# Patient Record
Sex: Female | Born: 1990 | Hispanic: Yes | Marital: Single | Smoking: Former smoker
Health system: Southern US, Community
[De-identification: ages and names within clinical notes are randomized; demographics above are authoritative.]

## PROBLEM LIST (undated history)

## (undated) DIAGNOSIS — Z862 Personal history of diseases of the blood and blood-forming organs and certain disorders involving the immune mechanism: Secondary | ICD-10-CM

## (undated) DIAGNOSIS — J45909 Unspecified asthma, uncomplicated: Secondary | ICD-10-CM

## (undated) DIAGNOSIS — Z8709 Personal history of other diseases of the respiratory system: Secondary | ICD-10-CM

## (undated) DIAGNOSIS — Z87440 Personal history of urinary (tract) infections: Secondary | ICD-10-CM

## (undated) DIAGNOSIS — F419 Anxiety disorder, unspecified: Secondary | ICD-10-CM

## (undated) HISTORY — PX: ROOT CANAL: SHX2363

## (undated) HISTORY — DX: Anxiety disorder, unspecified: F41.9

## (undated) HISTORY — DX: Personal history of diseases of the blood and blood-forming organs and certain disorders involving the immune mechanism: Z86.2

## (undated) HISTORY — DX: Personal history of other diseases of the respiratory system: Z87.09

## (undated) HISTORY — DX: Personal history of urinary (tract) infections: Z87.440

---
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
## 2012-02-07 ENCOUNTER — Emergency Department: Payer: Self-pay | Admitting: Emergency Medicine

## 2012-06-22 ENCOUNTER — Emergency Department: Payer: Self-pay | Admitting: Emergency Medicine

## 2012-06-22 LAB — URINALYSIS, COMPLETE
Bacteria: NONE SEEN
Glucose,UR: NEGATIVE mg/dL (ref 0–75)
Nitrite: NEGATIVE
Protein: 30
RBC,UR: 278 /HPF (ref 0–5)
Specific Gravity: 1.026 (ref 1.003–1.030)
Squamous Epithelial: 1
WBC UR: 19 /HPF (ref 0–5)

## 2012-06-22 LAB — PREGNANCY, URINE: Pregnancy Test, Urine: NEGATIVE m[IU]/mL

## 2012-06-23 LAB — CBC WITH DIFFERENTIAL/PLATELET
Basophil %: 0.8 %
HCT: 32.9 % — ABNORMAL LOW (ref 35.0–47.0)
HGB: 10.6 g/dL — ABNORMAL LOW (ref 12.0–16.0)
Lymphocyte %: 37.8 %
MCH: 23 pg — ABNORMAL LOW (ref 26.0–34.0)
Monocyte %: 7.3 %
Neutrophil #: 2.4 10*3/uL (ref 1.4–6.5)
Neutrophil %: 39.2 %
WBC: 6.1 10*3/uL (ref 3.6–11.0)

## 2012-06-23 LAB — BASIC METABOLIC PANEL
Calcium, Total: 8.8 mg/dL — ABNORMAL LOW (ref 9.0–10.7)
Co2: 26 mmol/L — ABNORMAL HIGH (ref 16–25)
EGFR (African American): >60
EGFR (Non-African Amer.): >60
Glucose: 88 mg/dL (ref 65–99)
Potassium: 3.3 mmol/L (ref 3.3–4.7)
Sodium: 139 mmol/L (ref 132–141)

## 2013-01-20 IMAGING — CT CT STONE STUDY
1 of 2 series · 15 of 32 positions shown, 20 images · non-contrast
Comparison: none

REASON FOR EXAM: pelvic pain with hematuria
COMMENTS:   LMP: > one month ago

[Series 2: 3mm soft tissue · axial · 0.60mm/px · z∈[-638,-276]mm · 15 of 133 slices shown, 20 images]
[im 6/133  soft-tissue]
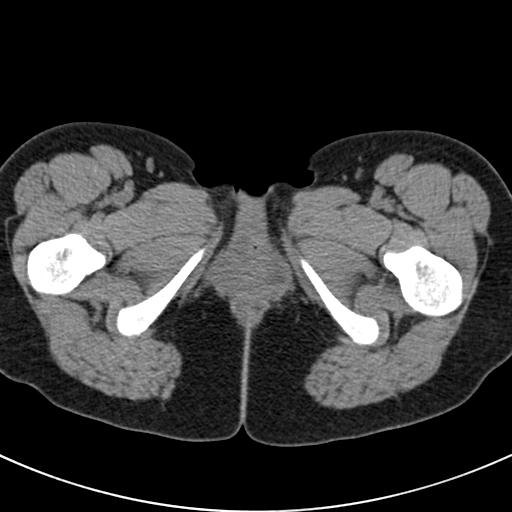
[im 6/133  bone]
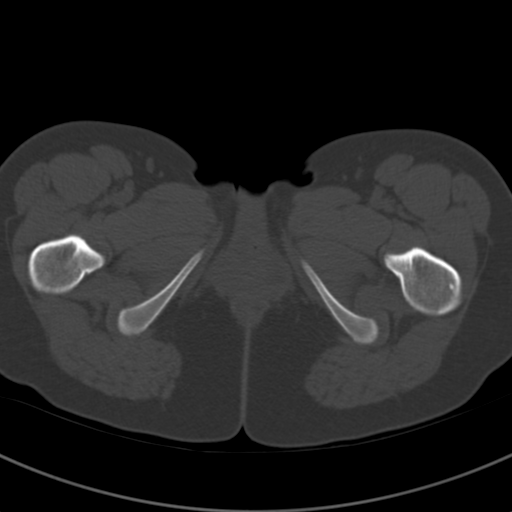
[im 18/133  soft-tissue]
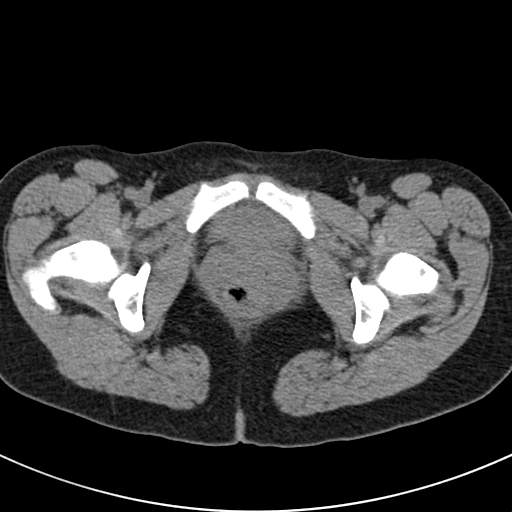
[im 23/133  soft-tissue]
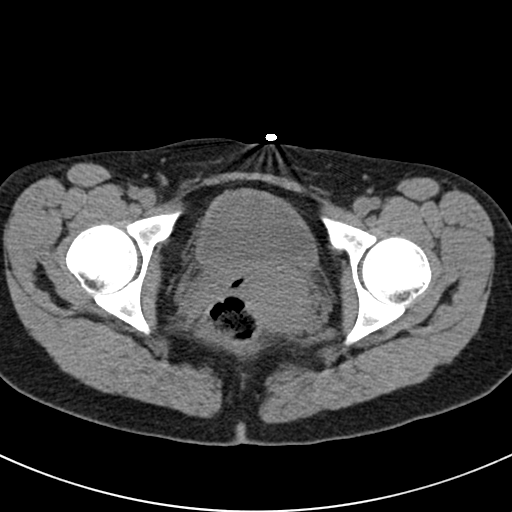
[im 35/133  soft-tissue]
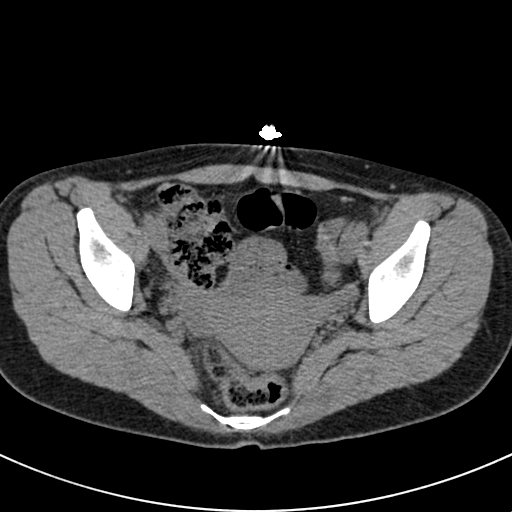
[im 46/133  soft-tissue]
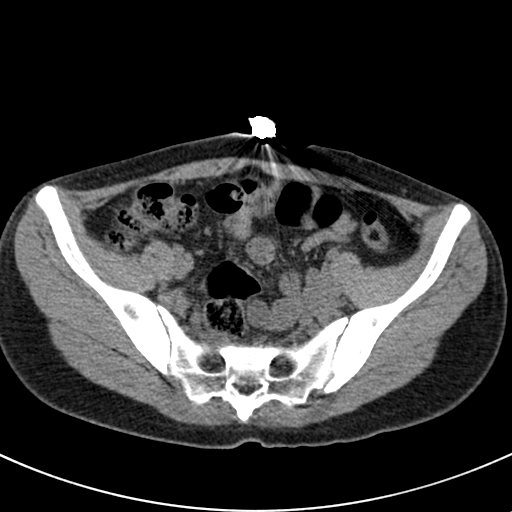
[im 52/133  soft-tissue]
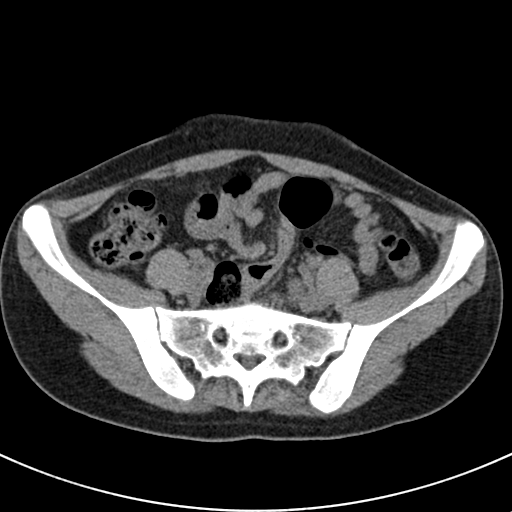
[im 64/133  soft-tissue]
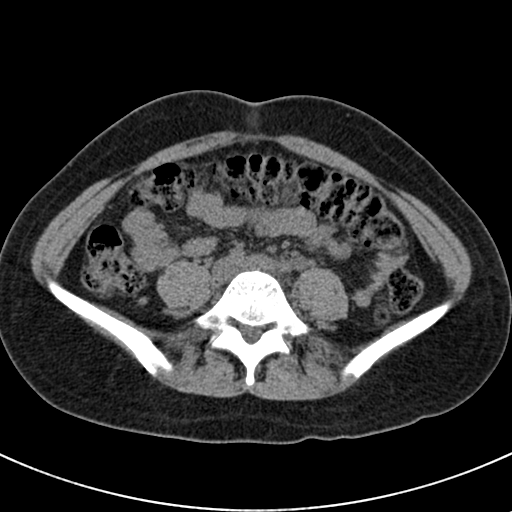
[im 69/133  soft-tissue]
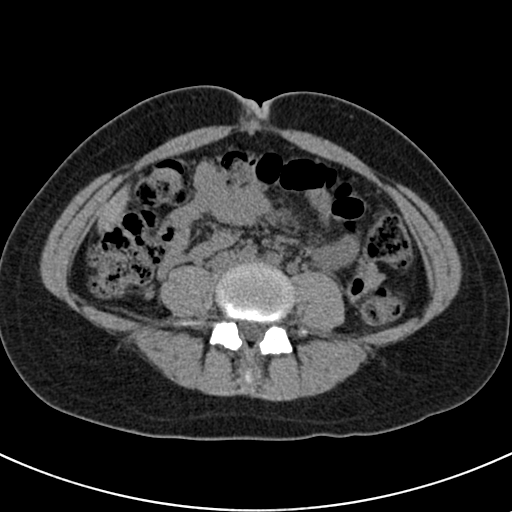
[im 81/133  soft-tissue]
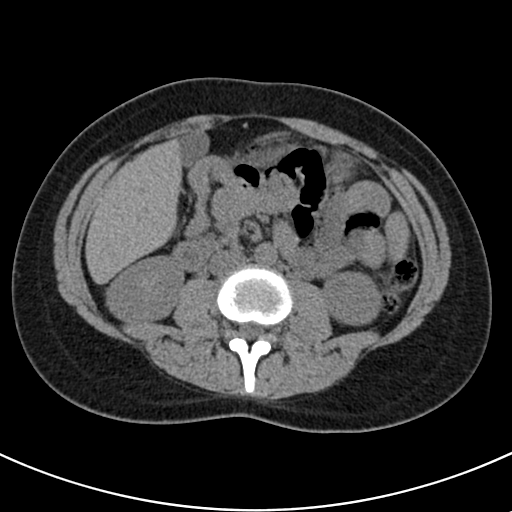
[im 81/133  bone]
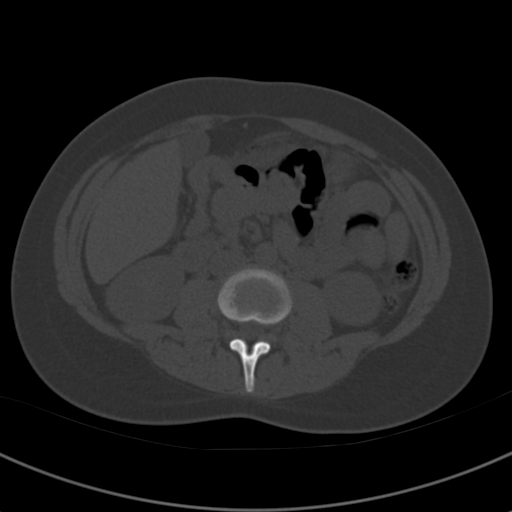
[im 87/133  soft-tissue]
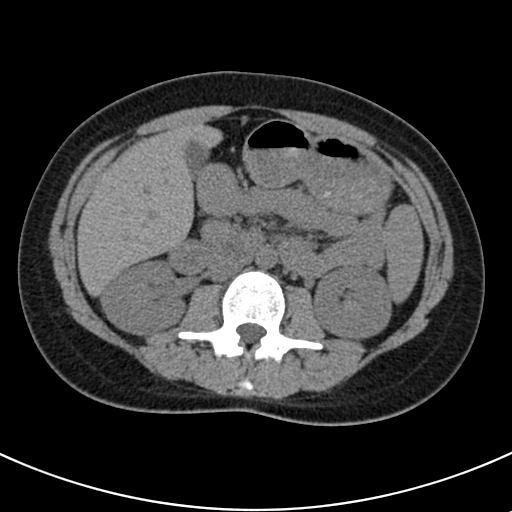
[im 98/133  soft-tissue]
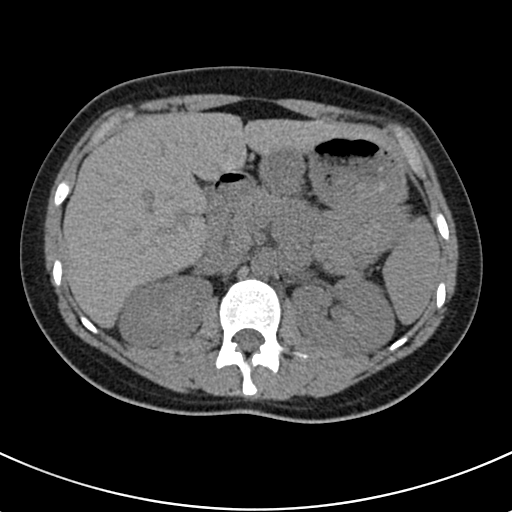
[im 110/133  soft-tissue]
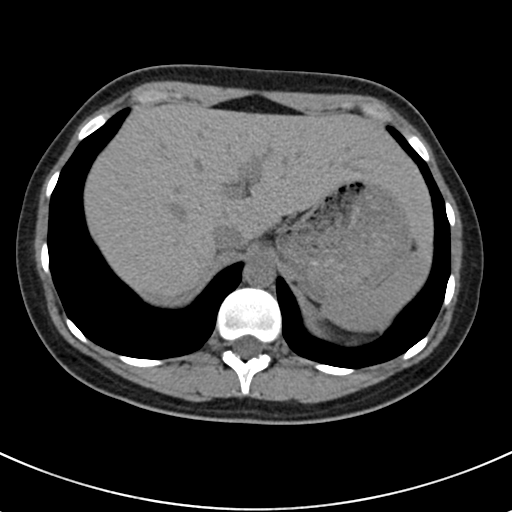
[im 110/133  lung]
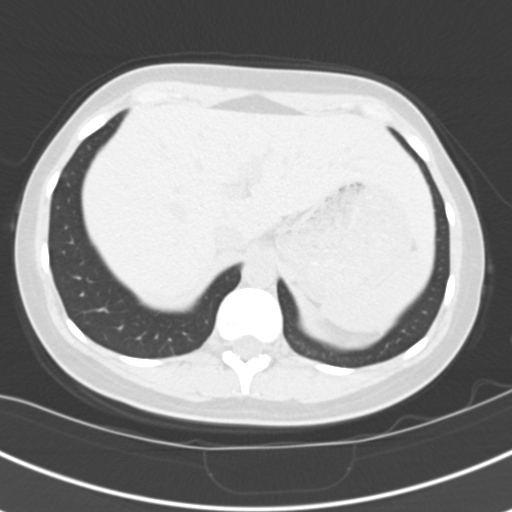
[im 115/133  soft-tissue]
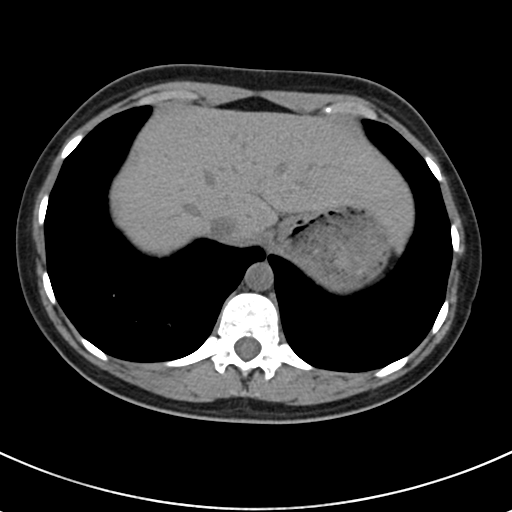
[im 115/133  lung]
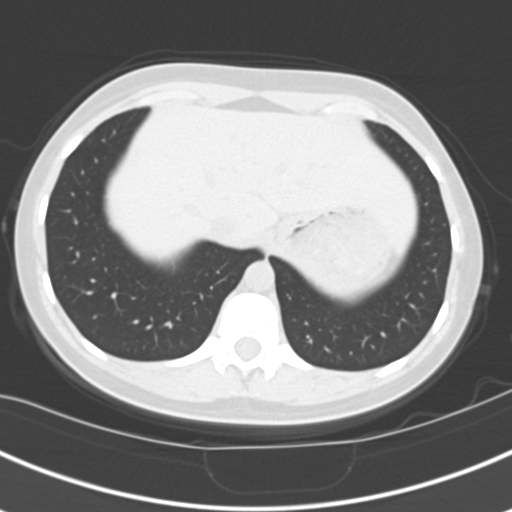
[im 121/133  lung]
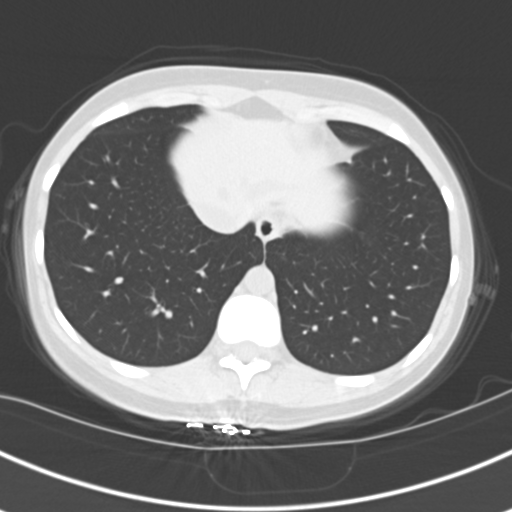
[im 127/133  soft-tissue]
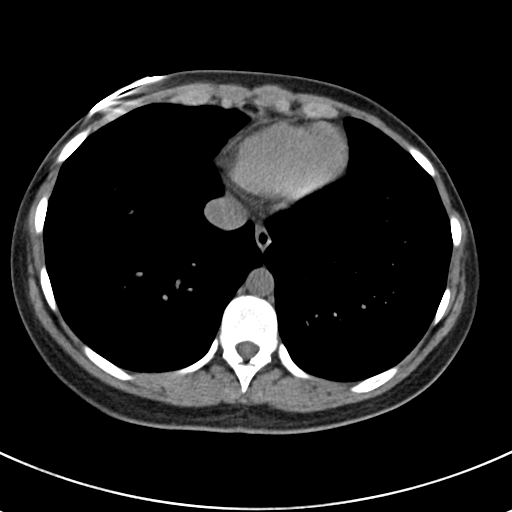
[im 127/133  lung]
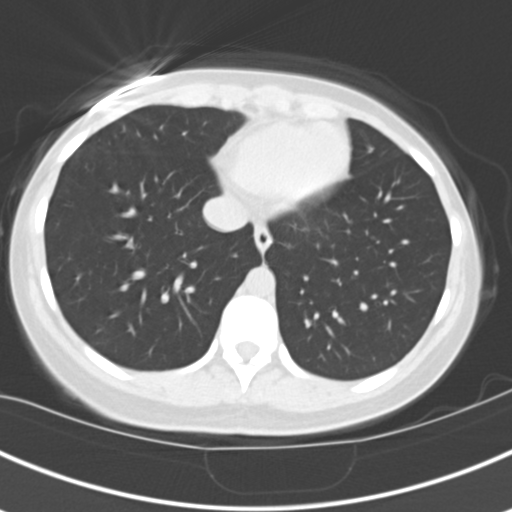

[15 of 32 positions shown; findings below may reference images not displayed]

PROCEDURE:     CT  - CT ABDOMEN /PELVIS WO (STONE)  - June 23, 2012  [DATE]

RESULT:     History: Pain and hematuria.

Comparison Study: No recent. Findings: Liver normal. Spleen normal. Pancreas
normal. Adrenals normal. Mild fullness of renal pelvis noted bilaterally. No
obstructing ureteral stone identified. Bladder nondistended. Uterus and
adnexa unremarkable. Bartholin's gland cyst of the left perineum. Stool in
colon. Appendix normal. No inflammatory changes left lower quadrant.
Gallbladder nondistended. No free air. Aorta unremarkable. Lung bases clear.
No free air.
IMPRESSION: No acute abnormality identified. Mild distention of renal
pelves noted. Bartholin's gland cyst left peroneum.

## 2016-12-01 ENCOUNTER — Emergency Department
Admission: EM | Admit: 2016-12-01 | Discharge: 2016-12-01 | Disposition: A | Discharge status: Home / Self Care | Payer: Self-pay | Attending: Emergency Medicine | Admitting: Emergency Medicine

## 2016-12-01 DIAGNOSIS — F410 Panic disorder [episodic paroxysmal anxiety] without agoraphobia: Secondary | ICD-10-CM | POA: Insufficient documentation

## 2016-12-01 MED ORDER — FERROUS SULFATE DRIED ER 160 (50 FE) MG PO TBCR: 160.0000 mg | EXTENDED_RELEASE_TABLET | ORAL | 6 refills | Status: DC

## 2016-12-01 MED ORDER — LORAZEPAM 0.5 MG PO TABS: 0.5000 mg | ORAL_TABLET | Freq: Three times a day (TID) | ORAL | 0 refills | Status: AC | PRN

## 2016-12-01 NOTE — ED Triage Notes (Signed)
Pt states she was at work today and felt like her heart was racing and felt SOB, states she then began having tingling to hands feet and face with hand cramping.. States she has had increased stress with starting a 2nd job . States same thing happened last month when she was going through custody battle of her child.Marland Kitchen

## 2016-12-01 NOTE — ED Provider Notes (Addendum)
[facility] Emergency Department Provider Note       Time seen: ----------------------------------------- 1:21 PM on 12/01/2016 -----------------------------------------     I have reviewed the triage vital signs and the nursing notes.   HISTORY   Chief Complaint Panic Attack    HPI Natasha Sloan is a 26 y.o. female who presents to the ED for heart racing and shortness of breath. Patient states she began having tingling in her hands and feet and face with cramping. Patient reports she's had increased stress with starting a second job. The same thing happened last month when she went through a custody battle with her child. Currently she is feeling better, she presents for further evaluation. Recently she is had very poor sleep at night.   History reviewed. No pertinent past medical history.  There are no active problems to display for this patient.   History reviewed. No pertinent surgical history.  Allergies Patient has no known allergies.  Social History Social History  Substance Use Topics  . Smoking status: Never Smoker  . Smokeless tobacco: Never Used  . Alcohol use Yes    Review of Systems Constitutional: Negative for fever. Cardiovascular: Negative for chest pain.Positive for palpitations Respiratory: Positive for shortness of breath Gastrointestinal: Negative for abdominal pain, vomiting and diarrhea. Genitourinary: Negative for dysuria. Musculoskeletal: Negative for back pain.Positive for cramping Skin: Negative for rash. Neurological: Negative for headaches, focal weakness or numbness. Positive for paresthesias and tingling  10-point ROS otherwise negative.  ____________________________________________   PHYSICAL EXAM:  VITAL SIGNS: ED Triage Vitals  Enc Vitals Group     BP 12/01/16 1009 127/82     Pulse Rate 12/01/16 1009 100     Resp 12/01/16 1009 18     Temp 12/01/16 1009 97.9 F (36.6 C)     Temp Source 12/01/16  1009 Oral     SpO2 12/01/16 1009 100 %     Weight 12/01/16 1010 120 lb (54.4 kg)     Height 12/01/16 1010  (1.6 m)     Head Circumference --      Peak Flow --      Pain Score --      Pain Loc --      Pain Edu? --      Excl. in GC? --     Constitutional: Alert and oriented. Well appearing and in no distress. Eyes: Conjunctivae are normal. PERRL. Normal extraocular movements. ENT   Head: Normocephalic and atraumatic.   Nose: No congestion/rhinnorhea.   Mouth/Throat: Mucous membranes are moist.   Neck: No stridor. Cardiovascular: Normal rate, regular rhythm. No murmurs, rubs, or gallops. Respiratory: Normal respiratory effort without tachypnea nor retractions. Breath sounds are clear and equal bilaterally. No wheezes/rales/rhonchi. Gastrointestinal: Soft and nontender. Normal bowel sounds Musculoskeletal: Nontender with normal range of motion in extremities. No lower extremity tenderness nor edema. Neurologic:  Normal speech and language. No gross focal neurologic deficits are appreciated.  Skin:  Skin is warm, dry and intact. No rash noted. Psychiatric: Mood and affect are normal. Speech and behavior are normal.  ____________________________________________  EKG: Interpreted by me. Sinus rhythm with sinus arrhythmia, rate is 88 bpm, normal PR interval, normal QRS, normal QT.  ____________________________________________  ED COURSE:  Pertinent labs & imaging results that were available during my care of the patient were reviewed by me and considered in my medical decision making (see chart for details). Patient presents for anxiety and likely panic attack, we will assess with labs and imaging  as indicated.   Procedures  ____________________________________________  FINAL ASSESSMENT AND PLAN  Panic attack  Plan: Patient had presented for symptoms of a panic attack which are currently resolved. I will prescribe Ativan she can try as needed. She is stable for  outpatient follow-up.   Emily Filbert, MD   Note: This note was generated in part or whole with voice recognition software. Voice recognition is usually quite accurate but there are transcription errors that can and very often do occur. I apologize for any typographical errors that were not detected and corrected.     Emily Filbert, MD 12/01/16 1323    Emily Filbert, MD 12/01/16 [phone]

## 2016-12-01 NOTE — ED Notes (Signed)
Pt states she is feeling better .

## 2017-07-11 ENCOUNTER — Emergency Department: Payer: Self-pay

## 2017-07-11 ENCOUNTER — Emergency Department
Admission: EM | Admit: 2017-07-11 | Discharge: 2017-07-12 | Disposition: A | Discharge status: Home / Self Care | Payer: Self-pay | Attending: Emergency Medicine | Admitting: Emergency Medicine

## 2017-07-11 DIAGNOSIS — R29898 Other symptoms and signs involving the musculoskeletal system: Secondary | ICD-10-CM | POA: Insufficient documentation

## 2017-07-11 DIAGNOSIS — Z79899 Other long term (current) drug therapy: Secondary | ICD-10-CM | POA: Insufficient documentation

## 2017-07-11 DIAGNOSIS — R064 Hyperventilation: Secondary | ICD-10-CM | POA: Insufficient documentation

## 2017-07-11 DIAGNOSIS — F419 Anxiety disorder, unspecified: Secondary | ICD-10-CM | POA: Insufficient documentation

## 2017-07-11 LAB — CBC WITH DIFFERENTIAL/PLATELET
BASOS ABS: 0 10*3/uL (ref 0–0.1)
BASOS PCT: 1 %
EOS ABS: 0.2 10*3/uL (ref 0–0.7)
EOS PCT: 4 %
HCT: 36.4 % (ref 35.0–47.0)
Hemoglobin: 12 g/dL (ref 12.0–16.0)
Lymphocytes Relative: 42 %
Lymphs Abs: 2.5 10*3/uL (ref 1.0–3.6)
MCH: 25 pg — ABNORMAL LOW (ref 26.0–34.0)
MCHC: 33 g/dL (ref 32.0–36.0)
MCV: 75.8 fL — ABNORMAL LOW (ref 80.0–100.0)
MONO ABS: 0.6 10*3/uL (ref 0.2–0.9)
Monocytes Relative: 10 %
Neutro Abs: 2.5 10*3/uL (ref 1.4–6.5)
Neutrophils Relative %: 43 %
PLATELETS: 262 10*3/uL (ref 150–440)
RBC: 4.8 MIL/uL (ref 3.80–5.20)
RDW: 16.8 % — AB (ref 11.5–14.5)
WBC: 5.9 10*3/uL (ref 3.6–11.0)

## 2017-07-11 LAB — BASIC METABOLIC PANEL
Anion gap: 10 (ref 5–15)
BUN: 16 mg/dL (ref 6–20)
CALCIUM: 8.9 mg/dL (ref 8.9–10.3)
CO2: 24 mmol/L (ref 22–32)
Chloride: 103 mmol/L (ref 101–111)
Creatinine, Ser: 0.7 mg/dL (ref 0.44–1.00)
GFR calc Af Amer: >60 mL/min (ref >60)
GLUCOSE: 96 mg/dL (ref 65–99)
Potassium: 3.5 mmol/L (ref 3.5–5.1)
SODIUM: 137 mmol/L (ref 135–145)

## 2017-07-11 LAB — HCG, QUANTITATIVE, PREGNANCY

## 2017-07-11 MED ORDER — LORAZEPAM 2 MG/ML IJ SOLN
INTRAMUSCULAR | Status: AC
  Administered 2017-07-11: 1 mg
  Filled 2017-07-11: qty 1

## 2017-07-11 MED ORDER — LORAZEPAM 2 MG/ML IJ SOLN
1.0000 mg | Freq: Once | INTRAMUSCULAR | Status: AC
  Administered 2017-07-11: 1 mg via INTRAVENOUS
  Filled 2017-07-11: qty 1

## 2017-07-11 MED ORDER — SODIUM CHLORIDE 0.9 % IV BOLUS (SEPSIS)
1000.0000 mL | Freq: Once | INTRAVENOUS | Status: AC
  Administered 2017-07-11: 1000 mL via INTRAVENOUS

## 2017-07-11 NOTE — ED Notes (Addendum)
Pt reports some improvement in symptoms. This RN is able to understand some speech. Jaw is still clenched and pt is still twitching lower jaw. Family at bedside.  Pt denies taking any other meds today. Pt took lorazepam in the past but finished it. Pt sometimes takes Benadryl.

## 2017-07-11 NOTE — ED Provider Notes (Addendum)
[facility] Emergency Department Provider Note ____________________________________________   First MD Initiated Contact with Patient 07/11/17 2127     (approximate)  I have reviewed the triage vital signs and the nursing notes.   HISTORY  Chief Complaint Weakness    HPI Natasha Sloan is a 26 y.o. female no significant past medical history who presents with weakness, acute onset within the last few hours, associated with and preceded by anxiety, and associated with rigidity.  Patient states that she began feeling anxious, and then became stiff, and had difficulty opening her mouth or speaking.  Patient reports numbness and tingling to her face and around her mouth, as well as to her extremities.  She denies chest pain, difficulty breathing, or headache.  She has a prior history of anxiety, but no prior episodes like this.   No past medical history on file.  There are no active problems to display for this patient.   No past surgical history on file.  Prior to Admission medications   Medication Sig Start Date End Date Taking? Authorizing Provider  ferrous sulfate (EQL SLOW RELEASE IRON) 160 (50 Fe) MG TBCR SR tablet Take 1 tablet (160 mg total) by mouth every other day. 12/01/16   Emily FilbertWilliams, Jonathan E, MD  LORazepam (ATIVAN) 0.5 MG tablet Take 1 tablet (0.5 mg total) by mouth every 8 (eight) hours as needed for anxiety. 12/01/16 12/01/17  Emily FilbertWilliams, Jonathan E, MD    Allergies Patient has no known allergies.  No family history on file.  Social History Social History   Tobacco Use  . Smoking status: Never Smoker  . Smokeless tobacco: Never Used  Substance Use Topics  . Alcohol use: Yes  . Drug use: Not on file    Review of Systems  Constitutional: No fever. Eyes: No visual changes. ENT: No sore throat. Cardiovascular: Denies chest pain. Respiratory: Denies shortness of breath. Gastrointestinal: No nausea, no vomiting.   Genitourinary:  Negative for flank pain.  Musculoskeletal: Negative for back pain. Skin: Negative for rash. Neurological: Negative for headache.   ____________________________________________   PHYSICAL EXAM:  VITAL SIGNS: ED Triage Vitals  Enc Vitals Group     BP 07/11/17 2127 (!) 157/85     Pulse Rate 07/11/17 2127 (!) 107     Resp 07/11/17 2127 (!) 24     Temp --      Temp src --      SpO2 07/11/17 2127 100 %     Weight 07/11/17 2126 140 lb (63.5 kg)     Height 07/11/17 2126 5\' 2"  (1.575 m)     Head Circumference --      Peak Flow --      Pain Score --      Pain Loc --      Pain Edu? --      Excl. in GC? --     Constitutional: Alert.  Anxious appearing. Eyes: Conjunctivae are normal.  EOMI.  PERRLA.  Head: Atraumatic. Nose: No congestion/rhinnorhea. Mouth/Throat: Mucous membranes are moist.   Neck: Normal range of motion.  Cardiovascular: Tachycardic, regular rhythm. Grossly normal heart sounds.  Good peripheral circulation. Respiratory: Normal respiratory effort.  No retractions. Lungs CTAB. Gastrointestinal: Abdomen nontender, no distention.  Genitourinary: No flank tenderness. Musculoskeletal:  Extremities warm and well perfused.  Neurologic:  Following all commands.  Motor intact in all extremities.  Patient holding extremities rigidly extended but is able to squeeze with hands and push with feet bilaterally.  No facial droop;  patient having some difficulty opening mouth fully and some dysarthria but moving facial muscle symmetrically.   Skin:  Skin is warm and dry. No rash noted. Psychiatric: Anxious appearing.    ____________________________________________   LABS (all labs ordered are listed, but only abnormal results are displayed)  Labs Reviewed  CBC WITH DIFFERENTIAL/PLATELET - Abnormal; Notable for the following components:      Result Value   MCV 75.8 (*)    MCH 25.0 (*)    RDW 16.8 (*)    All other components within normal limits  BASIC METABOLIC PANEL  HCG,  QUANTITATIVE, PREGNANCY  BLOOD GAS, VENOUS  POC URINE PREG, ED   ____________________________________________  EKG  ED ECG REPORT I, Dionne BucySebastian Montae Stager, the attending physician, personally viewed and interpreted this ECG.  Date: 07/12/2017 EKG Time: 12:11 Rate: 81 Rhythm: normal sinus rhythm QRS Axis: normal Intervals: normal ST/T Wave abnormalities: normal Narrative Interpretation: no evidence of acute ischemia  ____________________________________________  RADIOLOGY  CT head: No ICH or other acute findings ____________________________________________   PROCEDURES  Procedure(s) performed: No    Critical Care performed: No ____________________________________________   INITIAL IMPRESSION / ASSESSMENT AND PLAN / ED COURSE  Pertinent labs & imaging results that were available during my care of the patient were reviewed by me and considered in my medical decision making (see chart for details).  26 year old female with history of anxiety but no other significant past medical history presents with acute onset of weakness and rigidity, in the context of anxiety.  She endorses feeling anxious for some time, and then the other symptoms began.  She reports tingling and numbness around her mouth, and tingling in the extremities.  Review of past medical records in Epic is noncontributory.  No prior history of similar episodes.  On exam, vital signs are normal except for tachycardia, the patient is anxious appearing, and the neuro exam is as described above; the patient is holding her extremities rigidly extended, and appears to have some difficulty opening her mouth with resulting dysarthria, however she is able to voluntarily move all extremities, she is alert and following commands, and there is no facial asymmetry or any apparent weakness.  The presentation is most consistent with an acute anxiety attack, with likely resulting hyperventilation and hypocapnia that is causing  perioral numbness and extremity symptoms.  Differential also includes partial seizure, however given that all extremities involved the patient is alert and following commands, this is less likely.  There is no evidence of acute CVA again given that patient is moving all extremities symmetrically, has no change in mental status, and no facial droop.    Plan: I will treat symptomatically with IV Ativan, fluids, obtain labs including VBG, and in case this is a first-time new onset seizure will obtain a CT head and EKG as part of normal seizure workup.  If negative and symptoms resolve, likely d/c home.     ----------------------------------------- 11:53 PM on 07/11/2017 -----------------------------------------  Patient's speech returned to normal, and her rigidity improved.  Patient is now asleep but arousable.  Awaiting CT and labs.  ----------------------------------------- 12:29 AM on 07/12/2017 -----------------------------------------  Lab workup and CT are negative.  Patient is still somewhat sleepy after the Ativan.  We will watch her for a bit longer, and when she is awake and alert and able to ambulate unassisted we will likely discharge home if she remains asymptomatic.  Signed out to oncoming physician Dr. Zenda AlpersWebster.  ____________________________________________   FINAL CLINICAL IMPRESSION(S) / ED DIAGNOSES  Final diagnoses:  Anxiety  Hyperventilation  Rigidity      NEW MEDICATIONS STARTED DURING THIS VISIT:  This SmartLink is deprecated. Use AVSMEDLIST instead to display the medication list for a patient.   Note:  This document was prepared using Dragon voice recognition software and may include unintentional dictation errors.      Dionne Bucy, MD 07/12/17 0030

## 2017-07-11 NOTE — ED Triage Notes (Signed)
Pt taken to room one from triage. Pt appears to be having seizure like activity in lobby. Pt is not able to communicate to staff and has rigidity noted to all extremities. Family states pt with history of panic attacks, but no hyperventilation present before episode began. On arrival to room pt states she is scared and is able to follow commands intermittently. No loss of bowel or bladder noted.

## 2017-07-11 NOTE — ED Notes (Signed)
Pt back from CT. Pt sleeping.

## 2017-07-12 NOTE — ED Provider Notes (Signed)
-----------------------------------------   1:07 AM on 07/12/2017 -----------------------------------------   Blood pressure 132/80, pulse (!) 107, resp. rate (!) 23, height 5\' 2"  (1.575 m), weight 63.5 kg (140 lb), last menstrual period 07/11/2017, SpO2 100 %.  Assuming care from Dr. Marisa SeverinSiadecki.  In short, Natasha Sloan is a 26 y.o. female with a chief complaint of Weakness .  Refer to the original H&P for additional details.  The current plan of care is to reassess and disposition the patient.     The patient is feeling improved. She will be discharged home to follow up with her primary care physician.   Rebecka ApleyWebster, Consuela Widener P, MD 07/12/17 [phone]

## 2017-07-12 NOTE — Discharge Instructions (Signed)
Return to the emergency department for new, worsening, or persistent weakness, numbness, difficulty speaking or moving, severe headache, spasm or tremor, or any other new or worsening symptoms that concern you.  Follow-up with your primary care doctor within the next week.

## 2017-07-12 NOTE — ED Notes (Signed)
Pt family asking if they could take pt home. Pt is still drowsy. Will wake and answer questions. MD wants pt to wait about 30 minutes before d/c. Family verbalized understanding.

## 2017-12-13 ENCOUNTER — Emergency Department
Admission: EM | Admit: 2017-12-13 | Discharge: 2017-12-13 | Disposition: A | Discharge status: Home / Self Care | Payer: 59 | Attending: Emergency Medicine | Admitting: Emergency Medicine

## 2017-12-13 ENCOUNTER — Emergency Department: Payer: 59

## 2017-12-13 ENCOUNTER — Encounter: Payer: Self-pay | Admitting: *Deleted

## 2017-12-13 ENCOUNTER — Other Ambulatory Visit: Payer: Self-pay

## 2017-12-13 DIAGNOSIS — Y92009 Unspecified place in unspecified non-institutional (private) residence as the place of occurrence of the external cause: Secondary | ICD-10-CM

## 2017-12-13 DIAGNOSIS — Z79899 Other long term (current) drug therapy: Secondary | ICD-10-CM | POA: Insufficient documentation

## 2017-12-13 DIAGNOSIS — Y999 Unspecified external cause status: Secondary | ICD-10-CM | POA: Insufficient documentation

## 2017-12-13 DIAGNOSIS — S46911A Strain of unspecified muscle, fascia and tendon at shoulder and upper arm level, right arm, initial encounter: Secondary | ICD-10-CM | POA: Diagnosis not present

## 2017-12-13 DIAGNOSIS — Y92008 Other place in unspecified non-institutional (private) residence as the place of occurrence of the external cause: Secondary | ICD-10-CM | POA: Insufficient documentation

## 2017-12-13 DIAGNOSIS — W108XXA Fall (on) (from) other stairs and steps, initial encounter: Secondary | ICD-10-CM | POA: Diagnosis not present

## 2017-12-13 DIAGNOSIS — S5001XA Contusion of right elbow, initial encounter: Secondary | ICD-10-CM | POA: Diagnosis not present

## 2017-12-13 DIAGNOSIS — W19XXXA Unspecified fall, initial encounter: Secondary | ICD-10-CM

## 2017-12-13 DIAGNOSIS — S4981XA Other specified injuries of right shoulder and upper arm, initial encounter: Secondary | ICD-10-CM | POA: Diagnosis present

## 2017-12-13 DIAGNOSIS — Y9389 Activity, other specified: Secondary | ICD-10-CM | POA: Diagnosis not present

## 2017-12-13 MED ORDER — HYDROCODONE-ACETAMINOPHEN 5-325 MG PO TABS
1.0000 | ORAL_TABLET | Freq: Once | ORAL | Status: AC
  Administered 2017-12-13: 1 via ORAL
  Filled 2017-12-13: qty 1

## 2017-12-13 MED ORDER — MELOXICAM 15 MG PO TABS: 15.0000 mg | ORAL_TABLET | Freq: Every day | ORAL | 0 refills | Status: DC

## 2017-12-13 NOTE — ED Provider Notes (Signed)
[facility] Emergency Department Provider Note  ____________________________________________  Time seen: Approximately 5:15 PM  I have reviewed the triage vital signs and the nursing notes.   HISTORY  Chief Complaint Arm Injury    HPI Natasha Sloan is a 27 y.o. female who presents the emergency department complaining of right shoulder and right elbow pain.  Patient reports that she was going up her deck last night, her shoe got caught on 1 of the steps causing her to fall forward.  Patient reports that she landed on a bent elbow and has had sharp pain to the elbow, humerus region, right shoulder since.  Patient did not hit her head or lose consciousness.  She is tried Tylenol and Motrin with no relief of pain.  Patient has limited range of motion due to pain.  In the interview, patient is holding her injured extremity with her unaffected extremity. No other injury or complaint  No past medical history on file.  There are no active problems to display for this patient.   No past surgical history on file.  Prior to Admission medications   Medication Sig Start Date End Date Taking? Authorizing Provider  ferrous sulfate (EQL SLOW RELEASE IRON) 160 (50 Fe) MG TBCR SR tablet Take 1 tablet (160 mg total) by mouth every other day. 12/01/16   Emily Filbert, MD  meloxicam (MOBIC) 15 MG tablet Take 1 tablet (15 mg total) by mouth daily. 12/13/17   Cuthriell, Delorise Royals, PA-C    Allergies Patient has no known allergies.  No family history on file.  Social History Social History   Tobacco Use  . Smoking status: Never Smoker  . Smokeless tobacco: Never Used  Substance Use Topics  . Alcohol use: Yes  . Drug use: Never     Review of Systems  Constitutional: No fever/chills Eyes: No visual changes.  Cardiovascular: no chest pain. Respiratory: no cough. No SOB. Gastrointestinal: No abdominal pain.  No nausea, no vomiting.   Musculoskeletal: Positive for  right elbow, right shoulder pain. Skin: Negative for rash, abrasions, lacerations, ecchymosis. Neurological: Negative for headaches, focal weakness or numbness. 10-point ROS otherwise negative.  ____________________________________________   PHYSICAL EXAM:  VITAL SIGNS: ED Triage Vitals  Enc Vitals Group     BP 12/13/17 1701 (!) 128/53     Pulse Rate 12/13/17 1701 94     Resp 12/13/17 1701 18     Temp 12/13/17 1701 98.4 F (36.9 C)     Temp Source 12/13/17 1701 Oral     SpO2 12/13/17 1701 98 %     Weight 12/13/17 1702 130 lb (59 kg)     Height 12/13/17 1702 5\' 1"  (1.549 m)     Head Circumference --      Peak Flow --      Pain Score 12/13/17 1704 10     Pain Loc --      Pain Edu? --      Excl. in GC? --      Constitutional: Alert and oriented. Well appearing and in no acute distress. Eyes: Conjunctivae are normal. PERRL. EOMI. Head: Atraumatic. Neck: No stridor.  No cervical spine tenderness to palpation.  Cardiovascular: Normal rate, regular rhythm. Normal S1 and S2.  Good peripheral circulation. Respiratory: Normal respiratory effort without tachypnea or retractions. Lungs CTAB. Good air entry to the bases with no decreased or absent breath sounds. Musculoskeletal: Full range of motion to all extremities. No gross deformities appreciated.  No visible deformity or  gross edema to the elbow, humerus, right shoulder.  Patient has limited range of motion to the shoulder and elbow the right side.  Palpation reveals global tenderness to the shoulder with no specific point tenderness or palpable abnormality.  Special tests not performed prior to imaging.  Examination of the elbow reveals tenderness over the olecranon process with no palpable abnormality.  No other tenderness to palpation.  Patient will extend, flex, supinate and pronate elbow.  Examination of the wrist and cervical spine is unremarkable.  Radial pulse intact distally.  Sensation intact distally. Neurologic:  Normal  speech and language. No gross focal neurologic deficits are appreciated.  Skin:  Skin is warm, dry and intact. No rash noted. Psychiatric: Mood and affect are normal. Speech and behavior are normal. Patient exhibits appropriate insight and judgement.   ____________________________________________   LABS (all labs ordered are listed, but only abnormal results are displayed)  Labs Reviewed - No data to display ____________________________________________  EKG   ____________________________________________  RADIOLOGY Festus BarrenI, Jonathan D Cuthriell, personally viewed and evaluated these images (plain radiographs) as part of my medical decision making, as well as reviewing the written report by the radiologist.  Concur with radiologist finding of no acute osseous abnormality to the shoulder, humerus, elbow.  Dg Shoulder Right  Result Date: 12/13/2017 CLINICAL DATA:  27 y/o F; trip and fall. Right humerus and shoulder pain. EXAM: RIGHT SHOULDER - 2+ VIEW; RIGHT HUMERUS - 2+ VIEW; RIGHT ELBOW - COMPLETE 3+ VIEW COMPARISON:  None. FINDINGS: Right shoulder: There is no evidence of fracture or dislocation. There is no evidence of arthropathy or other focal bone abnormality. Soft tissues are unremarkable. Right humerus: There is no evidence of fracture or dislocation. There is no evidence of arthropathy or other focal bone abnormality. Soft tissues are unremarkable. Right elbow: There is no evidence of fracture or dislocation. There is no evidence of arthropathy or other focal bone abnormality. Soft tissues are unremarkable. IMPRESSION: Negative. Electronically Signed   By: Mitzi HansenLance  Furusawa-Stratton M.D.   On: 12/13/2017 18:16   Dg Elbow Complete Right  Result Date: 12/13/2017 CLINICAL DATA:  27 y/o F; trip and fall. Right humerus and shoulder pain. EXAM: RIGHT SHOULDER - 2+ VIEW; RIGHT HUMERUS - 2+ VIEW; RIGHT ELBOW - COMPLETE 3+ VIEW COMPARISON:  None. FINDINGS: Right shoulder: There is no evidence of  fracture or dislocation. There is no evidence of arthropathy or other focal bone abnormality. Soft tissues are unremarkable. Right humerus: There is no evidence of fracture or dislocation. There is no evidence of arthropathy or other focal bone abnormality. Soft tissues are unremarkable. Right elbow: There is no evidence of fracture or dislocation. There is no evidence of arthropathy or other focal bone abnormality. Soft tissues are unremarkable. IMPRESSION: Negative. Electronically Signed   By: Mitzi HansenLance  Furusawa-Stratton M.D.   On: 12/13/2017 18:16   Dg Humerus Right  Result Date: 12/13/2017 CLINICAL DATA:  27 y/o F; trip and fall. Right humerus and shoulder pain. EXAM: RIGHT SHOULDER - 2+ VIEW; RIGHT HUMERUS - 2+ VIEW; RIGHT ELBOW - COMPLETE 3+ VIEW COMPARISON:  None. FINDINGS: Right shoulder: There is no evidence of fracture or dislocation. There is no evidence of arthropathy or other focal bone abnormality. Soft tissues are unremarkable. Right humerus: There is no evidence of fracture or dislocation. There is no evidence of arthropathy or other focal bone abnormality. Soft tissues are unremarkable. Right elbow: There is no evidence of fracture or dislocation. There is no evidence of arthropathy or other focal  bone abnormality. Soft tissues are unremarkable. IMPRESSION: Negative. Electronically Signed   By: Mitzi Hansen M.D.   On: 12/13/2017 18:16    ____________________________________________    PROCEDURES  Procedure(s) performed:    Procedures    Medications  HYDROcodone-acetaminophen (NORCO/VICODIN) 5-325 MG per tablet 1 tablet (1 tablet Oral Given 12/13/17 1754)     ____________________________________________   INITIAL IMPRESSION / ASSESSMENT AND PLAN / ED COURSE  Pertinent labs & imaging results that were available during my care of the patient were reviewed by me and considered in my medical decision making (see chart for details).  Review of the [place] CSRS was  performed in accordance of the NCM[facility] prior to dispensing any controlled drugs.     Patient's diagnosis is consistent with fall resulting in contusion of the right elbow strain of the right shoulder.  Patient presented with ongoing pain after a fall at home last night.  Differential included dislocation of the shoulder or elbow, fractures, strains, contusion.  X-ray reveals no acute patient is given sling for comfort.. Patient will be discharged home with prescriptions for meloxicam for symptom control. Patient is to follow up with orthopedics as needed or otherwise directed. Patient is given ED precautions to return to the ED for any worsening or new symptoms.     ____________________________________________  FINAL CLINICAL IMPRESSION(S) / ED DIAGNOSES  Final diagnoses:  Fall in home, initial encounter  Contusion of right elbow, initial encounter  Strain of right shoulder, initial encounter      NEW MEDICATIONS STARTED DURING THIS VISIT:  ED Discharge Orders        Ordered    meloxicam (MOBIC) 15 MG tablet  Daily     12/13/17 1828          This chart was dictated using voice recognition software/Dragon. Despite best efforts to proofread, errors can occur which can change the meaning. Any change was purely unintentional.    Racheal Patches, PA-C 12/13/17 Florene Glen, MD 12/13/17 2356

## 2017-12-13 NOTE — ED Triage Notes (Signed)
Pt tripped and fell going up the steps.  Pt has right arm and shoulder pain.  No loc  Pt alert.

## 2018-03-18 LAB — HM PAP SMEAR: HM Pap smear: NEGATIVE

## 2018-03-18 LAB — HM HIV SCREENING LAB: HM HIV Screening: NEGATIVE

## 2018-03-29 DIAGNOSIS — F4310 Post-traumatic stress disorder, unspecified: Secondary | ICD-10-CM | POA: Insufficient documentation

## 2018-07-09 ENCOUNTER — Other Ambulatory Visit: Payer: Self-pay

## 2018-07-09 ENCOUNTER — Encounter: Payer: Self-pay | Admitting: Emergency Medicine

## 2018-07-09 ENCOUNTER — Emergency Department: Payer: 59

## 2018-07-09 ENCOUNTER — Emergency Department
Admission: EM | Admit: 2018-07-09 | Discharge: 2018-07-09 | Disposition: A | Discharge status: Home / Self Care | Payer: 59 | Attending: Emergency Medicine | Admitting: Emergency Medicine

## 2018-07-09 DIAGNOSIS — W010XXA Fall on same level from slipping, tripping and stumbling without subsequent striking against object, initial encounter: Secondary | ICD-10-CM | POA: Insufficient documentation

## 2018-07-09 DIAGNOSIS — Y929 Unspecified place or not applicable: Secondary | ICD-10-CM | POA: Diagnosis not present

## 2018-07-09 DIAGNOSIS — Y998 Other external cause status: Secondary | ICD-10-CM | POA: Diagnosis not present

## 2018-07-09 DIAGNOSIS — Y939 Activity, unspecified: Secondary | ICD-10-CM | POA: Diagnosis not present

## 2018-07-09 DIAGNOSIS — S4992XA Unspecified injury of left shoulder and upper arm, initial encounter: Secondary | ICD-10-CM | POA: Diagnosis present

## 2018-07-09 DIAGNOSIS — S42022A Displaced fracture of shaft of left clavicle, initial encounter for closed fracture: Secondary | ICD-10-CM | POA: Diagnosis not present

## 2018-07-09 DIAGNOSIS — Z79899 Other long term (current) drug therapy: Secondary | ICD-10-CM | POA: Diagnosis not present

## 2018-07-09 MED ORDER — OXYCODONE-ACETAMINOPHEN 5-325 MG PO TABS: 1.0000 | ORAL_TABLET | Freq: Four times a day (QID) | ORAL | 0 refills | Status: AC | PRN

## 2018-07-09 MED ORDER — OXYCODONE-ACETAMINOPHEN 5-325 MG PO TABS
1.0000 | ORAL_TABLET | Freq: Once | ORAL | Status: AC
  Administered 2018-07-09: 1 via ORAL
  Filled 2018-07-09: qty 1

## 2018-07-09 MED ORDER — IBUPROFEN 800 MG PO TABS: 800.0000 mg | ORAL_TABLET | Freq: Three times a day (TID) | ORAL | 0 refills | Status: DC | PRN

## 2018-07-09 NOTE — ED Triage Notes (Signed)
Fell last night about 11pm, pain L collarbone.

## 2018-07-09 NOTE — ED Notes (Signed)
Pt transported to XR.  

## 2018-07-09 NOTE — ED Notes (Signed)
Pt c/o left shoulder pain after fall last night, pt states the pain is the same as it was yesterday, decreased ROM due to pain.

## 2018-07-09 NOTE — ED Provider Notes (Signed)
[facility] Emergency Department Provider Note  ____________________________________________  Time seen: Approximately 5:53 PM  I have reviewed the triage vital signs and the nursing notes.   HISTORY  Chief Complaint Shoulder Pain    HPI Natasha Sloan is a 27 y.o. female presents emergency department for evaluation of left clavicle pain after falling yesterday.  Patient was leaning on a railing and did not realize it was unsteady, which caused her to fall.  She landed on her left side.  She thought area was just sore last night.  She states that she can hear the bone moving when she moves her left arm.  She took Benadryl last night to help her sleep.  No shortness of breath.  No additional injuries. No numbness, tingling.   History reviewed. No pertinent past medical history.  There are no active problems to display for this patient.   History reviewed. No pertinent surgical history.  Prior to Admission medications   Medication Sig Start Date End Date Taking? Authorizing Provider  ferrous sulfate (EQL SLOW RELEASE IRON) 160 (50 Fe) MG TBCR SR tablet Take 1 tablet (160 mg total) by mouth every other day. 12/01/16   Emily Filbert, MD  ibuprofen (ADVIL,MOTRIN) 800 MG tablet Take 1 tablet (800 mg total) by mouth every 8 (eight) hours as needed. 07/09/18   Enid Derry, PA-C  meloxicam (MOBIC) 15 MG tablet Take 1 tablet (15 mg total) by mouth daily. 12/13/17   Cuthriell, Delorise Royals, PA-C  oxyCODONE-acetaminophen (PERCOCET) 5-325 MG tablet Take 1 tablet by mouth every 6 (six) hours as needed for up to 3 days for severe pain. 07/09/18 07/12/18  Enid Derry, PA-C    Allergies Patient has no known allergies.  No family history on file.  Social History Social History   Tobacco Use  . Smoking status: Never Smoker  . Smokeless tobacco: Never Used  Substance Use Topics  . Alcohol use: Yes  . Drug use: Never     Review of Systems  Cardiovascular: No  chest pain. Respiratory: No SOB. Gastrointestinal:  No nausea, no vomiting.  Musculoskeletal: Positive for clavicle pain.  Skin: Negative for rash, abrasions, lacerations, ecchymosis. Neurological: Negative for numbness or tingling   ____________________________________________   PHYSICAL EXAM:  VITAL SIGNS: ED Triage Vitals  Enc Vitals Group     BP 07/09/18 1700 124/86     Pulse Rate 07/09/18 1700 (!) 110     Resp 07/09/18 1700 18     Temp 07/09/18 1700 97.9 F (36.6 C)     Temp Source 07/09/18 1700 Oral     SpO2 07/09/18 1700 99 %     Weight 07/09/18 1702 120 lb (54.4 kg)     Height 07/09/18 1701 5\' 3"  (1.6 m)     Head Circumference --      Peak Flow --      Pain Score 07/09/18 1701 10     Pain Loc --      Pain Edu? --      Excl. in GC? --      Constitutional: Alert and oriented. Well appearing and in no acute distress. Eyes: Conjunctivae are normal. PERRL. EOMI. Head: Atraumatic. ENT:      Ears:      Nose: No congestion/rhinnorhea.      Mouth/Throat: Mucous membranes are moist.  Neck: No stridor.  Cardiovascular: Normal rate, regular rhythm.  Good peripheral circulation. Respiratory: Normal respiratory effort without tachypnea or retractions. Lungs CTAB. Good air entry to the  bases with no decreased or absent breath sounds. Musculoskeletal: Full range of motion to all extremities. No gross deformities appreciated. Tenderness to palpation over mid clavicle.  No tenderness to palpation to left shoulder.  Full range of motion of shoulder. Neurologic:  Normal speech and language. No gross focal neurologic deficits are appreciated.  Skin:  Skin is warm, dry and intact. No rash noted. Psychiatric: Mood and affect are normal. Speech and behavior are normal. Patient exhibits appropriate insight and judgement.   ____________________________________________   LABS (all labs ordered are listed, but only abnormal results are displayed)  Labs Reviewed - No data to  display ____________________________________________  EKG   ____________________________________________  RADIOLOGY Lexine Baton, personally viewed and evaluated these images (plain radiographs) as part of my medical decision making, as well as reviewing the written report by the radiologist.  Dg Clavicle Left  Result Date: 07/09/2018 CLINICAL DATA:  Fall with left shoulder pain. EXAM: LEFT CLAVICLE - 2+ VIEWS COMPARISON:  None. FINDINGS: Displaced fracture in the mid left clavicle. Medial fragment is displaced superiorly approximately 2 cm. Normal alignment at the left [facility] joint. Visualized ribs are intact. Negative for a large pneumothorax. IMPRESSION: Displaced fracture in the mid left clavicle. Electronically Signed   By: Richarda Overlie M.D.   On: 07/09/2018 17:48    ____________________________________________    PROCEDURES  Procedure(s) performed:    Procedures    Medications  oxyCODONE-acetaminophen (PERCOCET/ROXICET) 5-325 MG per tablet 1 tablet (1 tablet Oral Given 07/09/18 1813)     ____________________________________________   INITIAL IMPRESSION / ASSESSMENT AND PLAN / ED COURSE  Pertinent labs & imaging results that were available during my care of the patient were reviewed by me and considered in my medical decision making (see chart for details).  Review of the [place] CSRS was performed in accordance of the NCMB prior to dispensing any controlled drugs.    Patient's diagnosis is consistent with displaced clavicle fracture. Patient will be discharged home with prescriptions for a short course of percocet. Patient is to follow up with orthopedics as directed. Patient is given ED precautions to return to the ED for any worsening or new symptoms.     ____________________________________________  FINAL CLINICAL IMPRESSION(S) / ED DIAGNOSES  Final diagnoses:  Closed displaced fracture of shaft of left clavicle, initial encounter      NEW MEDICATIONS  STARTED DURING THIS VISIT:  ED Discharge Orders         Ordered    ibuprofen (ADVIL,MOTRIN) 800 MG tablet  Every 8 hours PRN     07/09/18 1809    oxyCODONE-acetaminophen (PERCOCET) 5-325 MG tablet  Every 6 hours PRN     07/09/18 1809              This chart was dictated using voice recognition software/Dragon. Despite best efforts to proofread, errors can occur which can change the meaning. Any change was purely unintentional.    Enid Derry, PA-C 07/09/18 Yvonna Alanis, MD 07/09/18 2044

## 2018-07-09 NOTE — Discharge Instructions (Signed)
Your x-ray shows a mid clavicle fracture.  Please wear clavicle strap.  Take Percocet and ibuprofen as needed for pain.  Follow-up with orthopedics on Monday or Tuesday next week.  Wear clavicle strap until seen by orthopedics.

## 2018-12-01 ENCOUNTER — Telehealth: Payer: 59 | Admitting: Family

## 2018-12-01 DIAGNOSIS — R059 Cough, unspecified: Secondary | ICD-10-CM

## 2018-12-01 DIAGNOSIS — R05 Cough: Secondary | ICD-10-CM

## 2018-12-01 MED ORDER — PROMETHAZINE-DM 6.25-15 MG/5ML PO SYRP: 5.0000 mL | ORAL_SOLUTION | Freq: Four times a day (QID) | ORAL | 0 refills | Status: DC | PRN

## 2018-12-01 NOTE — Progress Notes (Signed)

## 2018-12-05 ENCOUNTER — Telehealth: Payer: 59 | Admitting: Family

## 2018-12-05 DIAGNOSIS — R6889 Other general symptoms and signs: Principal | ICD-10-CM

## 2018-12-05 DIAGNOSIS — Z20822 Contact with and (suspected) exposure to covid-19: Secondary | ICD-10-CM

## 2018-12-05 NOTE — Progress Notes (Signed)
Based on what you shared with me, I feel your condition warrants further evaluation and I recommend that you be seen for a face to face office visit.  Given your symptoms and you still have frequent shortness of breath, you need to be seen face to face to be evaluated.    NOTE: If you entered your credit card information for this eVisit, you will not be charged. You may see a "hold" on your card for the $35 but that hold will drop off and you will not have a charge processed.  If you are having a true medical emergency please call 911.  If you need an urgent face to face visit, [place] has four urgent care centers for your convenience.    PLEASE NOTE: THE [facility] LOCATIONS AND URGENT CARE CLINICS DO NOT HAVE THE TESTING FOR CORONAVIRUS COVID19 AVAILABLE.  IF YOU FEEL YOU NEED THIS TEST YOU MUST GO TO A TRIAGE LOCATION AT ONE OF THE HOSPITAL EMERGENCY DEPARTMENTS   [email or web address] to reserve your spot online an avoid wait times  [facility] [address], [address] [place], [place] [address] Modified hours of operation: Monday-Friday, 10 AM to 6 PM  Saturday & Sunday 10 AM to 4 PM *Across the street from [employer]  [employer] (New Address!) [address], [address] [place], [place] [address] *Just off [employer], across the road from [place]* Modified hours of operation: Monday-Friday, 10 AM to 5 PM  Closed Saturday & Sunday   The following sites will take your insurance:  . [facility]  [phone] Get Driving Directions Find a Provider at this Location  [address] [place], [place] [address] . 10 am to 8 pm Monday-Friday . 12 pm to 8 pm Saturday-Sunday   . [facility] at [facility]  [phone] Get Driving Directions Find a Provider at this Location  [address], [address] [place], [place] [address] . 8 am to 8 pm Monday-Friday . 9 am to 6 pm Saturday . 11  am to 6 pm Sunday   . [facility] at [facility]  [phone] Get Driving Directions  [address] [place], [place] [address] . 8 am to 8 pm Monday-Friday . 8 am to 4 pm Saturday-Sunday   Your e-visit answers were reviewed by a board certified advanced clinical practitioner to complete your personal care plan.  Thank you for using e-Visits.

## 2019-04-13 ENCOUNTER — Telehealth: Payer: Self-pay | Admitting: Family Medicine

## 2019-04-13 DIAGNOSIS — F4310 Post-traumatic stress disorder, unspecified: Secondary | ICD-10-CM

## 2019-04-13 NOTE — Telephone Encounter (Signed)
TC to patient who requests nexplanon removal. Nexplanon inserted 07/01/2015. Patient wants to switch to OC. Patient scheduled for nexplanon Britt Bottom, RN

## 2019-04-14 ENCOUNTER — Ambulatory Visit (LOCAL_COMMUNITY_HEALTH_CENTER): Payer: Self-pay | Admitting: Nurse Practitioner

## 2019-04-14 ENCOUNTER — Other Ambulatory Visit: Payer: Self-pay

## 2019-04-14 ENCOUNTER — Encounter: Payer: Self-pay | Admitting: Nurse Practitioner

## 2019-04-14 VITALS — BP 105/72 | Ht 63.0 in | Wt 134.4 lb

## 2019-04-14 DIAGNOSIS — N760 Acute vaginitis: Secondary | ICD-10-CM

## 2019-04-14 DIAGNOSIS — Z3009 Encounter for other general counseling and advice on contraception: Secondary | ICD-10-CM

## 2019-04-14 DIAGNOSIS — Z113 Encounter for screening for infections with a predominantly sexual mode of transmission: Secondary | ICD-10-CM

## 2019-04-14 DIAGNOSIS — Z3046 Encounter for surveillance of implantable subdermal contraceptive: Secondary | ICD-10-CM

## 2019-04-14 DIAGNOSIS — B9689 Other specified bacterial agents as the cause of diseases classified elsewhere: Secondary | ICD-10-CM

## 2019-04-14 LAB — PREGNANCY, URINE: Preg Test, Ur: NEGATIVE

## 2019-04-14 LAB — WET PREP FOR TRICH, YEAST, CLUE
Trichomonas Exam: NEGATIVE
Yeast Exam: NEGATIVE

## 2019-04-14 MED ORDER — NORGESTIM-ETH ESTRAD TRIPHASIC 0.18/0.215/0.25 MG-35 MCG PO TABS: 1.0000 | ORAL_TABLET | Freq: Every day | ORAL | 11 refills | Status: DC

## 2019-04-14 MED ORDER — METRONIDAZOLE 500 MG PO TABS: 500.0000 mg | ORAL_TABLET | Freq: Two times a day (BID) | ORAL | 0 refills | Status: AC

## 2019-04-14 NOTE — Progress Notes (Signed)
In for Nexplanon removal; desires O.C.;s; desires STD screening Debera Lat, RN

## 2019-04-14 NOTE — Addendum Note (Signed)
Addended by: Debera Lat on: 04/14/2019 05:36 PM   Modules accepted: Orders

## 2019-04-14 NOTE — Progress Notes (Addendum)
STI clinic/screening visit and Nexplanon removal  Subjective:  Natasha Sloan is a 28 y.o. female being seen today for an STI screening visit and Nexplanon removal (inserted 06/2015). The patient reports they do not have symptoms.  Patient has the following medical conditions:   Patient Active Problem List   Diagnosis Date Noted  . R/O Posttraumatic stress disorder 03/29/2018  . Asthma 09/09/2011     No chief complaint on file.   HPI  Patient reports LMP 03/05/19 and new partner x2 mo.  Denies hx blood clots, cancer, migraine h/a, stroke, MI and wants ocp's after Nexplanon removed  See flowsheet for further details and programmatic requirements.    The following portions of the patient's history were reviewed and updated as appropriate: allergies, current medications, past medical history, past social history, past surgical history and problem list.  Objective:   Vitals:   04/14/19 1537  BP: 105/72  Weight: 134 lb 6.4 oz (61 kg)  Height: 5\' 3"  (1.6 m)    Physical Exam Vitals signs and nursing note reviewed.  Constitutional:      Appearance: Normal appearance.  HENT:     Head: Normocephalic and atraumatic.     Mouth/Throat:     Mouth: Mucous membranes are moist.     Pharynx: Oropharynx is clear. No oropharyngeal exudate or posterior oropharyngeal erythema.  Eyes:     Conjunctiva/sclera: Conjunctivae normal.  Pulmonary:     Effort: Pulmonary effort is normal.  Abdominal:     General: Abdomen is flat.     Palpations: There is no mass.     Tenderness: There is no abdominal tenderness. There is no rebound.  Genitourinary:    General: Normal vulva.     Exam position: Lithotomy position.     Pubic Area: No rash or pubic lice.      Labia:        Right: No rash or lesion.        Left: No rash or lesion.      Vagina: Vaginal discharge (white adherrent, ph>4.5 with sl malodor) present. No erythema, bleeding or lesions.     Cervix: Normal.     Uterus: Normal.    Adnexa: Right adnexa normal and left adnexa normal.     Rectum: Normal.  Lymphadenopathy:     Head:     Right side of head: No preauricular or posterior auricular adenopathy.     Left side of head: No preauricular or posterior auricular adenopathy.     Cervical: No cervical adenopathy.     Upper Body:     Right upper body: No supraclavicular or axillary adenopathy.     Left upper body: No supraclavicular or axillary adenopathy.     Lower Body: No right inguinal adenopathy. No left inguinal adenopathy.  Skin:    General: Skin is warm and dry.     Findings: No rash.  Neurological:     Mental Status: She is alert and oriented to person, place, and time.  Psychiatric:        Mood and Affect: Mood normal.        Behavior: Behavior normal.       Assessment and Plan:  Natasha Sloan is a 28 y.o. female presenting to the [facility] Department for STI screening  1. Family planning services  Starting ocp's for birth control method.  Discussed to take daily at same time daily and no coitus x 7-10 days  2. Screening examination for STD (sexually transmitted  disease)  Await results - HIV [facility] [place] [facility] - Chlamydia/Gonorrhea [place] [facility] - Syphilis Serology, [place] [facility] - WET PREP FOR TRICH, YEAST, CLUE--provider to review   3. Encounter for Nexplanon removal  - Pregnancy, urine neg Nexplanon Removal Patient identified, informed consent performed, consent signed.   Appropriate time out taken. Nexplanon site identified.  Area prepped in usual sterile fashon. 2 ml of 1% lidocaine with Epinephrine was used to anesthetize the area at the distal end of the implant and along implant site in left arm. A small stab incision was made right beside the implant on the distal portion.  The Nexplanon rod was grasped using curved hemostats/manual and removed without difficulty.  There was minimal blood loss. There were no complications.  Steri-strips were applied over the small incision.  A  pressure bandage was applied to reduce any bruising.  The patient tolerated the procedure well and was given post procedure instructions.   Nexplanon:   Counseled patient to take OTC analgesic starting as soon as lidocaine starts to wear off and take regularly for at least 48 hr to decrease discomfort.  Specifically to take with food or milk to decrease stomach upset and for IB 600 mg (3 tablets) every 6 hrs; IB 800 mg (4 tablets) every 8 hrs; or Aleve 2 tablets every 12 hrs.   4. Bacterial vaginosis--treat for BV per standing orders  No follow-ups on file.  No future appointments.  Alberteen SpindleElizabeth A Klinton Candelas, CNM

## 2019-04-14 NOTE — Addendum Note (Signed)
Addended by: Donnal Moat on: 04/14/2019 05:20 PM   Modules accepted: Level of Service

## 2019-04-14 NOTE — Addendum Note (Signed)
Addended by: Jerline Pain on: 04/14/2019 04:24 PM   Modules accepted: Orders

## 2019-07-20 ENCOUNTER — Telehealth: Payer: Self-pay | Admitting: Family Medicine

## 2019-07-20 NOTE — Telephone Encounter (Signed)
Phone call to pt. Pt desires Nexplanon insertion. Stopped taking OCP at the beginning of Nov 2020. Pt states her last sex was on 07/08/2019 without a condom. Does not have any insurance. Pt counseled to continue to abstain from sex until at least 7 days after Nexplanon insertion, but if she is sexually active to use a back-up method like condoms with all sex. Pt scheduled for 07/24/2019 Nexplanon insertion.

## 2019-07-20 NOTE — Telephone Encounter (Signed)
Patient want nexplanon °

## 2019-07-24 ENCOUNTER — Ambulatory Visit (LOCAL_COMMUNITY_HEALTH_CENTER): Payer: Self-pay | Admitting: Physician Assistant

## 2019-07-24 ENCOUNTER — Other Ambulatory Visit: Payer: Self-pay

## 2019-07-24 VITALS — BP 134/75 | Ht 63.0 in | Wt 137.0 lb

## 2019-07-24 DIAGNOSIS — Z3200 Encounter for pregnancy test, result unknown: Secondary | ICD-10-CM

## 2019-07-24 DIAGNOSIS — Z3009 Encounter for other general counseling and advice on contraception: Secondary | ICD-10-CM

## 2019-07-24 DIAGNOSIS — Z30017 Encounter for initial prescription of implantable subdermal contraceptive: Secondary | ICD-10-CM

## 2019-07-24 LAB — PREGNANCY, URINE: Preg Test, Ur: NEGATIVE

## 2019-07-24 MED ORDER — ETONOGESTREL 68 MG ~~LOC~~ IMPL
68.0000 mg | DRUG_IMPLANT | Freq: Once | SUBCUTANEOUS | Status: AC
  Administered 2019-07-24: 68 mg via SUBCUTANEOUS

## 2019-07-24 NOTE — Progress Notes (Signed)
Pt states last sex was 07/17/2019 without a condom, and last LMP was approximately 06/17/2019 (normal).

## 2019-07-25 NOTE — Progress Notes (Signed)
[facility]  [facility]  Subjective:  Natasha Sloan is a 28 y.o. being seen today requesting Nexplanon insertion.  Chief Complaint  Patient presents with  . Contraception    desires Nexplanon insertion    HPI Patient here today requesting Nexplanon insertion.  States that she has used Nexplanon in the past and prefers this method over OCs.  States that she d/c OCs at the end of [place] of November. Stated to RN that last sex was without condoms on 07/17/2019, but in speaking with patient she states that she got mixed up and told the RN the wrong date and really her last sex was 07/10/2019, and without condoms.  Per chart, last RP was 02/2018 and pap and CBE are due in 2022.   Does the patient have a current or past history of drug use? No   No components found for: HCV]   Health Maintenance Due  Topic Date Due  . TETANUS/TDAP  07/07/2012  . INFLUENZA VACCINE  04/01/2019    Review of Systems  All other systems reviewed and are negative.   The following portions of the patient's history were reviewed and updated as appropriate: allergies, current medications, past family history, past medical history, past social history, past surgical history and problem list. Problem list updated.   See flowsheet for other program required questions.  Objective:   Vitals:   07/24/19 1132  BP: 134/75  Weight: 137 lb (62.1 kg)  Height: 5\' 3"  (1.6 m)    Physical Exam Constitutional:      General: She is not in acute distress.    Appearance: Normal appearance.  HENT:     Head: Normocephalic and atraumatic.  Pulmonary:     Effort: Pulmonary effort is normal.  Neurological:     Mental Status: She is alert and oriented to person, place, and time.  Psychiatric:        Mood and Affect: Mood normal.        Behavior: Behavior normal.        Thought Content: Thought content normal.        Judgment: Judgment normal.        Assessment and Plan:  Natasha Sloan is a 28 y.o. female presenting to the [facility] Department for a Women's Health problem visit  1. Family planning services RN sent patient to lab for pregnancy test. - Pregnancy, urine - etonogestrel (NEXPLANON) implant 68 mg  2. Encounter for pregnancy test, result unknown Pregnancy test negative today.  - Pregnancy, urine  3. Encounter for counseling regarding contraception Counseled patient that it is important that she is not pregnant prior to putting in the Nexplanon and that is why we ask questions multiple times re:  Last period and last sex. Counseled patient that the Nexplanon is expensive and the reason we make sure someone is not pregnant prior to placement so that we do not have to remove it after a few weeks. Stressed to patient that she needs to use condoms with all sex for 2 weeks and repeat a pregnancy test at home in 2 weeks.  RTC if that pregnancy test is positive. - etonogestrel (NEXPLANON) implant 68 mg  4. Nexplanon insertion Nexplanon Insertion Procedure Patient identified, informed consent performed, consent signed.   Patient does understand that irregular bleeding is a very common side effect of this medication. She was advised to have backup contraception after placement. Patient was determined to meet WHO criteria for  not being pregnant. Appropriate time out taken.  The insertion site was identified 8-10 cm (3-4 inches) from the medial epicondyle of the humerus and 3-5 cm (1.25-2 inches) posterior to (below) the sulcus (groove) between the biceps and triceps muscles of the patient's Left arm and marked.  Patient was prepped with alcohol swab and then injected with 3 ml of 1% lidocaine.  Arm was prepped with chlorhexidene, Nexplanon removed from packaging,  Device confirmed in needle, then inserted full length of needle and withdrawn per handbook instructions. Nexplanon was able to palpated in the patient's arm;  patient palpated the insert herself. There was minimal blood loss.  Patient insertion site covered with guaze and a pressure bandage to reduce any bruising.  The patient tolerated the procedure well and was given post procedure instructions.  - etonogestrel (NEXPLANON) implant 68 mg  Nexplanon:   Counseled patient to take OTC analgesic starting as soon as lidocaine starts to wear off and take regularly for at least 48 hr to decrease discomfort.  Specifically to take with food or milk to decrease stomach upset and for IB 600 mg (3 tablets) every 6 hrs; IB 800 mg (4 tablets) every 8 hrs; or Aleve 2 tablets every 12 hrs.     Return in about 6 weeks (around 09/04/2019) for physical.  No future appointments.  Jerene Dilling, PA

## 2019-08-29 ENCOUNTER — Telehealth: Payer: Self-pay | Admitting: Family Medicine

## 2019-08-29 NOTE — Telephone Encounter (Signed)
This RN called pt at phone # provided. Pt reports that she is having issues with having long periods with Nexplanon. Nexplanon was placed 07/24/2019. Pt reports she is on day 18 of her menstraul period and that it was heavy at first but is getting lighter now. Pt reports she has tried Ibuprofen that did not help. Offered for pt to come in to be seen to discuss with provider of what might help with menstrual bleeding and pt states she would like to come in to be seen. Pt reports she also desires STD screening. FP Prob visit scheduled for 08/30/2019 at 8:20am per pt request. Pt aware that they will be able to do STD screening during her visit. Pt aware of appt date and time and to arrive early for check-in. Pt with no other questions or concerns at this time.Ronny Bacon, RN

## 2019-08-29 NOTE — Telephone Encounter (Signed)
HAS ISSUES WITH HER BC

## 2019-08-30 ENCOUNTER — Ambulatory Visit (LOCAL_COMMUNITY_HEALTH_CENTER): Payer: Self-pay | Admitting: Family Medicine

## 2019-08-30 ENCOUNTER — Encounter: Payer: Self-pay | Admitting: Family Medicine

## 2019-08-30 ENCOUNTER — Other Ambulatory Visit: Payer: Self-pay

## 2019-08-30 VITALS — BP 109/66 | Ht 63.0 in | Wt 137.8 lb

## 2019-08-30 DIAGNOSIS — D508 Other iron deficiency anemias: Secondary | ICD-10-CM

## 2019-08-30 DIAGNOSIS — Z113 Encounter for screening for infections with a predominantly sexual mode of transmission: Secondary | ICD-10-CM

## 2019-08-30 DIAGNOSIS — N939 Abnormal uterine and vaginal bleeding, unspecified: Secondary | ICD-10-CM

## 2019-08-30 DIAGNOSIS — B9689 Other specified bacterial agents as the cause of diseases classified elsewhere: Secondary | ICD-10-CM

## 2019-08-30 DIAGNOSIS — N760 Acute vaginitis: Secondary | ICD-10-CM

## 2019-08-30 LAB — WET PREP FOR TRICH, YEAST, CLUE
Trichomonas Exam: NEGATIVE
Yeast Exam: NEGATIVE

## 2019-08-30 LAB — HEMOGLOBIN, FINGERSTICK: Hemoglobin: 9.1 g/dL — ABNORMAL LOW (ref 11.1–15.9)

## 2019-08-30 MED ORDER — METRONIDAZOLE 500 MG PO TABS: 500.0000 mg | ORAL_TABLET | Freq: Two times a day (BID) | ORAL | 0 refills | Status: AC

## 2019-08-30 MED ORDER — IRON (FERROUS SULFATE) 325 (65 FE) MG PO TABS: 1.0000 | ORAL_TABLET | ORAL | 0 refills | Status: DC

## 2019-08-30 MED ORDER — IRON (FERROUS SULFATE) 325 (65 FE) MG PO TABS: 1.0000 | ORAL_TABLET | Freq: Two times a day (BID) | ORAL | 0 refills | Status: DC

## 2019-08-30 MED ORDER — NORETHIN ACE-ETH ESTRAD-FE 1-20 MG-MCG(24) PO TABS: 1.0000 | ORAL_TABLET | Freq: Every day | ORAL | 0 refills | Status: DC

## 2019-08-30 NOTE — Progress Notes (Signed)
Wet mount reviewed, pt treated for BV per standing order. Hgb reviewed, pt treated for anemia per standing order. Provider orders completed.

## 2019-08-30 NOTE — Progress Notes (Signed)
[place] problem visit  [place] Department  Subjective:  Natasha Sloan is a 28 y.o. being seen today for   Chief Complaint  Patient presents with  . Contraception    c/o vaginal bleeding with nexplanon  . SEXUALLY TRANSMITTED DISEASE    STD screening, no bloodwork    HPI  Here for excessive menstrual bleeding with Nexplanon. Nexplanon placed 07/24/19, period started Dec 10-19 then slowed to light bleed from Dec 20-25 then spotting from Dec 25-today. She has had Nexplanon in the past, had excessive bleeding then as well. Has tried ibuprofen 600mg  every 4 hours, maybe helped slow bleed a little. Denies cramping. Has had some weakness, denies dizziness or fainting. She did take home pregnancy test 2 wks after Nexplanon placed, result negative.   Would also like STI testing. Uses condoms sometimes. 2 partners in past 2 months. Has possibly had discharge (hard to tell with bleeding), different odor. Denies fever, rash, n/v.    Does the patient have a current or past history of drug use? No   No components found for: HCV]   Health Maintenance Due  Topic Date Due  . TETANUS/TDAP  07/07/2012  . INFLUENZA VACCINE  04/01/2019    ROS  The following portions of the patient's history were reviewed and updated as appropriate: allergies, current medications, past family history, past medical history, past social history, past surgical history and problem list. Problem list updated.   See flowsheet for other program required questions.  Objective:   Vitals:   08/30/19 0836  BP: 109/66  Weight: 137 lb 12.8 oz (62.5 kg)  Height: 5\' 3"  (1.6 m)    Physical Exam Vitals and nursing note reviewed.  Constitutional:      General: She is not in acute distress.    Appearance: Normal appearance. She is not ill-appearing.  HENT:     Head: Normocephalic and atraumatic.     Mouth/Throat:     Mouth: Mucous membranes are moist.     Pharynx: Oropharynx is  clear. No oropharyngeal exudate or posterior oropharyngeal erythema.  Pulmonary:     Effort: Pulmonary effort is normal.  Abdominal:     General: Abdomen is flat.     Palpations: There is no mass.     Tenderness: There is no abdominal tenderness. There is no rebound.  Genitourinary:    General: Normal vulva.     Exam position: Lithotomy position.     Pubic Area: No rash or pubic lice.      Labia:        Right: No rash or lesion.        Left: No rash or lesion.      Vagina: Bleeding (dark brown blood in vault, ph>4.5) present. No vaginal discharge, erythema or lesions.     Cervix: Cervical bleeding present. No cervical motion tenderness, discharge, friability, lesion or erythema.     Uterus: Normal.      Adnexa: Right adnexa normal and left adnexa normal.     Rectum: Normal.  Lymphadenopathy:     Head:     Right side of head: No preauricular or posterior auricular adenopathy.     Left side of head: No preauricular or posterior auricular adenopathy.     Cervical: No cervical adenopathy.     Upper Body:     Right upper body: No supraclavicular or axillary adenopathy.     Left upper body: No supraclavicular or axillary adenopathy.     Lower  Body: No right inguinal adenopathy. No left inguinal adenopathy.  Skin:    General: Skin is warm and dry.     Findings: No rash.  Neurological:     Mental Status: She is alert and oriented to person, place, and time.       Assessment and Plan:  Natasha Sloan is a 28 y.o. female presenting to the [facility] Department for a Women's Health problem visit  Patient with current LARC device complaining of irregular bleeding. Has a Nexplanon. Counseled on options which include watchful waiting, scheduled NSAIDs, OCP for limited time, removal. Counseled on the the normality of bleeding with method and typical bleeding patterns with LARC method.   1. Vaginal bleeding -Will try adding OCP to Nexplanon x3 months. Advised to RTC if this does  not help, bleeding persists. -Hgb resulted 9.1, treated with PO iron per standing order, see RN note. Advised to go to ER if feeling very faint/dizzy or bleeding worsens. - Norethindrone Acetate-Ethinyl Estrad-FE (LOESTRIN 24 FE) 1-20 MG-MCG(24) tablet; Take 1 tablet by mouth daily.  Dispense: 3 Package; Refill: 0 - Hemoglobin, venipuncture - WET PREP FOR TRICH, YEAST, CLUE  2. Screening examination for venereal disease -Screenings today as below. Treat wet prep per standing order. -Patient does not meet criteria for HepB, HepC Screening. Declines HIV and syphilis screenings. -Counseled on warning s/sx and when to seek care. Recommended condom use with all sex and discussed importance of condom use for STI prevention. - Chlamydia/Gonorrhea [facility]   Return in about 4 weeks (around 09/27/2019) for Hgb recheck.   Ann Held, PA-C

## 2019-08-30 NOTE — Progress Notes (Signed)
Pt states she is having problems with vaginal bleeding with Nexplanon; has already tried Ibuprofen regimen; willing to try birth control pills to get bleeding to stop. Pt states she also wants STD testing.

## 2019-09-12 ENCOUNTER — Other Ambulatory Visit: Payer: Self-pay

## 2019-09-12 ENCOUNTER — Ambulatory Visit: Payer: Self-pay

## 2019-09-12 ENCOUNTER — Telehealth: Payer: Self-pay

## 2019-09-12 DIAGNOSIS — A749 Chlamydial infection, unspecified: Secondary | ICD-10-CM

## 2019-09-12 MED ORDER — AZITHROMYCIN 500 MG PO TABS
1000.0000 mg | ORAL_TABLET | Freq: Once | ORAL | Status: AC
  Administered 2019-09-12: 16:00:00 1000 mg via ORAL

## 2019-09-12 NOTE — Telephone Encounter (Signed)
TC to patient. Verified ID via password/SS#. Informed of positive chlamydia and need for tx. Instructed to eat before visit and have partner call for tx appt. Appt scheduled.Square Jowett, RN    

## 2019-09-14 ENCOUNTER — Telehealth: Payer: Self-pay | Admitting: Family Medicine

## 2019-09-14 NOTE — Telephone Encounter (Signed)
TC to [employer]. Rx called in for [facility] 1 gm po once, no refills per C. Hampton PA VO. Instructed patient to eat and take Benedryl 30-1 hr before taking antibiotic. Verbalized understanding.  Richmond Campbell, RN

## 2019-09-14 NOTE — Telephone Encounter (Signed)
TC with patient. Reports vomited after tx appt on Tuesday.  Requesting medication be called in d/t our limited schedule and work restrictions. RN will consult with provider. Richmond Campbell, RN

## 2019-09-14 NOTE — Telephone Encounter (Signed)
Patient asked to speak to nurse regarding std

## 2019-11-08 ENCOUNTER — Ambulatory Visit (LOCAL_COMMUNITY_HEALTH_CENTER): Payer: Medicaid Other | Admitting: Family Medicine

## 2019-11-08 ENCOUNTER — Other Ambulatory Visit: Payer: Self-pay

## 2019-11-08 VITALS — BP 120/78 | Ht 63.0 in | Wt 142.2 lb

## 2019-11-08 DIAGNOSIS — Z30011 Encounter for initial prescription of contraceptive pills: Secondary | ICD-10-CM

## 2019-11-08 DIAGNOSIS — Z3009 Encounter for other general counseling and advice on contraception: Secondary | ICD-10-CM | POA: Diagnosis not present

## 2019-11-08 DIAGNOSIS — Z3046 Encounter for surveillance of implantable subdermal contraceptive: Secondary | ICD-10-CM | POA: Diagnosis not present

## 2019-11-08 MED ORDER — CRYSELLE-28 0.3-30 MG-MCG PO TABS: 1.0000 | ORAL_TABLET | Freq: Every day | ORAL | 0 refills | Status: DC

## 2019-11-08 NOTE — Progress Notes (Signed)
   [facility]  Subjective:  Natasha Sloan is a 29 y.o. being seen today for   Chief Complaint  Patient presents with  . Contraception    desires nexplanon removal, wants OCP    HPI Client here for Nexplanon removal and start OCPs.   Does the patient have a current or past history of drug use? No   No components found for: HCV]   Health Maintenance Due  Topic Date Due  . TETANUS/TDAP  07/07/2012  . INFLUENZA VACCINE  04/01/2019    ROS  The following portions of the patient's history were reviewed and updated as appropriate: allergies, current medications, past family history, past medical history, past social history, past surgical history and problem list. Problem list updated.   See flowsheet for other program required questions.  Objective:   Vitals:   11/08/19 1531  BP: 120/78  Weight: 142 lb 3.2 oz (64.5 kg)  Height: 5\' 3"  (1.6 m)    Physical Exam- not indicated    Assessment and Plan:  Natasha Sloan is a 29 y.o. female presenting to the [facility] Department for a Women's Health problem visit  1. Nexplanon removal Nexplanon Removal Patient identified, informed consent performed, consent signed.   Appropriate time out taken. Nexplanon site identified.  Area prepped in usual sterile fashon. 3 ml of 1% lidocaine with Epinephrine was used to anesthetize the area at the distal end of the implant and along implant site. A small stab incision was made right beside the implant on the distal portion.  The Nexplanon rod was grasped manually  To remove without difficulty.  There was minimal blood loss. There were no complications.  Steri-strips were applied over the small incision.  A pressure bandage was applied to reduce any bruising.  The patient tolerated the procedure well and was given post procedure instructions.    2. OCP (oral contraceptive pills) initiation  - norgestrel-ethinyl  estradiol (CRYSELLE-28) 0.3-30 MG-MCG tablet; Take 1 tablet by mouth daily. Take 1 active tablet x 12 weeks, then 13 th week take inactive pills x 7 days.Repeat x 1 year  Dispense: 16 Package; Refill: 0 -Use condoms for back up x 1 week. Condoms always for STD prevention.  3. General counseling and advice on contraceptive management      No follow-ups on file.  No future appointments.  11-23-1982, FNP.

## 2019-11-08 NOTE — Progress Notes (Signed)
Only able to dispense #6 packs of Cryselle for continuous dosing due to qty available/expiration dates. Pt to call for appt for OCP supply when she opens the last pack. Consent signed. Condoms declined. Provider orders completed.

## 2019-11-08 NOTE — Progress Notes (Signed)
Pt desires nexplanon removal, wants OCP. Last sex without a condom was approx 11/01/2019. Had sex with condom on 11/04/2019, no problems with condom.

## 2020-02-05 ENCOUNTER — Encounter: Payer: Self-pay | Admitting: *Deleted

## 2020-02-05 ENCOUNTER — Other Ambulatory Visit: Payer: Self-pay

## 2020-02-05 DIAGNOSIS — R102 Pelvic and perineal pain: Secondary | ICD-10-CM | POA: Insufficient documentation

## 2020-02-05 DIAGNOSIS — N739 Female pelvic inflammatory disease, unspecified: Secondary | ICD-10-CM | POA: Insufficient documentation

## 2020-02-05 DIAGNOSIS — J45909 Unspecified asthma, uncomplicated: Secondary | ICD-10-CM | POA: Insufficient documentation

## 2020-02-05 LAB — URINALYSIS, COMPLETE (UACMP) WITH MICROSCOPIC
Bacteria, UA: NONE SEEN
Bilirubin Urine: NEGATIVE
Glucose, UA: NEGATIVE mg/dL
Hgb urine dipstick: NEGATIVE
Ketones, ur: NEGATIVE mg/dL
Leukocytes,Ua: NEGATIVE
Nitrite: NEGATIVE
Protein, ur: NEGATIVE mg/dL
Specific Gravity, Urine: 1.025 (ref 1.005–1.030)
pH: 7 (ref 5.0–8.0)

## 2020-02-05 LAB — CBC
HCT: 34.2 % — ABNORMAL LOW (ref 36.0–46.0)
Hemoglobin: 10.9 g/dL — ABNORMAL LOW (ref 12.0–15.0)
MCH: 24.3 pg — ABNORMAL LOW (ref 26.0–34.0)
MCHC: 31.9 g/dL (ref 30.0–36.0)
MCV: 76.3 fL — ABNORMAL LOW (ref 80.0–100.0)
Platelets: 263 10*3/uL (ref 150–400)
RBC: 4.48 MIL/uL (ref 3.87–5.11)
RDW: 16.6 % — ABNORMAL HIGH (ref 11.5–15.5)
WBC: 5.7 10*3/uL (ref 4.0–10.5)
nRBC: 0 % (ref 0.0–0.2)

## 2020-02-05 LAB — COMPREHENSIVE METABOLIC PANEL
ALT: 40 U/L (ref 0–44)
AST: 35 U/L (ref 15–41)
Albumin: 3.8 g/dL (ref 3.5–5.0)
Alkaline Phosphatase: 83 U/L (ref 38–126)
Anion gap: 9 (ref 5–15)
BUN: 12 mg/dL (ref 6–20)
CO2: 25 mmol/L (ref 22–32)
Calcium: 8.7 mg/dL — ABNORMAL LOW (ref 8.9–10.3)
Chloride: 104 mmol/L (ref 98–111)
Creatinine, Ser: 0.6 mg/dL (ref 0.44–1.00)
GFR calc Af Amer: >60 mL/min (ref >60)
GFR calc non Af Amer: >60 mL/min (ref >60)
Glucose, Bld: 112 mg/dL — ABNORMAL HIGH (ref 70–99)
Potassium: 3.6 mmol/L (ref 3.5–5.1)
Sodium: 138 mmol/L (ref 135–145)
Total Bilirubin: 0.4 mg/dL (ref 0.3–1.2)
Total Protein: 7.2 g/dL (ref 6.5–8.1)

## 2020-02-05 LAB — LIPASE, BLOOD: Lipase: 25 U/L (ref 11–51)

## 2020-02-05 LAB — POCT PREGNANCY, URINE: Preg Test, Ur: NEGATIVE

## 2020-02-05 MED ORDER — SODIUM CHLORIDE 0.9% FLUSH
3.0000 mL | Freq: Once | INTRAVENOUS | Status: AC
  Administered 2020-02-06: 3 mL via INTRAVENOUS

## 2020-02-05 NOTE — ED Triage Notes (Signed)
Pt has left lower abd pain since yesterday.  Pt reports nausea.  No v/d.   Denies urinary sx.  Pt alert.  Speech clear.

## 2020-02-06 ENCOUNTER — Emergency Department
Admission: EM | Admit: 2020-02-06 | Discharge: 2020-02-06 | Disposition: A | Discharge status: Home / Self Care | Payer: Self-pay | Attending: Emergency Medicine | Admitting: Emergency Medicine

## 2020-02-06 ENCOUNTER — Emergency Department: Payer: Self-pay

## 2020-02-06 DIAGNOSIS — N730 Acute parametritis and pelvic cellulitis: Secondary | ICD-10-CM

## 2020-02-06 DIAGNOSIS — R1032 Left lower quadrant pain: Secondary | ICD-10-CM

## 2020-02-06 LAB — CHLAMYDIA/NGC RT PCR (ARMC ONLY)
Chlamydia Tr: NOT DETECTED
N gonorrhoeae: NOT DETECTED

## 2020-02-06 LAB — WET PREP, GENITAL
Sperm: NONE SEEN
Trich, Wet Prep: NONE SEEN
Yeast Wet Prep HPF POC: NONE SEEN

## 2020-02-06 LAB — HCG, QUANTITATIVE, PREGNANCY: hCG, Beta Chain, Quant, S: <1 m[IU]/mL (ref <5)

## 2020-02-06 MED ORDER — METRONIDAZOLE 500 MG PO TABS
500.0000 mg | ORAL_TABLET | Freq: Two times a day (BID) | ORAL | 0 refills | Status: AC
Start: 2020-02-06 — End: 2020-02-20

## 2020-02-06 MED ORDER — SODIUM CHLORIDE 0.9 % IV SOLN
1.0000 g | Freq: Once | INTRAVENOUS | Status: AC
  Administered 2020-02-06: 1 g via INTRAVENOUS
  Filled 2020-02-06: qty 10

## 2020-02-06 MED ORDER — IOHEXOL 300 MG/ML  SOLN
100.0000 mL | Freq: Once | INTRAMUSCULAR | Status: AC | PRN
  Administered 2020-02-06: 100 mL via INTRAVENOUS

## 2020-02-06 MED ORDER — KETOROLAC TROMETHAMINE 30 MG/ML IJ SOLN
15.0000 mg | Freq: Once | INTRAMUSCULAR | Status: AC
  Administered 2020-02-06: 15 mg via INTRAVENOUS
  Filled 2020-02-06: qty 1

## 2020-02-06 MED ORDER — DOXYCYCLINE HYCLATE 100 MG PO CAPS
100.0000 mg | ORAL_CAPSULE | Freq: Two times a day (BID) | ORAL | 0 refills | Status: AC
Start: 2020-02-06 — End: 2020-02-20

## 2020-02-06 MED ORDER — DOXYCYCLINE HYCLATE 100 MG PO TABS
100.0000 mg | ORAL_TABLET | Freq: Once | ORAL | Status: AC
  Administered 2020-02-06: 100 mg via ORAL
  Filled 2020-02-06: qty 1

## 2020-02-06 MED ORDER — ONDANSETRON HCL 4 MG/2ML IJ SOLN
4.0000 mg | Freq: Once | INTRAMUSCULAR | Status: AC
  Administered 2020-02-06: 4 mg via INTRAVENOUS
  Filled 2020-02-06: qty 2

## 2020-02-06 MED ORDER — METRONIDAZOLE 500 MG PO TABS
500.0000 mg | ORAL_TABLET | Freq: Once | ORAL | Status: AC
  Administered 2020-02-06: 500 mg via ORAL
  Filled 2020-02-06: qty 1

## 2020-02-06 NOTE — ED Provider Notes (Signed)
[facility] Emergency Department Provider Note  ____________________________________________  Time seen: Approximately 1:16 AM  I have reviewed the triage vital signs and the nursing notes.   HISTORY  Chief Complaint Abdominal Pain   HPI Natasha Sloan is a 29 y.o. female who presents for evaluation of left lower quadrant abdominal pain.  Patient reports the pain is dull, constant, 8 out of 10, located in the left lower quadrant and radiating to her back.  She has had nausea but no vomiting, no diarrhea constipation, no dysuria or hematuria, no vaginal discharge or bleeding.  She denies ever having similar pain.  No prior abdominal surgeries.  She is sexually active.  She has not taken anything at home for the pain.   Past Medical History:  Diagnosis Date  . Anxiety     Patient Active Problem List   Diagnosis Date Noted  . R/O Posttraumatic stress disorder 03/29/2018  . Asthma 09/09/2011    Past Surgical History:  Procedure Laterality Date  . EYE SURGERY Left 2007   "remove clot"    Prior to Admission medications   Medication Sig Start Date End Date Taking? Authorizing Provider  doxycycline (VIBRAMYCIN) 100 MG capsule Take 1 capsule (100 mg total) by mouth 2 (two) times daily for 14 days. 02/06/20 02/20/20  Nita Sickle, MD  ibuprofen (ADVIL,MOTRIN) 800 MG tablet Take 1 tablet (800 mg total) by mouth every 8 (eight) hours as needed. 07/09/18   Enid Derry, PA-C  Iron, Ferrous Sulfate, 325 (65 Fe) MG TABS Take 1 tablet by mouth 2 (two) times daily. 08/30/19 09/29/19  Federico Flake, MD  metroNIDAZOLE (FLAGYL) 500 MG tablet Take 1 tablet (500 mg total) by mouth 2 (two) times daily for 14 days. 02/06/20 02/20/20  Nita Sickle, MD  Norethindrone Acetate-Ethinyl Estrad-FE (LOESTRIN 24 FE) 1-20 MG-MCG(24) tablet Take 1 tablet by mouth daily. Patient not taking: Reported on 11/08/2019 08/30/19   Ann Held, PA-C  norgestrel-ethinyl  estradiol (CRYSELLE-28) 0.3-30 MG-MCG tablet Take 1 tablet by mouth daily. Take 1 active tablet x 12 weeks, then 13 th week take inactive pills x 7 days.Repeat x 1 year 11/08/19   Larene Pickett, FNP    Allergies Patient has no known allergies.  Family History  Problem Relation Age of Onset  . Breast cancer Mother   . Diabetes Mother   . Diabetes Father     Social History Social History   Tobacco Use  . Smoking status: Never Smoker  . Smokeless tobacco: Never Used  Substance Use Topics  . Alcohol use: Yes    Alcohol/week: 4.0 standard drinks    Types: 4 Cans of beer per week  . Drug use: Never    Review of Systems  Constitutional: Negative for fever. Eyes: Negative for visual changes. ENT: Negative for sore throat. Neck: No neck pain  Cardiovascular: Negative for chest pain. Respiratory: Negative for shortness of breath. Gastrointestinal: + abdominal pain and nausea. No vomiting or diarrhea. Genitourinary: Negative for dysuria. Musculoskeletal: Negative for back pain. Skin: Negative for rash. Neurological: Negative for headaches, weakness or numbness. Psych: No SI or HI  ____________________________________________   PHYSICAL EXAM:  VITAL SIGNS: ED Triage Vitals  Enc Vitals Group     BP 02/05/20 2024 119/74     Pulse Rate 02/05/20 2024 84     Resp 02/05/20 2024 18     Temp 02/05/20 2024 98.9 F (37.2 C)     Temp Source 02/05/20 2024 Oral  SpO2 02/05/20 2024 99 %     Weight 02/05/20 2026 145 lb (65.8 kg)     Height 02/05/20 2026 5\' 3"  (1.6 m)     Head Circumference --      Peak Flow --      Pain Score 02/05/20 2026 8     Pain Loc --      Pain Edu? --      Excl. in [place]? --     Constitutional: Alert and oriented. Well appearing and in no apparent distress. HEENT:      Head: Normocephalic and atraumatic.         Eyes: Conjunctivae are normal. Sclera is non-icteric.       Mouth/Throat: Mucous membranes are moist.       Neck: Supple with no signs of  meningismus. Cardiovascular: Regular rate and rhythm. No murmurs, gallops, or rubs.  Respiratory: Normal respiratory effort. Lungs are clear to auscultation bilaterally.  Gastrointestinal: Soft, tender to palpation the left lower quadrant, and non distended with positive bowel sounds. No rebound or guarding. Genitourinary: No CVA tenderness. Pelvic exam: Normal external genitalia, no rashes or lesions. Normal cervical mucus. Os closed. No cervical motion tenderness.  L adnexal tenderness Musculoskeletal:  No edema, cyanosis, or erythema of extremities. Neurologic: Normal speech and language. Face is symmetric. Moving all extremities. No gross focal neurologic deficits are appreciated. Skin: Skin is warm, dry and intact. No rash noted. Psychiatric: Mood and affect are normal. Speech and behavior are normal.  ____________________________________________   LABS (all labs ordered are listed, but only abnormal results are displayed)  Labs Reviewed  WET PREP, GENITAL - Abnormal; Notable for the following components:      Result Value   Clue Cells Wet Prep HPF POC PRESENT (*)    WBC, Wet Prep HPF POC RARE (*)    All other components within normal limits  COMPREHENSIVE METABOLIC PANEL - Abnormal; Notable for the following components:   Glucose, Bld 112 (*)    Calcium 8.7 (*)    All other components within normal limits  CBC - Abnormal; Notable for the following components:   Hemoglobin 10.9 (*)    HCT 34.2 (*)    MCV 76.3 (*)    MCH 24.3 (*)    RDW 16.6 (*)    All other components within normal limits  URINALYSIS, COMPLETE (UACMP) WITH MICROSCOPIC - Abnormal; Notable for the following components:   Color, Urine YELLOW (*)    APPearance HAZY (*)    All other components within normal limits  CHLAMYDIA/NGC RT PCR ([facility] ONLY)  LIPASE, BLOOD  HCG, QUANTITATIVE, PREGNANCY  POC URINE PREG, ED  POCT PREGNANCY, URINE   ____________________________________________  EKG  none    ____________________________________________  RADIOLOGY  I have personally reviewed the images performed during this visit and I agree with the Radiologist's read.   Interpretation by Radiologist:  CT ABDOMEN PELVIS W CONTRAST  Result Date: 02/06/2020 CLINICAL DATA:  Abdominal pain, nausea EXAM: CT ABDOMEN AND PELVIS WITH CONTRAST TECHNIQUE: Multidetector CT imaging of the abdomen and pelvis was performed using the standard protocol following bolus administration of intravenous contrast. CONTRAST:  165mL OMNIPAQUE IOHEXOL 300 MG/ML  SOLN COMPARISON:  CT 06/23/2012 FINDINGS: Lower chest: Lung bases are clear. Normal heart size. No pericardial effusion. Hepatobiliary: No focal liver abnormality is seen. No gallstones, gallbladder wall thickening, or biliary dilatation. Pancreas: Unremarkable. No pancreatic ductal dilatation or surrounding inflammatory changes. Spleen: Normal in size without focal abnormality. Adrenals/Urinary Tract: Normal adrenal  glands. Kidneys are normally located with symmetric enhancement. No suspicious renal lesion, urolithiasis or hydronephrosis. Urinary bladder is largely decompressed at the time of exam and therefore poorly evaluated by CT imaging. Bladder wall thickening slightly greater than expected for underdistention with faint perivesicular hazy stranding. Stomach/Bowel: Small hiatal hernia. Distal stomach and duodenum are unremarkable. No small bowel thickening or dilatation. A normal appendix is visualized. No proximal colonic thickening or dilatation. There is ill-defined thickening near the level of the rectum with few punctate foci of gas not reliably within the rectal lumen (2/76). Difficult to discern the fat planes with the adjacent vaginal canal. Vascular/Lymphatic: No significant vascular findings are present. No enlarged abdominal or pelvic lymph nodes. Reproductive: Mild heterogeneous attenuation of the uterus is nonspecific and can be physiologic depending on the  phase of contrast timing. There is a small amount of low-attenuation fluid and air within the vaginal canal, nonspecific. No concerning adnexal lesions. No abdominopelvic free air or fluid. Other: No abdominopelvic free air or fluid. No bowel containing hernia. Small fat containing umbilical hernia with some adjacent stranding of the herniated fat and adjacent skin thickening. Could reflect some mild strangulation. Musculoskeletal: No acute osseous abnormality or suspicious osseous lesion. IMPRESSION: 1. There is ill-defined thickening near the level of the rectum with few punctate foci of gas not reliably within the rectal lumen. Difficult to discern the fat planes with the adjacent vaginal canal. Correlate for clinical features of proctitis. No free intraperitoneal air is seen. 2. There is a small amount of low-attenuation fluid and air within the vaginal canal, nonspecific. Recommend correlation with patient menses and consideration of a pelvic exam. 3. Bladder wall thickening slightly greater than expected for underdistention with faint perivesicular hazy stranding. Recommend correlation with urinalysis to exclude cystitis though could be reactive to the pelvic processes above. 4. Small fat containing umbilical hernia with some adjacent stranding of the herniated fat and adjacent skin thickening which could suggest mild strangulation of the fat. 5. Small hiatal hernia. Electronically Signed   By: Kreg Shropshire M.D.   On: 02/06/2020 03:19   US PELVIC COMPLETE W TRANSVAGINAL AND TORSION R/O  Result Date: 02/06/2020 CLINICAL DATA:  Left lower quadrant pain. EXAM: TRANSABDOMINAL AND TRANSVAGINAL ULTRASOUND OF PELVIS DOPPLER ULTRASOUND OF OVARIES TECHNIQUE: Both transabdominal and transvaginal ultrasound examinations of the pelvis were performed. Transabdominal technique was performed for global imaging of the pelvis including uterus, ovaries, adnexal regions, and pelvic cul-de-sac. It was necessary to proceed with  endovaginal exam following the transabdominal exam to visualize the left ovary. Color and duplex Doppler ultrasound was utilized to evaluate blood flow to the ovaries. COMPARISON:  None. FINDINGS: Uterus Measurements: 6.7 x 3.8 x 4.6 cm = volume: 60 mL. No fibroids or other mass visualized. Endometrium Thickness: 6 mm.  No focal abnormality visualized. Right ovary Measurements: 3.7 x 1.9 x 2.4 cm = volume: 8.7 mL. Normal appearance/no adnexal mass. Left ovary Measurements: 2.7 x 1.9 x 1.7 cm = volume: 4.6 mL. Normal appearance/no adnexal mass. Pulsed Doppler evaluation of both ovaries demonstrates normal low-resistance arterial and venous waveforms. Other findings There is a trace amount of pelvic free fluid. IMPRESSION: Normal study. Electronically Signed   By: Katherine Mantle M.D.   On: 02/06/2020 02:33      ____________________________________________   PROCEDURES  Procedure(s) performed: None Procedures Critical Care performed:  None ____________________________________________   INITIAL IMPRESSION / ASSESSMENT AND PLAN / ED COURSE   29 y.o. female who presents for evaluation of left  lower quadrant abdominal pain x 2 days radiating to her back and associated with nausea.  Patient is well-appearing in no distress with normal vitals she is tender to palpation of the left lower quadrant with no rebound or guarding.  Pelvic exam showing no CMT and a left adnexal tenderness.  Differential diagnosis including ovarian cyst versus ovarian torsion versus ectopic pregnancy versus pregnancy versus PID versus STD versus UTI versus diverticulitis.  Urine pregnancy test is negative.  hCG is pending.  Normal CMP and lipase, no leukocytosis.  Mild stable anemia.  Transvaginal ultrasound is pending.  Will give Toradol and Zofran for symptom relief.  Old medical records reviewed.  _________________________ 3:39 AM on 02/06/2020 -----------------------------------------  Transvaginal ultrasound  showing no acute abnormalities, confirmed by radiology.  Wet prep, GC and chlamydia showing only clue cells.  Since patient was significantly tender to palpation on the left lower quadrant she was sent for a CT. CT showing signs of infection around the rectum, vaginal canal, and bladder, confirmed by radiology.  Patient reports participating in receptive anal intercourse about 10 days ago.  She denies any rectal pain.  Rectal exam showing no tenderness, crepitus, laceration, or any other abnormalities.  UA negative for UTI.  Therefore will treat for PID with Rocephin, doxycycline, and flagyl. Discussed safe sex and my standard return precautions with patient.     _____________________________________________ Please note:  Patient was evaluated in Emergency Department today for the symptoms described in the history of present illness. Patient was evaluated in the context of the global COVID-19 pandemic, which necessitated consideration that the patient might be at risk for infection with the SARS-CoV-2 virus that causes COVID-19. Institutional protocols and algorithms that pertain to the evaluation of patients at risk for COVID-19 are in a state of rapid change based on information released by regulatory bodies including the CDC and federal and state organizations. These policies and algorithms were followed during the patient's care in the ED.  Some ED evaluations and interventions may be delayed as a result of limited staffing during the pandemic.   [place] Controlled Substance Database was reviewed by me. ____________________________________________   FINAL CLINICAL IMPRESSION(S) / ED DIAGNOSES   Final diagnoses:  LLQ abdominal pain  PID (acute pelvic inflammatory disease)      NEW MEDICATIONS STARTED DURING THIS VISIT:  ED Discharge Orders         Ordered    doxycycline (VIBRAMYCIN) 100 MG capsule  2 times daily     02/06/20 0342    metroNIDAZOLE (FLAGYL) 500 MG tablet  2 times daily      02/06/20 0342           Note:  This document was prepared using Dragon voice recognition software and may include unintentional dictation errors.    Don Perking, [place], MD 02/06/20 [phone]

## 2020-02-06 NOTE — ED Notes (Signed)
Pelvic completed with MD Don Perking

## 2020-02-06 NOTE — ED Notes (Signed)
MD at bedside. 

## 2020-05-22 ENCOUNTER — Ambulatory Visit (LOCAL_COMMUNITY_HEALTH_CENTER): Payer: Self-pay

## 2020-05-22 ENCOUNTER — Other Ambulatory Visit: Payer: Self-pay

## 2020-05-22 VITALS — BP 114/71 | HR 95 | Ht 63.0 in | Wt 143.5 lb

## 2020-05-22 DIAGNOSIS — Z3201 Encounter for pregnancy test, result positive: Secondary | ICD-10-CM

## 2020-05-22 LAB — PREGNANCY, URINE: Preg Test, Ur: POSITIVE — AB

## 2020-05-22 MED ORDER — PRENATAL VITAMIN 27-0.8 MG PO TABS: 1.0000 | ORAL_TABLET | Freq: Every day | ORAL | 0 refills | Status: DC

## 2020-05-22 NOTE — Progress Notes (Signed)
Client undecided as to [facility] provider. Unsure if LMP 03/26/2020 or if had menses in August. Select first day of LMP as 04/22/2020. Client anxious to know when conceived as had intercourse with two different men within 7 days of each other. Counseled that unable to determine that at this time. Jossie Ng, RN

## 2020-05-27 ENCOUNTER — Telehealth: Payer: Self-pay

## 2020-05-27 NOTE — Telephone Encounter (Signed)
TC to patient to f/u on +PT. Patient unsure where to get care. She states she left a message at [facility] and at [facility]. Patient is about 5 weeks 0 days pregnant at this time and patient counseled new OB appointment usually scheduled for around 11-12 weeks pregnancy. Patient states she is driving and will call back to schedule.Burt Knack, RN

## 2020-05-29 ENCOUNTER — Telehealth: Payer: Self-pay

## 2020-05-29 NOTE — Telephone Encounter (Signed)
Pt called triage 9/28 c/o spotting. [redacted] weeks pregnant. Has not been seen here and doesn't have an appt. I tried to call pt to advise her to go to ER or to call and get an appt scheduled with [place]. Let me know when she calls back

## 2020-07-01 ENCOUNTER — Telehealth: Payer: Self-pay

## 2020-07-01 NOTE — Telephone Encounter (Signed)
TC to patient to follow-up on +PT. No answer, unable to LM.Burt Knack, RN

## 2020-08-28 ENCOUNTER — Emergency Department
Admission: EM | Admit: 2020-08-28 | Discharge: 2020-08-29 | Disposition: A | Discharge status: Home / Self Care | Payer: Medicaid Other | Attending: Emergency Medicine | Admitting: Emergency Medicine

## 2020-08-28 ENCOUNTER — Ambulatory Visit (LOCAL_COMMUNITY_HEALTH_CENTER): Payer: Medicaid Other

## 2020-08-28 ENCOUNTER — Emergency Department: Payer: Medicaid Other

## 2020-08-28 ENCOUNTER — Other Ambulatory Visit: Payer: Self-pay

## 2020-08-28 VITALS — BP 110/64 | Ht 62.0 in | Wt 140.5 lb

## 2020-08-28 DIAGNOSIS — R634 Abnormal weight loss: Secondary | ICD-10-CM

## 2020-08-28 DIAGNOSIS — O4691 Antepartum hemorrhage, unspecified, first trimester: Secondary | ICD-10-CM | POA: Insufficient documentation

## 2020-08-28 DIAGNOSIS — O209 Hemorrhage in early pregnancy, unspecified: Secondary | ICD-10-CM

## 2020-08-28 DIAGNOSIS — R109 Unspecified abdominal pain: Secondary | ICD-10-CM | POA: Insufficient documentation

## 2020-08-28 DIAGNOSIS — Z3201 Encounter for pregnancy test, result positive: Secondary | ICD-10-CM

## 2020-08-28 DIAGNOSIS — Z3A12 12 weeks gestation of pregnancy: Secondary | ICD-10-CM | POA: Insufficient documentation

## 2020-08-28 DIAGNOSIS — Z5321 Procedure and treatment not carried out due to patient leaving prior to being seen by health care provider: Secondary | ICD-10-CM | POA: Insufficient documentation

## 2020-08-28 DIAGNOSIS — O26891 Other specified pregnancy related conditions, first trimester: Secondary | ICD-10-CM | POA: Diagnosis not present

## 2020-08-28 DIAGNOSIS — R102 Pelvic and perineal pain: Secondary | ICD-10-CM

## 2020-08-28 LAB — CBC WITH DIFFERENTIAL/PLATELET
Abs Immature Granulocytes: 0.02 10*3/uL (ref 0.00–0.07)
Basophils Absolute: 0 10*3/uL (ref 0.0–0.1)
Basophils Relative: 0 %
Eosinophils Absolute: 0.2 10*3/uL (ref 0.0–0.5)
Eosinophils Relative: 4 %
HCT: 35.1 % — ABNORMAL LOW (ref 36.0–46.0)
Hemoglobin: 11.6 g/dL — ABNORMAL LOW (ref 12.0–15.0)
Immature Granulocytes: 0 %
Lymphocytes Relative: 22 %
Lymphs Abs: 1.2 10*3/uL (ref 0.7–4.0)
MCH: 25.3 pg — ABNORMAL LOW (ref 26.0–34.0)
MCHC: 33 g/dL (ref 30.0–36.0)
MCV: 76.5 fL — ABNORMAL LOW (ref 80.0–100.0)
Monocytes Absolute: 0.4 10*3/uL (ref 0.1–1.0)
Monocytes Relative: 8 %
Neutro Abs: 3.5 10*3/uL (ref 1.7–7.7)
Neutrophils Relative %: 66 %
Platelets: 241 10*3/uL (ref 150–400)
RBC: 4.59 MIL/uL (ref 3.87–5.11)
RDW: 15.8 % — ABNORMAL HIGH (ref 11.5–15.5)
WBC: 5.4 10*3/uL (ref 4.0–10.5)
nRBC: 0 % (ref 0.0–0.2)

## 2020-08-28 LAB — COMPREHENSIVE METABOLIC PANEL
ALT: 19 U/L (ref 0–44)
AST: 16 U/L (ref 15–41)
Albumin: 3.6 g/dL (ref 3.5–5.0)
Alkaline Phosphatase: 59 U/L (ref 38–126)
Anion gap: 7 (ref 5–15)
BUN: 8 mg/dL (ref 6–20)
CO2: 24 mmol/L (ref 22–32)
Calcium: 8.5 mg/dL — ABNORMAL LOW (ref 8.9–10.3)
Chloride: 100 mmol/L (ref 98–111)
Creatinine, Ser: 0.39 mg/dL — ABNORMAL LOW (ref 0.44–1.00)
GFR, Estimated: >60 mL/min (ref >60)
Glucose, Bld: 84 mg/dL (ref 70–99)
Potassium: 3.4 mmol/L — ABNORMAL LOW (ref 3.5–5.1)
Sodium: 131 mmol/L — ABNORMAL LOW (ref 135–145)
Total Bilirubin: 0.3 mg/dL (ref 0.3–1.2)
Total Protein: 7.3 g/dL (ref 6.5–8.1)

## 2020-08-28 LAB — URINALYSIS, COMPLETE (UACMP) WITH MICROSCOPIC
Bacteria, UA: NONE SEEN
Bilirubin Urine: NEGATIVE
Glucose, UA: NEGATIVE mg/dL
Hgb urine dipstick: NEGATIVE
Ketones, ur: NEGATIVE mg/dL
Leukocytes,Ua: NEGATIVE
Nitrite: NEGATIVE
Protein, ur: NEGATIVE mg/dL
Specific Gravity, Urine: 1.015 (ref 1.005–1.030)
pH: 7 (ref 5.0–8.0)

## 2020-08-28 LAB — HCG, QUANTITATIVE, PREGNANCY: hCG, Beta Chain, Quant, S: 94018 m[IU]/mL — ABNORMAL HIGH (ref <5)

## 2020-08-28 LAB — ABO/RH: ABO/RH(D): O POS

## 2020-08-28 MED ORDER — PRENATAL 27-0.8 MG PO TABS: 1.0000 | ORAL_TABLET | Freq: Every day | ORAL | 0 refills | Status: AC

## 2020-08-28 NOTE — ED Notes (Signed)
Called lab to run blood

## 2020-08-28 NOTE — ED Triage Notes (Signed)
PT to ED via POV from home c/o abd cramping and vag bleeding. PT is [redacted]wks pregnant. PT has had vaginal bleeding throughout pregnancy but cramping is new. Last [place] was dec 7.  PT has lost aprrox 10 lb recently unintentionally

## 2020-08-28 NOTE — Progress Notes (Addendum)
UPT positive today. Pt plans prenatal care at [facility] or [facility] and wants to get into care soon. Discussed process of preadmit. Pt thinks LMP 05/29/2020, but thinks it's  possible she was having SAB d/t heavy bleeding and recent positive preg test at that time. Pt reports she has had spotting and lower back pain for past 6 weeks. States she was in [place] earlier in December and had ultrasound on 08/06/2020 dating preg at 8 weeks. Advised pt to seek immediate medical attn/ go to ER  to evaluate symptoms of bleeding and low back pain. Pt reports understanding. No [facility] on file. Jerel Shepherd, RN

## 2020-08-29 LAB — PREGNANCY, URINE: Preg Test, Ur: POSITIVE — AB

## 2020-08-31 NOTE — L&D Delivery Note (Signed)
Delivery Note  1740 In room to see patient. RN prepared for second stage. Effective coached maternal pushing efforts initiated.   Spontaneous vaginal birth of liveborn female infant in left occiput anterior position over intact perineum at 1747. Infant immediately skin to skin, delayed cord clamping and three (3) vessel cord. Single tube of cord blood collected APGARs: 9, 9. Weight: 3130 g (6 pounds 14 ounces). Receiving birth present at bedside.   Pitocin and TXA boluses initiated due to history of postpartum hemorrhage requiring blood transfusion, see MAR. Spontaneous delivery of intact placenta at 1753. Uterus firm. Rubra small. Counts correct x 2. QBL: 150 ml. Vault check completed under epidural anesthesia.   Initiate routine postpartum care and orders. Mom to postpartum.  Baby to Couplet care / Skin to Skin.  Patient's mother present at bedside and overjoyed with the birth of "Rosalba".    Serafina Royals, CNM [facility], [facility] 03/13/2021, 8:30 PM

## 2020-09-13 ENCOUNTER — Other Ambulatory Visit (HOSPITAL_COMMUNITY)
Admission: RE | Admit: 2020-09-13 | Discharge: 2020-09-13 | Disposition: A | Discharge status: Home / Self Care | Payer: Medicaid Other | Source: Ambulatory Visit | Attending: Certified Nurse Midwife | Admitting: Certified Nurse Midwife

## 2020-09-13 ENCOUNTER — Ambulatory Visit (INDEPENDENT_AMBULATORY_CARE_PROVIDER_SITE_OTHER): Payer: Self-pay | Admitting: Certified Nurse Midwife

## 2020-09-13 ENCOUNTER — Encounter: Payer: Self-pay | Admitting: Certified Nurse Midwife

## 2020-09-13 ENCOUNTER — Other Ambulatory Visit: Payer: Self-pay

## 2020-09-13 VITALS — BP 99/64 | HR 88 | Ht 62.0 in | Wt 139.2 lb

## 2020-09-13 DIAGNOSIS — O4690 Antepartum hemorrhage, unspecified, unspecified trimester: Secondary | ICD-10-CM | POA: Diagnosis present

## 2020-09-13 DIAGNOSIS — Z6740 Type O blood, Rh positive: Secondary | ICD-10-CM | POA: Insufficient documentation

## 2020-09-13 DIAGNOSIS — Z7689 Persons encountering health services in other specified circumstances: Secondary | ICD-10-CM

## 2020-09-13 DIAGNOSIS — R634 Abnormal weight loss: Secondary | ICD-10-CM | POA: Insufficient documentation

## 2020-09-13 DIAGNOSIS — Z8619 Personal history of other infectious and parasitic diseases: Secondary | ICD-10-CM

## 2020-09-13 NOTE — Progress Notes (Signed)
GYN ENCOUNTER NOTE  Subjective:       Natasha Sloan is a 30 y.o. G60P1021 female here for gynecologic evaluation of the following issues:  1. Vaginal spotting and abdominal cramping in pregnancy, spotting with wiping every three (3) days-history of chlamydia 2. Weight loss  Requested record to be sent from office in [place]. Taking prenatal vitamin with folic acid and DHA.   Denies difficulty breathing or respiratory distress, chest pain, dysuria, and leg pain or swelling.    Gynecologic History  Patient's last menstrual period was 05/29/2020 (exact date).  Estimated date of birth: 03/15/2019  Gestational age: 37 weeks 6 days  Contraception: none  Obstetric History  OB History  Gravida Para Term Preterm AB Living  4 1 1  0 2 1  SAB IAB Ectopic Multiple Live Births  2 0 0 0 1    # Outcome Date GA Lbr Len/2nd Weight Sex Delivery Anes PTL Lv  4 Current           3 SAB 05/2020          2 SAB 2012          1 Term 04/08/10 [redacted]w[redacted]d  5 lb 12 oz (2.608 kg) M         Birth Comments: [facility]    Past Medical History:  Diagnosis Date  . Anxiety   . History of anemia   . History of asthma   . History of urinary tract infection     Past Surgical History:  Procedure Laterality Date  . EYE SURGERY Left 2007   "remove clot"  . ROOT CANAL     x1    Current Outpatient Medications on File Prior to Visit  Medication Sig Dispense Refill  . Prenatal Vit-Fe Fumarate-FA (MULTIVITAMIN-PRENATAL) 27-0.8 MG TABS tablet Take 1 tablet by mouth daily at 12 noon. 100 tablet 0   No current facility-administered medications on file prior to visit.    No Known Allergies  Social History   Socioeconomic History  . Marital status: Single    Spouse name: Not on file  . Number of children: 1  . Years of education: 65  . Highest education level: High school graduate  Occupational History  . Not on file  Tobacco Use  . Smoking status: Never Smoker  . Smokeless tobacco: Never Used  Vaping  Use  . Vaping Use: Never used  Substance and Sexual Activity  . Alcohol use: Not Currently    Comment: last use - 07/01/2020  . Drug use: Not Currently    Types: Cocaine    Comment: last used cocaine-  first week 07/2020  . Sexual activity: Not Currently    Partners: Male    Birth control/protection: Implant    Comment: Nexplanon removed 10/2019, heavy bleeding  Other Topics Concern  . Not on file  Social History Narrative  . Not on file   Social Determinants of Health   Financial Resource Strain: Not on file  Food Insecurity: Not on file  Transportation Needs: Not on file  Physical Activity: Not on file  Stress: Not on file  Social Connections: Not on file  Intimate Partner Violence: Not At Risk  . Fear of Current or Ex-Partner: No  . Emotionally Abused: No  . Physically Abused: No  . Sexually Abused: No    Family History  Problem Relation Age of Onset  . Breast cancer Mother   . Diabetes Mother   . Diabetes Father     The following  portions of the patient's history were reviewed and updated as appropriate: allergies, current medications, past family history, past medical history, past social history, past surgical history and problem list.  Review of Systems  ROS negative except as noted above. Information obtained from patient.   Objective:   BP 99/64   Pulse 88   Ht 5\' 2"  (1.575 m)   Wt 139 lb 3.2 oz (63.1 kg)   LMP 05/29/2020 (Exact Date) Comment: bleeding 05/29/20-06/06/20, but may have had SAB at this time  BMI 25.46 kg/m    CONSTITUTIONAL: Well-developed, well-nourished female in no acute distress.   ABDOMEN: Soft, non distended; Non tender.  Uterus palpable at pubic bone. FHR tones present, 155 bpm  PELVIC:  External Genitalia: Normal  Vagina: Normal  Cervix: Normal, Swab collected   MUSCULOSKELETAL: Normal range of motion. No tenderness.  No cyanosis, clubbing, or edema.  OBSTETRIC <14 WK ULTRASOUND  COMPLETED AT [facility] on  08/28/2020  TECHNIQUE: Transabdominal ultrasound was performed for evaluation of the gestation as well as the maternal uterus and adnexal regions.  COMPARISON:  None.  FINDINGS: Intrauterine gestational sac: Single intrauterine pregnancy.  Yolk sac:  Visualized  Embryo:  Visualized  Cardiac Activity: Visualized  Heart Rate: 160 bpm  CRL: 48 mm   11 w   4 d                  08/30/2020 EDC: 03/15/2021  Subchorionic hemorrhage:  None visualized.  Maternal uterus/adnexae: Ovaries are within normal limits. The left ovary measures 3.2 x 1.8 by 1.6 cm. The right ovary measures 1.7 by 2.7 x 1.4 cm. No significant free fluid  IMPRESSION: Single viable intrauterine pregnancy as above. No specific abnormality is seen.   Assessment:   1. Vaginal bleeding in pregnancy  - Cervicovaginal ancillary only  2. History of chlamydia  - Cervicovaginal ancillary only  3. Weight loss  - Cervicovaginal ancillary only  4. Encounter to establish care  - Cervicovaginal ancillary only    Plan:   Vaginal swab collected, see orders.   Second trimester education provided, see AVS.   Reviewed red flag symptoms and when to call.   RTC x 1 week for intake.   RTC x 2 weeks for NOB PE or sooner if needed.    03/17/2021, CNM [facility], [facility]

## 2020-09-13 NOTE — Patient Instructions (Signed)
WHAT OB PATIENTS CAN EXPECT   Confirmation of pregnancy and ultrasound ordered if medically indicated-[redacted] weeks gestation  New OB (NOB) intake with nurse and New OB (NOB) labs- [redacted] weeks gestation  New OB (NOB) physical examination with provider- 11/[redacted] weeks gestation  Flu vaccine-[redacted] weeks gestation  Anatomy scan-[redacted] weeks gestation  Glucose tolerance test, blood work to test for anemia, T-dap vaccine-[redacted] weeks gestation  Vaginal swabs/cultures-STD/Group B strep-[redacted] weeks gestation  Appointments every 4 weeks until 28 weeks  Every 2 weeks from 28 weeks until 36 weeks  Weekly visits from 36 weeks until delivery    Common Medications Safe in Pregnancy  Acne:      Constipation:  Benzoyl Peroxide     Colace  Clindamycin      Dulcolax Suppository  Topica Erythromycin     Fibercon  Salicylic Acid      Metamucil         Miralax AVOID:        Senakot   Accutane    Cough:  Retin-A       Cough Drops  Tetracycline      Phenergan w/ Codeine if Rx  Minocycline      Robitussin (Plain & DM)  Antibiotics:     Crabs/Lice:  Ceclor       RID  Cephalosporins    AVOID:  E-Mycins      Kwell  Keflex  Macrobid/Macrodantin   Diarrhea:  Penicillin      Kao-Pectate  Zithromax      Imodium AD         PUSH FLUIDS AVOID:       Cipro     Fever:  Tetracycline      Tylenol (Regular or Extra  Minocycline       Strength)  Levaquin      Extra Strength-Do not          Exceed 8 tabs/24 hrs Caffeine:        <222m/day (equiv. To 1 cup of coffee or  approx. 3 12 oz sodas)         Gas: Cold/Hayfever:       Gas-X  Benadryl      Mylicon  Claritin       Phazyme  **Claritin-D        Chlor-Trimeton    Headaches:  Dimetapp      ASA-Free Excedrin  Drixoral-Non-Drowsy     Cold Compress  Mucinex (Guaifenasin)     Tylenol (Regular or Extra  Sudafed/Sudafed-12 Hour     Strength)  **Sudafed PE Pseudoephedrine   Tylenol Cold & Sinus     Vicks Vapor Rub  Zyrtec  **AVOID if Problems With Blood  Pressure         Heartburn: Avoid lying down for at least 1 hour after meals  Aciphex      Maalox     Rash:  Milk of Magnesia     Benadryl    Mylanta       1% Hydrocortisone Cream  Pepcid  Pepcid Complete   Sleep Aids:  Prevacid      Ambien   Prilosec       Benadryl  Rolaids       Chamomile Tea  Tums (Limit 4/day)     Unisom         Tylenol PM         Warm milk-add vanilla or  Hemorrhoids:       Sugar for taste  Anusol/Anusol H.C.  (RX:  Analapram 2.5%)  Sugar Substitutes:  Hydrocortisone OTC     Ok in moderation  Preparation H      Tucks        Vaseline lotion applied to tissue with wiping    Herpes:     Throat:  Acyclovir      Oragel  Famvir  Valtrex     Vaccines:         Flu Shot Leg Cramps:       *Gardasil  Benadryl      Hepatitis A         Hepatitis B Nasal Spray:       Pneumovax  Saline Nasal Spray     Polio Booster         Tetanus Nausea:       Tuberculosis test or PPD  Vitamin B6 25 mg TID   AVOID:    Dramamine      *Gardasil  Emetrol       Live Poliovirus  Ginger Root 250 mg QID    MMR (measles, mumps &  High Complex Carbs @ Bedtime    rebella)  Sea Bands-Accupressure    Varicella (Chickenpox)  Unisom 1/2 tab TID     *No known complications           If received before Pain:         Known pregnancy;   Darvocet       Resume series after  Lortab        Delivery  Percocet    Yeast:   Tramadol      Femstat  Tylenol 3      Gyne-lotrimin  Ultram       Monistat  Vicodin           MISC:         All Sunscreens           Hair Coloring/highlights          Insect Repellant's          (Including DEET)         Mystic Tans    First Trimester of Pregnancy  The first trimester of pregnancy starts on the first day of your last menstrual period until the end of week 12. This is also called months 1 through 3 of pregnancy. Body changes during your first trimester Your body goes through many changes during pregnancy. The changes usually return to normal after  your baby is born. Physical changes  You may gain or lose weight.  Your breasts may grow larger and hurt. The area around your nipples may get darker.  Dark spots or blotches may develop on your face.  You may have changes in your hair. Health changes  You may feel like you might vomit (nauseous), and you may vomit.  You may have heartburn.  You may have headaches.  You may have trouble pooping (constipation).  Your gums may bleed. Other changes  You may get tired easily.  You may pee (urinate) more often.  Your menstrual periods will stop.  You may not feel hungry.  You may want to eat certain kinds of food.  You may have changes in your emotions from day to day.  You may have more dreams. Follow these instructions at home: Medicines  Take over-the-counter and prescription medicines only as told by your doctor. Some medicines are not safe during pregnancy.  Take a prenatal vitamin that contains at least 600 micrograms (mcg) of folic acid. Eating  and drinking  Eat healthy meals that include: ? Fresh fruits and vegetables. ? Whole grains. ? Good sources of protein, such as meat, eggs, or tofu. ? Low-fat dairy products.  Avoid raw meat and unpasteurized juice, milk, and cheese.  If you feel like you may vomit, or you vomit: ? Eat 4 or 5 small meals a day instead of 3 large meals. ? Try eating a few soda crackers. ? Drink liquids between meals instead of during meals.  You may need to take these actions to prevent or treat trouble pooping: ? Drink enough fluids to keep your pee (urine) pale yellow. ? Eat foods that are high in fiber. These include beans, whole grains, and fresh fruits and vegetables. ? Limit foods that are high in fat and sugar. These include fried or sweet foods. Activity  Exercise only as told by your doctor. Most people can do their usual exercise routine during pregnancy.  Stop exercising if you have cramps or pain in your lower belly  (abdomen) or low back.  Do not exercise if it is too hot or too humid, or if you are in a place of great height (high altitude).  Avoid heavy lifting.  If you choose to, you may have sex unless your doctor tells you not to. Relieving pain and discomfort  Wear a good support bra if your breasts are sore.  Rest with your legs raised (elevated) if you have leg cramps or low back pain.  If you have bulging veins (varicose veins) in your legs: ? Wear support hose as told by your doctor. ? Raise your feet for 15 minutes, 3-4 times a day. ? Limit salt in your food. Safety  Wear your seat belt at all times when you are in a car.  Talk with your doctor if someone is hurting you or yelling at you.  Talk with your doctor if you are feeling sad or have thoughts of hurting yourself. Lifestyle  Do not use hot tubs, steam rooms, or saunas.  Do not douche. Do not use tampons or scented sanitary pads.  Do not use herbal medicines, illegal drugs, or medicines that are not approved by your doctor. Do not drink alcohol.  Do not smoke or use any products that contain nicotine or tobacco. If you need help quitting, ask your doctor.  Avoid cat litter boxes and soil that is used by cats. These carry germs that can cause harm to the baby and can cause a loss of your baby by miscarriage or stillbirth. General instructions  Keep all follow-up visits. This is important.  Ask for help if you need counseling or if you need help with nutrition. Your doctor can give you advice or tell you where to go for help.  Visit your dentist. At home, brush your teeth with a soft toothbrush. Floss gently.  Write down your questions. Take them to your prenatal visits. Where to find more information  American Pregnancy Association: americanpregnancy.org  [employer]: www.acog.org  Office on Women's Health: KeywordPortfolios.com.br Contact a doctor if:  You are  dizzy.  You have a fever.  You have mild cramps or pressure in your lower belly.  You have a nagging pain in your belly area.  You continue to feel like you may vomit, you vomit, or you have watery poop (diarrhea) for 24 hours or longer.  You have a bad-smelling fluid coming from your vagina.  You have pain when you pee.  You are  exposed to a disease that spreads from person to person, such as chickenpox, measles, Zika virus, HIV, or hepatitis. Get help right away if:  You have spotting or bleeding from your vagina.  You have very bad belly cramping or pain.  You have shortness of breath or chest pain.  You have any kind of injury, such as from a fall or a car crash.  You have new or increased pain, swelling, or redness in an arm or leg. Summary  The first trimester of pregnancy starts on the first day of your last menstrual period until the end of week 12 (months 1 through 3).  Eat 4 or 5 small meals a day instead of 3 large meals.  Do not smoke or use any products that contain nicotine or tobacco. If you need help quitting, ask your doctor.  Keep all follow-up visits. This information is not intended to replace advice given to you by your health care provider. Make sure you discuss any questions you have with your health care provider. Document Revised: 01/24/2020 Document Reviewed: 11/30/2019 Elsevier Patient Education  2021 [employer].

## 2020-09-14 DIAGNOSIS — Z8619 Personal history of other infectious and parasitic diseases: Secondary | ICD-10-CM

## 2020-09-14 DIAGNOSIS — O4690 Antepartum hemorrhage, unspecified, unspecified trimester: Secondary | ICD-10-CM | POA: Insufficient documentation

## 2020-09-14 HISTORY — DX: Personal history of other infectious and parasitic diseases: Z86.19

## 2020-09-18 LAB — CERVICOVAGINAL ANCILLARY ONLY
Bacterial Vaginitis (gardnerella): POSITIVE — AB
Candida Glabrata: NEGATIVE
Candida Vaginitis: POSITIVE — AB
Chlamydia: NEGATIVE
Comment: NEGATIVE
Comment: NEGATIVE
Comment: NEGATIVE
Comment: NEGATIVE
Comment: NEGATIVE
Comment: NORMAL
Neisseria Gonorrhea: NEGATIVE
Trichomonas: NEGATIVE

## 2020-09-19 ENCOUNTER — Other Ambulatory Visit: Payer: Self-pay | Admitting: Certified Nurse Midwife

## 2020-09-19 ENCOUNTER — Telehealth: Payer: Self-pay

## 2020-09-19 DIAGNOSIS — B373 Candidiasis of vulva and vagina: Secondary | ICD-10-CM

## 2020-09-19 DIAGNOSIS — N760 Acute vaginitis: Secondary | ICD-10-CM

## 2020-09-19 DIAGNOSIS — B9689 Other specified bacterial agents as the cause of diseases classified elsewhere: Secondary | ICD-10-CM

## 2020-09-19 DIAGNOSIS — B3731 Acute candidiasis of vulva and vagina: Secondary | ICD-10-CM

## 2020-09-19 MED ORDER — TERCONAZOLE 0.4 % VA CREA: 1.0000 | TOPICAL_CREAM | Freq: Every day | VAGINAL | 0 refills | Status: DC

## 2020-09-19 MED ORDER — METRONIDAZOLE 500 MG PO TABS: 500.0000 mg | ORAL_TABLET | Freq: Two times a day (BID) | ORAL | 0 refills | Status: AC

## 2020-09-19 NOTE — Telephone Encounter (Signed)
mychart message sent to patient re: no visitors 

## 2020-09-19 NOTE — Progress Notes (Signed)
Rx Flagyl and Terazole, see orders.    Natasha Sloan, CNM [facility], [facility] 09/19/20 10:29 AM

## 2020-09-20 ENCOUNTER — Ambulatory Visit (INDEPENDENT_AMBULATORY_CARE_PROVIDER_SITE_OTHER): Payer: Self-pay | Admitting: Certified Nurse Midwife

## 2020-09-20 ENCOUNTER — Emergency Department
Admission: EM | Admit: 2020-09-20 | Discharge: 2020-09-20 | Disposition: A | Discharge status: Home / Self Care | Payer: Medicaid Other | Attending: Emergency Medicine | Admitting: Emergency Medicine

## 2020-09-20 ENCOUNTER — Emergency Department: Payer: Medicaid Other

## 2020-09-20 ENCOUNTER — Encounter: Payer: Self-pay | Admitting: Emergency Medicine

## 2020-09-20 ENCOUNTER — Other Ambulatory Visit: Payer: Self-pay

## 2020-09-20 VITALS — BP 102/62 | HR 114 | Ht 62.0 in | Wt 139.2 lb

## 2020-09-20 DIAGNOSIS — J45909 Unspecified asthma, uncomplicated: Secondary | ICD-10-CM | POA: Insufficient documentation

## 2020-09-20 DIAGNOSIS — O209 Hemorrhage in early pregnancy, unspecified: Secondary | ICD-10-CM | POA: Insufficient documentation

## 2020-09-20 DIAGNOSIS — Z3A15 15 weeks gestation of pregnancy: Secondary | ICD-10-CM | POA: Diagnosis not present

## 2020-09-20 DIAGNOSIS — N939 Abnormal uterine and vaginal bleeding, unspecified: Secondary | ICD-10-CM

## 2020-09-20 DIAGNOSIS — Z3A14 14 weeks gestation of pregnancy: Secondary | ICD-10-CM | POA: Diagnosis not present

## 2020-09-20 DIAGNOSIS — Z3482 Encounter for supervision of other normal pregnancy, second trimester: Secondary | ICD-10-CM

## 2020-09-20 LAB — CBC WITH DIFFERENTIAL/PLATELET
Abs Immature Granulocytes: 0.02 10*3/uL (ref 0.00–0.07)
Basophils Absolute: 0 10*3/uL (ref 0.0–0.1)
Basophils Relative: 0 %
Eosinophils Absolute: 0.2 10*3/uL (ref 0.0–0.5)
Eosinophils Relative: 3 %
HCT: 32.2 % — ABNORMAL LOW (ref 36.0–46.0)
Hemoglobin: 11.1 g/dL — ABNORMAL LOW (ref 12.0–15.0)
Immature Granulocytes: 0 %
Lymphocytes Relative: 4 %
Lymphs Abs: 0.2 10*3/uL — ABNORMAL LOW (ref 0.7–4.0)
MCH: 26.4 pg (ref 26.0–34.0)
MCHC: 34.5 g/dL (ref 30.0–36.0)
MCV: 76.5 fL — ABNORMAL LOW (ref 80.0–100.0)
Monocytes Absolute: 0.5 10*3/uL (ref 0.1–1.0)
Monocytes Relative: 9 %
Neutro Abs: 4.5 10*3/uL (ref 1.7–7.7)
Neutrophils Relative %: 84 %
Platelets: 188 10*3/uL (ref 150–400)
RBC: 4.21 MIL/uL (ref 3.87–5.11)
RDW: 16.2 % — ABNORMAL HIGH (ref 11.5–15.5)
WBC: 5.4 10*3/uL (ref 4.0–10.5)
nRBC: 0 % (ref 0.0–0.2)

## 2020-09-20 LAB — BASIC METABOLIC PANEL
Anion gap: 12 (ref 5–15)
BUN: 6 mg/dL (ref 6–20)
CO2: 21 mmol/L — ABNORMAL LOW (ref 22–32)
Calcium: 8.6 mg/dL — ABNORMAL LOW (ref 8.9–10.3)
Chloride: 102 mmol/L (ref 98–111)
Creatinine, Ser: 0.44 mg/dL (ref 0.44–1.00)
GFR, Estimated: >60 mL/min (ref >60)
Glucose, Bld: 110 mg/dL — ABNORMAL HIGH (ref 70–99)
Potassium: 3.5 mmol/L (ref 3.5–5.1)
Sodium: 135 mmol/L (ref 135–145)

## 2020-09-20 LAB — URINALYSIS, COMPLETE (UACMP) WITH MICROSCOPIC
Bilirubin Urine: NEGATIVE
Glucose, UA: NEGATIVE mg/dL
Hgb urine dipstick: NEGATIVE
Ketones, ur: 80 mg/dL — AB
Leukocytes,Ua: NEGATIVE
Nitrite: NEGATIVE
Protein, ur: NEGATIVE mg/dL
Specific Gravity, Urine: 1.021 (ref 1.005–1.030)
pH: 6 (ref 5.0–8.0)

## 2020-09-20 LAB — ABO/RH: ABO/RH(D): O POS

## 2020-09-20 LAB — HCG, QUANTITATIVE, PREGNANCY: hCG, Beta Chain, Quant, S: 48771 m[IU]/mL — ABNORMAL HIGH (ref <5)

## 2020-09-20 LAB — POC URINE PREG, ED: Preg Test, Ur: POSITIVE — AB

## 2020-09-20 NOTE — ED Triage Notes (Signed)
Pt to ED via POV stating Wednesday she started having vaginal bleeding and cramping that has continued to get worse. Pt states that she is [redacted] weeks pregnant, pt reports bleeding through out pregnancy. Pt states that she has used 2 pads since yesterday. Bleeding started off brownish, then went to bright red, and is now light pink in color. Pt denies passing blood clots. Pt is in NAD. Pt states that she sees [facility]. Pt is G4P1

## 2020-09-20 NOTE — ED Notes (Signed)
RN attempted to obtain fetal heart tones. RN was unable to. Ultrasound at bedside. Primary provider notified.  Pt states coming in due to vaginal bleeding. Pt states this is her 4th pregnancy, but only 1 birth so far. Pt states she has been bleeding the entire pregnancy on and off, but it has increased and she is having abdominal cramping now as well.

## 2020-09-20 NOTE — Progress Notes (Signed)
Patient was scheduled for a NOB intake however she is c/o moderate cramping since yesterday. States she stopping bleeding around 11PM last night 09/19/20. She is in obvious discomfort. Per Serafina Royals CNM patient needs to report to the ED. Ultrasound not available at [facility]. Patient was agreeable.

## 2020-09-20 NOTE — ED Provider Notes (Signed)
[facility]-EMERGENCY DEPARTMENT  ____________________________________________  Time seen: Approximately 9:06 PM  I have reviewed the triage vital signs and the nursing notes.   HISTORY  Chief Complaint Vaginal Bleeding and Abdominal Pain   Historian Patient     HPI Natasha Sloan is a 30 y.o. female presents to the emergency department with vaginal bleeding that seem to be worsening today.  Patient is currently [redacted] weeks pregnant.  She is G4, P1.  She states that she has had vaginal bleeding throughout her entire pregnancy with reassuring ultrasounds each time.  Patient states that she had originally established her OB/GYN care in [place] but has since moved to [place].  She reports that her OB/GYN told her to seek care in the emergency department if she experiences changes in her vaginal bleeding.  Patient states that she had some low back pain today.  She denies fever and chills.  No dysuria or increased urinary frequency.  No other alleviating measures have been attempted.   Past Medical History:  Diagnosis Date   Anxiety    History of anemia    History of asthma    History of urinary tract infection      Immunizations up to date:  Yes.     Past Medical History:  Diagnosis Date   Anxiety    History of anemia    History of asthma    History of urinary tract infection     Patient Active Problem List   Diagnosis Date Noted   History of chlamydia 09/14/2020   Vaginal bleeding in pregnancy 09/14/2020   Type O blood, Rh positive 09/13/2020   R/O Posttraumatic stress disorder 03/29/2018   Asthma 09/09/2011    Past Surgical History:  Procedure Laterality Date   EYE SURGERY Left 2007   "remove clot"   ROOT CANAL     x1    Prior to Admission medications   Medication Sig Start Date End Date Taking? Authorizing Provider  metroNIDAZOLE (FLAGYL) 500 MG tablet Take 1 tablet (500 mg total) by mouth 2 (two) times daily for 7 days. Patient not taking:  Reported on 09/20/2020 09/19/20 09/26/20  Gunnar Bulla, CNM  Prenatal Vit-Fe Fumarate-FA (MULTIVITAMIN-PRENATAL) 27-0.8 MG TABS tablet Take 1 tablet by mouth daily at 12 noon. 08/28/20 12/06/20  Federico Flake, MD  terconazole (TERAZOL 7) 0.4 % vaginal cream Place 1 applicator vaginally at bedtime. Patient not taking: Reported on 09/20/2020 09/19/20   Gunnar Bulla, CNM    Allergies Patient has no known allergies.  Family History  Problem Relation Age of Onset   Breast cancer Mother    Diabetes Mother    Diabetes Father     Social History Social History   Tobacco Use   Smoking status: Never Smoker   Smokeless tobacco: Never Used  Building services engineer Use: Never used  Substance Use Topics   Alcohol use: Not Currently    Comment: last use - 07/01/2020   Drug use: Not Currently    Types: Cocaine    Comment: last used cocaine-  first week 07/2020     Review of Systems  Constitutional: No fever/chills Eyes:  No discharge ENT: No upper respiratory complaints. Respiratory: no cough. No SOB/ use of accessory muscles to breath Gastrointestinal:   No nausea, no vomiting.  No diarrhea.  No constipation. Genitourinary: Patient has vaginal bleeding.  Musculoskeletal: Negative for musculoskeletal pain. Skin: Negative for rash, abrasions, lacerations, ecchymosis.    ____________________________________________   PHYSICAL EXAM:  VITAL  SIGNS: ED Triage Vitals  Enc Vitals Group     BP 09/20/20 1407 94/69     Pulse Rate 09/20/20 1407 (!) 125     Resp 09/20/20 1407 18     Temp 09/20/20 1407 98.9 F (37.2 C)     Temp Source 09/20/20 1407 Oral     SpO2 09/20/20 1407 99 %     Weight 09/20/20 1408 139 lb (63 kg)     Height 09/20/20 1408 5\' 2"  (1.575 m)     Head Circumference --      Peak Flow --      Pain Score 09/20/20 1421 10     Pain Loc --      Pain Edu? --      Excl. in GC? --      Constitutional: Alert and oriented. Well appearing  and in no acute distress. Eyes: Conjunctivae are normal. PERRL. EOMI. Head: Atraumatic. Cardiovascular: Normal rate, regular rhythm. Normal S1 and S2.  Good peripheral circulation. Respiratory: Normal respiratory effort without tachypnea or retractions. Lungs CTAB. Good air entry to the bases with no decreased or absent breath sounds Gastrointestinal: Bowel sounds x 4 quadrants. Soft and nontender to palpation. No guarding or rigidity. No distention. Musculoskeletal: Full range of motion to all extremities. No obvious deformities noted Neurologic:  Normal for age. No gross focal neurologic deficits are appreciated.  Skin:  Skin is warm, dry and intact. No rash noted. Psychiatric: Mood and affect are normal for age. Speech and behavior are normal.   ____________________________________________   LABS (all labs ordered are listed, but only abnormal results are displayed)  Labs Reviewed  CBC WITH DIFFERENTIAL/PLATELET - Abnormal; Notable for the following components:      Result Value   Hemoglobin 11.1 (*)    HCT 32.2 (*)    MCV 76.5 (*)    RDW 16.2 (*)    Lymphs Abs 0.2 (*)    All other components within normal limits  HCG, QUANTITATIVE, PREGNANCY - Abnormal; Notable for the following components:   hCG, Beta Chain, Quant, S 48,771 (*)    All other components within normal limits  BASIC METABOLIC PANEL - Abnormal; Notable for the following components:   CO2 21 (*)    Glucose, Bld 110 (*)    Calcium 8.6 (*)    All other components within normal limits  URINALYSIS, COMPLETE (UACMP) WITH MICROSCOPIC - Abnormal; Notable for the following components:   Color, Urine YELLOW (*)    APPearance HAZY (*)    Ketones, ur 80 (*)    Bacteria, UA RARE (*)    All other components within normal limits  POC URINE PREG, ED - Abnormal; Notable for the following components:   Preg Test, Ur Positive (*)    All other components within normal limits  ABO/RH    ____________________________________________  EKG   ____________________________________________  RADIOLOGY 09/22/20, personally viewed and evaluated these images (plain radiographs) as part of my medical decision making, as well as reviewing the written report by the radiologist.  [facility]  Result Date: 09/20/2020 CLINICAL DATA:  Vaginal bleeding EXAM: LIMITED OBSTETRIC ULTRASOUND FINDINGS: Number of Fetuses: 1 Heart Rate:  164 bpm Movement: Present Presentation: Variable Placental Location: Posterior Previa: Absent Amniotic Fluid (Subjective):  Within normal limits. BPD: 2.71 cm 14 w  6 d MATERNAL FINDINGS: Cervix:  Appears closed. Uterus/Adnexae: No abnormality visualized. IMPRESSION: Single live intrauterine gestation at 14 weeks 6 days. No acute abnormality is noted. This  exam is performed on an emergent basis and does not comprehensively evaluate fetal size, dating, or anatomy; follow-up complete OB US should be considered if further fetal assessment is warranted. Electronically Signed   By: Alcide Clever M.D.   On: 09/20/2020 17:00    ____________________________________________    PROCEDURES  Procedure(s) performed:     Procedures     Medications - No data to display   ____________________________________________   INITIAL IMPRESSION / ASSESSMENT AND PLAN / ED COURSE  Pertinent labs & imaging results that were available during my care of the patient were reviewed by me and considered in my medical decision making (see chart for details).      Assessment and plan Vaginal bleeding 30 year old female presents to the emergency department with vaginal bleeding that seem to worsen today.  Patient has had vaginal bleeding throughout her pregnancy.  H&H were consistent with patient's baseline labs.  BMP was reassuring.  Urinalysis showed rare bacteria but no other concerning findings for UTI.  Dedicated OB ultrasound showed a single viable intrauterine  pregnancy at 14 weeks 6 days and 164 bpm.  Reassurance was given.  Recommended following up with patient's OB/GYN.  Return precautions were given to return with new or worsening symptoms.  All patient questions were answered.     ____________________________________________  FINAL CLINICAL IMPRESSION(S) / ED DIAGNOSES  Final diagnoses:  Vaginal bleeding      NEW MEDICATIONS STARTED DURING THIS VISIT:  ED Discharge Orders    None          This chart was dictated using voice recognition software/Dragon. Despite best efforts to proofread, errors can occur which can change the meaning. Any change was purely unintentional.     Gasper Lloyd 09/20/20 2114    Jene Every, MD 09/20/20 2158

## 2020-09-20 NOTE — Discharge Instructions (Signed)
Please continue to monitor symptoms at home. If you have changes in your vaginal bleeding, please return to the emergency department for reevaluation.

## 2020-09-22 NOTE — Progress Notes (Signed)
I have reviewed the record and concur with patient management and plan of care.    Serafina Royals, CNM [facility], [facility] 09/22/20 7:53 PM

## 2020-09-27 ENCOUNTER — Ambulatory Visit (INDEPENDENT_AMBULATORY_CARE_PROVIDER_SITE_OTHER): Payer: Self-pay | Admitting: Certified Nurse Midwife

## 2020-09-27 ENCOUNTER — Encounter: Payer: Self-pay | Admitting: Certified Nurse Midwife

## 2020-09-27 ENCOUNTER — Other Ambulatory Visit: Payer: Self-pay | Admitting: Certified Nurse Midwife

## 2020-09-27 ENCOUNTER — Other Ambulatory Visit: Payer: Self-pay

## 2020-09-27 VITALS — BP 99/66 | HR 91 | Wt 138.9 lb

## 2020-09-27 DIAGNOSIS — Z3A17 17 weeks gestation of pregnancy: Secondary | ICD-10-CM | POA: Diagnosis not present

## 2020-09-27 DIAGNOSIS — Z3482 Encounter for supervision of other normal pregnancy, second trimester: Secondary | ICD-10-CM

## 2020-09-27 DIAGNOSIS — Z6740 Type O blood, Rh positive: Secondary | ICD-10-CM

## 2020-09-27 DIAGNOSIS — O0930 Supervision of pregnancy with insufficient antenatal care, unspecified trimester: Secondary | ICD-10-CM

## 2020-09-27 DIAGNOSIS — Z1379 Encounter for other screening for genetic and chromosomal anomalies: Secondary | ICD-10-CM

## 2020-09-27 LAB — POCT URINALYSIS DIPSTICK OB
Bilirubin, UA: NEGATIVE
Blood, UA: NEGATIVE
Glucose, UA: NEGATIVE
Leukocytes, UA: NEGATIVE
Nitrite, UA: NEGATIVE
POC,PROTEIN,UA: NEGATIVE
Spec Grav, UA: 1.01 (ref 1.010–1.025)
Urobilinogen, UA: 0.2 E.U./dL
pH, UA: 6.5 (ref 5.0–8.0)

## 2020-09-27 NOTE — Patient Instructions (Signed)
Round Ligament Pain  The round ligament is a cord of muscle and tissue that helps support the uterus. It can become a source of pain during pregnancy if it becomes stretched or twisted as the baby grows. The pain usually begins in the second trimester (13-28 weeks) of pregnancy, and it can come and go until the baby is delivered. It is not a serious problem, and it does not cause harm to the baby. Round ligament pain is usually a short, sharp, and pinching pain, but it can also be a dull, lingering, and aching pain. The pain is felt in the lower side of the abdomen or in the groin. It usually starts deep in the groin and moves up to the outside of the hip area. The pain may occur when you:  Suddenly change position, such as quickly going from a sitting to standing position.  Roll over in bed.  Cough or sneeze.  Do physical activity. Follow these instructions at home:  Watch your condition for any changes.  When the pain starts, relax. Then try any of these methods to help with the pain: ? Sitting down. ? Flexing your knees up to your abdomen. ? Lying on your side with one pillow under your abdomen and another pillow between your legs. ? Sitting in a warm bath for 15-20 minutes or until the pain goes away.  Take over-the-counter and prescription medicines only as told by your health care provider.  Move slowly when you sit down or stand up.  Avoid long walks if they cause pain.  Stop or reduce your physical activities if they cause pain.  Keep all follow-up visits as told by your health care provider. This is important.   Contact a health care provider if:  Your pain does not go away with treatment.  You feel pain in your back that you did not have before.  Your medicine is not helping. Get help right away if:  You have a fever or chills.  You develop uterine contractions.  You have vaginal bleeding.  You have nausea or vomiting.  You have diarrhea.  You have pain  when you urinate. Summary  Round ligament pain is felt in the lower abdomen or groin. It is usually a short, sharp, and pinching pain. It can also be a dull, lingering, and aching pain.  This pain usually begins in the second trimester (13-28 weeks). It occurs because the uterus is stretching with the growing baby, and it is not harmful to the baby.  You may notice the pain when you suddenly change position, when you cough or sneeze, or during physical activity.  Relaxing, flexing your knees to your abdomen, lying on one side, or taking a warm bath may help to get rid of the pain.  Get help from your health care provider if the pain does not go away or if you have vaginal bleeding, nausea, vomiting, diarrhea, or painful urination. This information is not intended to replace advice given to you by your health care provider. Make sure you discuss any questions you have with your health care provider. Document Revised: 02/02/2018 Document Reviewed: 02/02/2018 Elsevier Patient Education  2021 [place] of Pregnancy  The second trimester of pregnancy is from week 13 through week 27. This is also called months 4 through 6 of pregnancy. This is often the time when you feel your best. During the second trimester:  Morning sickness is less or has stopped.  You may have  more energy.  You may feel hungry more often. At this time, your unborn baby (fetus) is growing very fast. At the end of the sixth month, the unborn baby may be up to 12 inches long and weigh about 1 pounds. You will likely start to feel the baby move between 16 and 20 weeks of pregnancy. Body changes during your second trimester Your body continues to go through many changes during this time. The changes vary and generally return to normal after the baby is born. Physical changes  You will gain more weight.  You may start to get stretch marks on your hips, belly (abdomen), and breasts.  Your breasts  will grow and may hurt.  Dark spots or blotches may develop on your face.  A dark line from your belly button to the pubic area (linea nigra) may appear.  You may have changes in your hair. Health changes  You may have headaches.  You may have heartburn.  You may have trouble pooping (constipation).  You may have hemorrhoids or swollen, bulging veins (varicose veins).  Your gums may bleed.  You may pee (urinate) more often.  You may have back pain. Follow these instructions at home: Medicines  Take over-the-counter and prescription medicines only as told by your doctor. Some medicines are not safe during pregnancy.  Take a prenatal vitamin that contains at least 600 micrograms (mcg) of folic acid. Eating and drinking  Eat healthy meals that include: ? Fresh fruits and vegetables. ? Whole grains. ? Good sources of protein, such as meat, eggs, or tofu. ? Low-fat dairy products.  Avoid raw meat and unpasteurized juice, milk, and cheese.  You may need to take these actions to prevent or treat trouble pooping: ? Drink enough fluids to keep your pee (urine) pale yellow. ? Eat foods that are high in fiber. These include beans, whole grains, and fresh fruits and vegetables. ? Limit foods that are high in fat and sugar. These include fried or sweet foods. Activity  Exercise only as told by your doctor. Most people can do their usual exercise during pregnancy. Try to exercise for 30 minutes at least 5 days a week.  Stop exercising if you have pain or cramps in your belly or lower back.  Do not exercise if it is too hot or too humid, or if you are in a place of great height (high altitude).  Avoid heavy lifting.  If you choose to, you may have sex unless your doctor tells you not to. Relieving pain and discomfort  Wear a good support bra if your breasts are sore.  Take warm water baths (sitz baths) to soothe pain or discomfort caused by hemorrhoids. Use hemorrhoid cream  if your doctor approves.  Rest with your legs raised (elevated) if you have leg cramps or low back pain.  If you develop bulging veins in your legs: ? Wear support hose as told by your doctor. ? Raise your feet for 15 minutes, 3-4 times a day. ? Limit salt in your food. Safety  Wear your seat belt at all times when you are in a car.  Talk with your doctor if someone is hurting you or yelling at you a lot. Lifestyle  Do not use hot tubs, steam rooms, or saunas.  Do not douche. Do not use tampons or scented sanitary pads.  Avoid cat litter boxes and soil used by cats. These carry germs that can harm your baby and can cause a loss of your  of your baby by miscarriage or stillbirth.  Do not use herbal medicines, illegal drugs, or medicines that are not approved by your doctor. Do not drink alcohol.  Do not smoke or use any products that contain nicotine or tobacco. If you need help quitting, ask your doctor. General instructions  Keep all follow-up visits. This is important.  Ask your doctor about local prenatal classes.  Ask your doctor about the right foods to eat or for help finding a counselor. Where to find more information  American Pregnancy Association: americanpregnancy.org  American College of Obstetricians and Gynecologists: www.acog.org  Office on Women's Health: womenshealth.gov/pregnancy Contact a doctor if:  You have a headache that does not go away when you take medicine.  You have changes in how you see, or you see spots in front of your eyes.  You have mild cramps, pressure, or pain in your lower belly.  You continue to feel like you may vomit (nauseous), you vomit, or you have watery poop (diarrhea).  You have bad-smelling fluid coming from your vagina.  You have pain when you pee or your pee smells bad.  You have very bad swelling of your face, hands, ankles, feet, or legs.  You have a fever. Get help right away if:  You are leaking  fluid from your vagina.  You have spotting or bleeding from your vagina.  You have very bad belly cramping or pain.  You have trouble breathing.  You have chest pain.  You faint.  You have not felt your baby move for the time period told by your doctor.  You have new or increased pain, swelling, or redness in an arm or leg. Summary  The second trimester of pregnancy is from week 13 through week 27 (months 4 through 6).  Eat healthy meals.  Exercise as told by your doctor. Most people can do their usual exercise during pregnancy.  Do not use herbal medicines, illegal drugs, or medicines that are not approved by your doctor. Do not drink alcohol.  Call your doctor if you get sick or if you notice anything unusual about your pregnancy. This information is not intended to replace advice given to you by your health care provider. Make sure you discuss any questions you have with your health care provider. Document Revised: 01/24/2020 Document Reviewed: 11/30/2019 Elsevier Patient Education  2021 Elsevier Inc.  

## 2020-09-27 NOTE — Progress Notes (Signed)
NEW OB HISTORY AND PHYSICAL  SUBJECTIVE:       Natasha Sloan is a 30 y.o. G31P1021 female, Patient's last menstrual period was 05/29/2020 (exact date)., Estimated Date of Delivery: 03/05/21, [redacted]w[redacted]d, presents today for establishment of Prenatal Care.  She has no unusual complaints. History significant for second trimester vaginal bleeding, last episode one (1) week ago; for further details, please see ER note.   Desires genetic testing.   Denies breathing difficulty, respiratory distress, chest pain, abdominal pain, vaginal bleeding, and leg swelling or pain.   Gynecologic History  Patient's last menstrual period was 05/29/2020 (exact date).   Contraception: none   Last Pap: 02/2018. Results were: Neg. Per patient, records being sent.  Obstetric History  OB History  Gravida Para Term Preterm AB Living  4 1 1  0 2 1  SAB IAB Ectopic Multiple Live Births  2 0 0 0 1    # Outcome Date GA Lbr Len/2nd Weight Sex Delivery Anes PTL Lv  4 Current           3 SAB 05/2020          2 SAB 2012          1 Term 04/08/10 [redacted]w[redacted]d  2608 g M         Birth Comments: [facility]    Past Medical History:  Diagnosis Date  . Anxiety   . History of anemia   . History of asthma   . History of urinary tract infection     Past Surgical History:  Procedure Laterality Date  . EYE SURGERY Left 2007   "remove clot"  . ROOT CANAL     x1    Current Outpatient Medications on File Prior to Visit  Medication Sig Dispense Refill  . Prenatal Vit-Fe Fumarate-FA (MULTIVITAMIN-PRENATAL) 27-0.8 MG TABS tablet Take 1 tablet by mouth daily at 12 noon. 100 tablet 0  . terconazole (TERAZOL 7) 0.4 % vaginal cream Place 1 applicator vaginally at bedtime. (Patient not taking: No sig reported) 45 g 0   No current facility-administered medications on file prior to visit.    No Known Allergies  Social History   Socioeconomic History  . Marital status: Single    Spouse name: Not on file  . Number of children: 1  .  Years of education: 16  . Highest education level: High school graduate  Occupational History  . Not on file  Tobacco Use  . Smoking status: Never Smoker  . Smokeless tobacco: Never Used  Vaping Use  . Vaping Use: Never used  Substance and Sexual Activity  . Alcohol use: Not Currently    Comment: last use - 07/01/2020  . Drug use: Not Currently    Types: Cocaine    Comment: last used cocaine-  first week 07/2020  . Sexual activity: Not Currently    Partners: Male    Birth control/protection: Implant    Comment: Nexplanon removed 10/2019, heavy bleeding  Other Topics Concern  . Not on file  Social History Narrative  . Not on file   Social Determinants of Health   Financial Resource Strain: Not on file  Food Insecurity: Not on file  Transportation Needs: Not on file  Physical Activity: Not on file  Stress: Not on file  Social Connections: Not on file  Intimate Partner Violence: Not At Risk  . Fear of Current or Ex-Partner: No  . Emotionally Abused: No  . Physically Abused: No  . Sexually Abused: No  Family History  Problem Relation Age of Onset  . Breast cancer Mother   . Diabetes Mother   . Diabetes Father     The following portions of the patient's history were reviewed and updated as appropriate: allergies, current medications, past OB history, past medical history, past surgical history, past family history, past social history, and problem list.  REVIEW OF SYSTEMS:   ROS - Negative other than what was reported above. Information obtains verbally from patient.  OBJECTIVE:  BP 99/66   Pulse 91   Wt 63 kg   LMP 05/29/2020 (Exact Date) Comment: bleeding 05/29/20-06/06/20, but may have had SAB at this time  BMI 25.41 kg/m    Initial Physical Exam (New OB)  GENERAL APPEARANCE: alert, well appearing, in no apparent distress, oriented to person, place and time  HEAD: normocephalic, atraumatic  THYROID: no thyromegaly or masses present  BREASTS: patient  declined exam  LUNGS: clear to auscultation, no wheezes, rales or rhonchi, symmetric air entry  HEART: regular rate and rhythm, no murmurs  ABDOMEN: fundus soft, nontender 17 weeks size, FHTs present-148 bpm  EXTREMITIES: no redness or tenderness in the calves or thighs, no edema  SKIN: normal coloration and turgor, no rashes  NEUROLOGIC: alert, oriented, normal speech, no focal findings or movement disorder noted  PELVIC EXAM: Deferred exam  ASSESSMENT:  Supervision of other normal pregnancy   Vaginal bleeding in the second trimester  Late entry prenatal care-17 weeks  Rh positive  Desires genetic screening  PLAN:  Prenatal care  New OB and genetic labs today, see orders  Encouraged to increase protein and water in her diet.  Anticipatory guidance regarding course of prenatal care.   Reviewed red flag symptoms and when to call.  RTC x 3-4 weeks for ANATOMY Ultrasound and ROB with ANNIE or sooner if needed.   Juliann Pares, Student-MidWife [employer] 09/27/20 3:13 PM

## 2020-09-27 NOTE — Progress Notes (Signed)
I have seen, interviewed, and examined the patient in conjunction with the [employer] and affirm the diagnosis and management plan.   Gunnar Bulla, CNM [facility], [facility] 09/27/20 4:42 PM

## 2020-09-28 LAB — CBC WITH DIFFERENTIAL
Basophils Absolute: 0 10*3/uL (ref 0.0–0.2)
Basos: 0 %
EOS (ABSOLUTE): 0.1 10*3/uL (ref 0.0–0.4)
Eos: 1 %
Hematocrit: 33.8 % — ABNORMAL LOW (ref 34.0–46.6)
Hemoglobin: 11.2 g/dL (ref 11.1–15.9)
Immature Grans (Abs): 0 10*3/uL (ref 0.0–0.1)
Immature Granulocytes: 0 %
Lymphocytes Absolute: 1.2 10*3/uL (ref 0.7–3.1)
Lymphs: 24 %
MCH: 26 pg — ABNORMAL LOW (ref 26.6–33.0)
MCHC: 33.1 g/dL (ref 31.5–35.7)
MCV: 79 fL (ref 79–97)
Monocytes Absolute: 0.3 10*3/uL (ref 0.1–0.9)
Monocytes: 7 %
Neutrophils Absolute: 3.5 10*3/uL (ref 1.4–7.0)
Neutrophils: 68 %
RBC: 4.3 x10E6/uL (ref 3.77–5.28)
RDW: 18.2 % — ABNORMAL HIGH (ref 11.7–15.4)
WBC: 5.1 10*3/uL (ref 3.4–10.8)

## 2020-09-28 LAB — VARICELLA ZOSTER ANTIBODY, IGG: Varicella zoster IgG: 194 index (ref >165)

## 2020-09-28 LAB — HCV INTERPRETATION

## 2020-09-28 LAB — RPR: RPR Ser Ql: NONREACTIVE

## 2020-09-28 LAB — RUBELLA SCREEN: Rubella Antibodies, IGG: <0.9 index — ABNORMAL LOW (ref >0.99)

## 2020-09-28 LAB — VIRAL HEPATITIS HBV, HCV
HCV Ab: <0.1 s/co ratio (ref 0.0–0.9)
Hep B Core Total Ab: NEGATIVE
Hep B Surface Ab, Qual: NONREACTIVE
Hepatitis B Surface Ag: NEGATIVE

## 2020-09-28 LAB — HIV ANTIBODY (ROUTINE TESTING W REFLEX): HIV Screen 4th Generation wRfx: NONREACTIVE

## 2020-09-28 LAB — ANTIBODY SCREEN: Antibody Screen: NEGATIVE

## 2020-09-29 LAB — URINE CULTURE

## 2020-09-30 ENCOUNTER — Encounter: Payer: Self-pay | Admitting: Certified Nurse Midwife

## 2020-09-30 DIAGNOSIS — O99891 Other specified diseases and conditions complicating pregnancy: Secondary | ICD-10-CM | POA: Insufficient documentation

## 2020-09-30 DIAGNOSIS — Z2839 Other underimmunization status: Secondary | ICD-10-CM | POA: Insufficient documentation

## 2020-09-30 DIAGNOSIS — O09899 Supervision of other high risk pregnancies, unspecified trimester: Secondary | ICD-10-CM | POA: Insufficient documentation

## 2020-09-30 LAB — MONITOR DRUG PROFILE 14(MW)
Amphetamine Scrn, Ur: NEGATIVE ng/mL
BARBITURATE SCREEN URINE: NEGATIVE ng/mL
BENZODIAZEPINE SCREEN, URINE: NEGATIVE ng/mL
Buprenorphine, Urine: NEGATIVE ng/mL
CANNABINOIDS UR QL SCN: NEGATIVE ng/mL
Cocaine (Metab) Scrn, Ur: NEGATIVE ng/mL
Creatinine(Crt), U: 225.1 mg/dL (ref 20.0–300.0)
Fentanyl, Urine: NEGATIVE pg/mL
Meperidine Screen, Urine: NEGATIVE ng/mL
Methadone Screen, Urine: NEGATIVE ng/mL
OXYCODONE+OXYMORPHONE UR QL SCN: NEGATIVE ng/mL
Opiate Scrn, Ur: NEGATIVE ng/mL
Ph of Urine: 6.8 (ref 4.5–8.9)
Phencyclidine Qn, Ur: NEGATIVE ng/mL
Propoxyphene Scrn, Ur: NEGATIVE ng/mL
SPECIFIC GRAVITY: 1.028
Tramadol Screen, Urine: NEGATIVE ng/mL

## 2020-10-10 ENCOUNTER — Telehealth: Payer: Self-pay

## 2020-10-10 NOTE — Telephone Encounter (Signed)
mychart message sent to patient

## 2020-10-23 ENCOUNTER — Ambulatory Visit (INDEPENDENT_AMBULATORY_CARE_PROVIDER_SITE_OTHER): Payer: Medicaid Other

## 2020-10-23 ENCOUNTER — Ambulatory Visit (INDEPENDENT_AMBULATORY_CARE_PROVIDER_SITE_OTHER): Payer: Medicaid Other | Admitting: Certified Nurse Midwife

## 2020-10-23 ENCOUNTER — Other Ambulatory Visit: Payer: Self-pay

## 2020-10-23 ENCOUNTER — Other Ambulatory Visit: Payer: Self-pay | Admitting: Certified Nurse Midwife

## 2020-10-23 ENCOUNTER — Encounter: Payer: Self-pay | Admitting: Certified Nurse Midwife

## 2020-10-23 VITALS — BP 90/48 | HR 92 | Wt 137.0 lb

## 2020-10-23 DIAGNOSIS — Z3482 Encounter for supervision of other normal pregnancy, second trimester: Secondary | ICD-10-CM

## 2020-10-23 DIAGNOSIS — Z3492 Encounter for supervision of normal pregnancy, unspecified, second trimester: Secondary | ICD-10-CM

## 2020-10-23 DIAGNOSIS — Z3A21 21 weeks gestation of pregnancy: Secondary | ICD-10-CM

## 2020-10-23 LAB — POCT URINALYSIS DIPSTICK OB
Blood, UA: NEGATIVE
Glucose, UA: NEGATIVE
Ketones, UA: NEGATIVE
Nitrite, UA: NEGATIVE
Odor: NEGATIVE
POC,PROTEIN,UA: NEGATIVE
Spec Grav, UA: 1.02 (ref 1.010–1.025)
Urobilinogen, UA: 0.2 E.U./dL
pH, UA: 6.5 (ref 5.0–8.0)

## 2020-10-23 MED ORDER — ALBUTEROL SULFATE HFA 108 (90 BASE) MCG/ACT IN AERS: 2.0000 | INHALATION_SPRAY | Freq: Four times a day (QID) | RESPIRATORY_TRACT | 2 refills | Status: DC | PRN

## 2020-10-23 NOTE — Progress Notes (Signed)
Rob- spotting x 2 on Friday.

## 2020-10-23 NOTE — Progress Notes (Signed)
ROB doing well. Feels good movement. Discussed anatomy u/s results today. All questions answered. Scan incomplete for spine. Pt request medication for her asthma states she was using albuterol and got it from her PCP when she lived in [place]. Orders placed. Follow up 3 wks for completion of anatomy scan and ROB with [facility].    Patient Name: Natasha Sloan DOB: 03-Dec-1990 MRN: [id number] ULTRASOUND REPORT  Location: [facility] Date of Service: 10/23/2020   Indications:Anatomy Ultrasound Findings:  Mason Jim intrauterine pregnancy is visualized with FHR at 147 BPM. Biometrics give an (U/S) Gestational age of [redacted]w[redacted]d and an (U/S) EDD of 03/18/2021; this correlates with the clinically established Estimated Date of Delivery: 03/15/21  Fetal presentation is Cephalic.  EFW: 281 g ( 10 oz). Fetal Percentile  Placenta: posterior. Grade: 1 AFI: subjectively normal.  Anatomic survey is incomplete for spine and normal; Gender - female.    Right Ovary is normal in appearance.  Left Ovary is normal appearance. Survey of the adnexa demonstrates no adnexal masses. There is no free peritoneal fluid in the cul de sac.  Impression: 1. [redacted]w[redacted]d Viable Singleton Intrauterine pregnancy by U/S. 2. (U/S) EDD is consistent with Clinically established Estimated Date of Delivery: 03/05/21 . 3. Incomplete for fetal spine.  Recommendations: 1.Clinical correlation with the patient's History and Physical Exam.   Jenine M. Marciano Sequin    RDMS

## 2020-10-23 NOTE — Patient Instructions (Signed)

## 2020-11-12 ENCOUNTER — Telehealth: Payer: Self-pay | Admitting: Certified Nurse Midwife

## 2020-11-12 NOTE — Telephone Encounter (Signed)
New message    Patient C/o symptoms did not specific asking for a call back from the nurse to discuss.

## 2020-11-12 NOTE — Telephone Encounter (Signed)
Pt states she has a light yellow discharge with foul odor x 2 days.   + for new sex partner.  NO VB NO redness or itching.  NO change in detergent or body wash.   ? BV  Offered appt with AT 3/16 at 2pm. Pt can no come in due to getting her child from school. Will keep 3/17 appt. I will contact her if 3/16 am appt becomes available. Pt voiced understanding.

## 2020-11-14 ENCOUNTER — Other Ambulatory Visit: Payer: Self-pay | Admitting: Certified Nurse Midwife

## 2020-11-14 ENCOUNTER — Other Ambulatory Visit (HOSPITAL_COMMUNITY)
Admission: RE | Admit: 2020-11-14 | Discharge: 2020-11-14 | Disposition: A | Discharge status: Home / Self Care | Payer: Medicaid Other | Source: Ambulatory Visit | Attending: Certified Nurse Midwife | Admitting: Certified Nurse Midwife

## 2020-11-14 ENCOUNTER — Ambulatory Visit (INDEPENDENT_AMBULATORY_CARE_PROVIDER_SITE_OTHER): Payer: Medicaid Other | Admitting: Certified Nurse Midwife

## 2020-11-14 ENCOUNTER — Encounter: Payer: Self-pay | Admitting: Certified Nurse Midwife

## 2020-11-14 ENCOUNTER — Other Ambulatory Visit: Payer: Self-pay

## 2020-11-14 ENCOUNTER — Other Ambulatory Visit: Payer: Medicaid Other

## 2020-11-14 VITALS — BP 93/57 | HR 87 | Wt 140.3 lb

## 2020-11-14 DIAGNOSIS — N898 Other specified noninflammatory disorders of vagina: Secondary | ICD-10-CM | POA: Insufficient documentation

## 2020-11-14 DIAGNOSIS — Z8742 Personal history of other diseases of the female genital tract: Secondary | ICD-10-CM

## 2020-11-14 DIAGNOSIS — Z3A22 22 weeks gestation of pregnancy: Secondary | ICD-10-CM | POA: Insufficient documentation

## 2020-11-14 DIAGNOSIS — Z3482 Encounter for supervision of other normal pregnancy, second trimester: Secondary | ICD-10-CM

## 2020-11-14 DIAGNOSIS — Z3689 Encounter for other specified antenatal screening: Secondary | ICD-10-CM

## 2020-11-14 DIAGNOSIS — O26892 Other specified pregnancy related conditions, second trimester: Secondary | ICD-10-CM | POA: Insufficient documentation

## 2020-11-14 DIAGNOSIS — Z3A21 21 weeks gestation of pregnancy: Secondary | ICD-10-CM

## 2020-11-14 MED ORDER — METRONIDAZOLE 500 MG PO TABS
500.0000 mg | ORAL_TABLET | Freq: Two times a day (BID) | ORAL | 0 refills | Status: DC
Start: 2020-11-14 — End: 2020-12-30

## 2020-11-14 NOTE — Progress Notes (Signed)
ROB- Reports increased clear,white, yellow mucousy vaginal discharge with odor. Patient self collected swab today. Patient request treatment prior to results, Rx: Flagyl, see orders.   Patient expressed concern about not feeling baby move or flutters yet. Reassurance provided.    Anticipatory guidance regarding course of prenatal care. Reviewed red flag symptoms and when to call. Patient has Paramedic. RTC x 4 weeks for ROB with ANNIE or sooner if needed.  Juliann Pares, Student-MidWife [employer] 11/14/20 4:49 PM

## 2020-11-14 NOTE — Progress Notes (Signed)
As per patient did not felt fetal movement from past couple of days noticed little blood last week and noticed discharge from 5 days--white and yellowish mucus and denies any pain.

## 2020-11-14 NOTE — Patient Instructions (Signed)
Vaginitis  Vaginitis is irritation and swelling of the vagina. Treatment will depend on the cause. What are the causes? It can be caused by:  Bacteria.  Yeast.  A parasite.  A virus.  Low hormone levels.  Bubble baths, scented tampons, and feminine sprays. Other things can change the balance of the yeast and bacteria that live in the vagina. These include:  Antibiotic medicines.  Not being clean enough.  Some birth control methods.  Sex.  Infection.  Diabetes.  A weakened body defense system (immune system). What increases the risk?  Smoking or being around someone who smokes.  Using washes (douches), scented tampons, or scented pads.  Wearing tight pants or thong underwear.  Using birth control pills or an IUD.  Having sex without a condom or having a lot of partners.  Having an STI.  Using a certain product to kill sperm (nonoxynol-9).  Eating foods that are high in sugar.  Having diabetes.  Having low levels of a female hormone.  Having a weakened body defense system.  Being pregnant or breastfeeding. What are the signs or symptoms?  Fluid coming from the vagina that is not normal.  A bad smell.  Itching, pain, or swelling.  Pain with sex.  Pain or burning when you pee (urinate). Sometimes there are no symptoms. How is this treated? Treatment may include:  Antibiotic creams or pills.  Antifungal medicines.  Medicines to ease symptoms if you have a virus. Your sex partner should also be treated.  Estrogen medicines.  Avoiding scented soaps, sprays, or douches.  Stopping use of products that caused irritation and then using a cream to treat symptoms. Follow these instructions at home: Lifestyle  Keep the area around your vagina clean and dry. ? Avoid using soap. ? Rinse the area with water.  Until your doctor says it is okay: ? Do not use washes for the vagina. ? Do not use tampons. ? Do not have sex.  Wipe from front to  back after going to the bathroom.  When your doctor says it is okay, practice safe sex and use condoms. General instructions  Take over-the-counter and prescription medicines only as told by your doctor.  If you were prescribed an antibiotic medicine, take or use it as told by your doctor. Do not stop taking or using it even if you start to feel better.  Keep all follow-up visits. How is this prevented?  Do not use things that can irritate the vagina, such as fabric softeners. Avoid these products if they are scented: ? Sprays. ? Detergents. ? Tampons. ? Products for cleaning the vagina. ? Soaps or bubble baths.  Let air reach your vagina. To do this: ? Wear cotton underwear. ? Do not wear:  Underwear while you sleep.  Tight pants.  Thong underwear.  Underwear or nylons without a cotton panel. ? Take off any wet clothing, such as bathing suits, as soon as you can. ? Practice safe sex and use condoms. Contact a doctor if:  You have pain in your belly or in the area between your hips.  You have a fever or chills.  Your symptoms last for more than 2-3 days. Get help right away if:  You have a fever and your symptoms get worse all of a sudden. Summary  Vaginitis is irritation and swelling of the vagina.  Treatment will depend on the cause of the condition.  Do not use washes or tampons or have sex until your doctor says it   is okay. This information is not intended to replace advice given to you by your health care provider. Make sure you discuss any questions you have with your health care provider. Document Revised: 02/15/2020 Document Reviewed: 02/15/2020 Elsevier Patient Education  2021 [place].    Metronidazole tablets or capsules What is this medicine? METRONIDAZOLE (me troe NI da zole) is an antiinfective. It is used to treat certain kinds of bacterial and protozoal infections. It will not work for colds, flu, or other viral infections. This medicine  may be used for other purposes; ask your health care provider or pharmacist if you have questions. COMMON BRAND NAME(S): Flagyl What should I tell my health care provider before I take this medicine? They need to know if you have any of these conditions:  Cockayne syndrome  history of blood diseases such as sickle cell anemia, anemia, or leukemia  if you often drink alcohol  irregular heartbeat or rhythm  kidney disease  liver disease  yeast or fungal infection  an unusual or allergic reaction to metronidazole, nitroimidazoles, or other medicines, foods, dyes, or preservatives  pregnant or trying to get pregnant  breast-feeding How should I use this medicine? Take this medicine by mouth with water. Take it as directed on the prescription label at the same time every day. Take all of this medicine unless your health care provider tells you to stop it early. Keep taking it even if you think you are better. Talk to your health care provider about the use of this medicine in children. While it may be prescribed for children for selected conditions, precautions do apply. Overdosage: If you think you have taken too much of this medicine contact a poison control center or emergency room at once. NOTE: This medicine is only for you. Do not share this medicine with others. What if I miss a dose? If you miss a dose, take it as soon as you can. If it is almost time for your next dose, take only that dose. Do not take double or extra doses. What may interact with this medicine? Do not take this medicine with any of the following medications:  alcohol or any product that contains alcohol  cisapride  disulfiram  dronedarone  pimozide  thioridazine This medicine may also interact with the following medications:  birth control pills  busulfan  carbamazepine  certain medicines that treat or prevent blood clots like warfarin  cimetidine  lithium  other medicines that prolong  the QT interval (cause an abnormal heart rhythm)  phenobarbital  phenytoin This list may not describe all possible interactions. Give your health care provider a list of all the medicines, herbs, non-prescription drugs, or dietary supplements you use. Also tell them if you smoke, drink alcohol, or use illegal drugs. Some items may interact with your medicine. What should I watch for while using this medicine? Tell your health care provider if your symptoms do not start to get better or if they get worse. Some products may contain alcohol. Ask your health care provider if this medicine contains alcohol. Be sure to tell all health care providers you are taking this medicine. Certain medicines, such as metronidazole and disulfiram, can cause an unpleasant reaction when taken with alcohol. The reaction includes flushing, headache, nausea, vomiting, sweating, and increased thirst. The reaction can last from 30 minutes to several hours. If you are being treated for a sexually transmitted disease (STD), avoid sexual contact until you have finished your treatment. Your sexual partner may  also need treatment. Birth control may not work properly while you are taking this medicine. Talk to your health care provider about using an extra method of birth control. What side effects may I notice from receiving this medicine? Side effects that you should report to your doctor or health care professional as soon as possible:  allergic reactions like skin rash, itching or hives, swelling of the face, lips, or tongue  confusion  fast, irregular heartbeat  light-colored stool  liver injury (dark yellow or brown urine; general ill feeling or flu-like symptoms; loss of appetite, right upper belly pain; unusually weak or tired, yellowing of the eyes or skin)  pain, tingling, numbness in the hands or feet  rash, fever, and swollen lymph nodes  redness, blistering, peeling or loosening of the skin, including  inside the mouth  seizures  unusual vaginal discharge, itching, or odor Side effects that usually do not require medical attention (report to your doctor or health care professional if they continue or are bothersome):  change in taste  diarrhea  headache  nausea  stomach pain  vomiting This list may not describe all possible side effects. Call your doctor for medical advice about side effects. You may report side effects to FDA at 1-800-FDA-1088. Where should I keep my medicine? Keep out of the reach of children and pets. Store between 15 and 25 degrees C (59 and 77 degrees F). Protect from light. Get rid of any unused medicine after the expiration date. To get rid of medicines that are no longer needed or have expired:  Take the medicine to a medicine take-back program. Check with your pharmacy or law enforcement to find a location.  If you cannot return the medicine, check the label or package insert to see if the medicine should be thrown out in the garbage or flushed down the toilet. If you are not sure, ask your health care provider. If it is safe to put it in the trash, take the medicine out of the container. Mix the medicine with cat litter, dirt, coffee grounds, or other unwanted substance. Seal the mixture in a bag or container. Put it in the trash. NOTE: This sheet is a summary. It may not cover all possible information. If you have questions about this medicine, talk to your doctor, pharmacist, or health care provider.  2021 Elsevier/Gold Standard (2019-11-21 09:24:11)    Common Medications Safe in Pregnancy  Acne:      Constipation:  Benzoyl Peroxide     Colace  Clindamycin      Dulcolax Suppository  Topica Erythromycin     Fibercon  Salicylic Acid      Metamucil         Miralax AVOID:        Senakot   Accutane    Cough:  Retin-A       Cough Drops  Tetracycline      Phenergan w/ Codeine if Rx  Minocycline      Robitussin (Plain &  DM)  Antibiotics:     Crabs/Lice:  Ceclor       RID  Cephalosporins    AVOID:  E-Mycins      Kwell  Keflex  Macrobid/Macrodantin   Diarrhea:  Penicillin      Kao-Pectate  Zithromax      Imodium AD         PUSH FLUIDS AVOID:       Cipro     Fever:  Tetracycline  Tylenol (Regular or Extra  Minocycline       Strength)  Levaquin      Extra Strength-Do not          Exceed 8 tabs/24 hrs Caffeine:        <238m/day (equiv. To 1 cup of coffee or  approx. 3 12 oz sodas)         Gas: Cold/Hayfever:       Gas-X  Benadryl      Mylicon  Claritin       Phazyme  **Claritin-D        Chlor-Trimeton    Headaches:  Dimetapp      ASA-Free Excedrin  Drixoral-Non-Drowsy     Cold Compress  Mucinex (Guaifenasin)     Tylenol (Regular or Extra  Sudafed/Sudafed-12 Hour     Strength)  **Sudafed PE Pseudoephedrine   Tylenol Cold & Sinus     Vicks Vapor Rub  Zyrtec  **AVOID if Problems With Blood Pressure         Heartburn: Avoid lying down for at least 1 hour after meals  Aciphex      Maalox     Rash:  Milk of Magnesia     Benadryl    Mylanta       1% Hydrocortisone Cream  Pepcid  Pepcid Complete   Sleep Aids:  Prevacid      Ambien   Prilosec       Benadryl  Rolaids       Chamomile Tea  Tums (Limit 4/day)     Unisom         Tylenol PM         Warm milk-add vanilla or  Hemorrhoids:       Sugar for taste  Anusol/Anusol H.C.  (RX: Analapram 2.5%)  Sugar Substitutes:  Hydrocortisone OTC     Ok in moderation  Preparation H      Tucks        Vaseline lotion applied to tissue with wiping    Herpes:     Throat:  Acyclovir      Oragel  Famvir  Valtrex     Vaccines:         Flu Shot Leg Cramps:       *Gardasil  Benadryl      Hepatitis A         Hepatitis B Nasal Spray:       Pneumovax  Saline Nasal Spray     Polio Booster         Tetanus Nausea:       Tuberculosis test or PPD  Vitamin B6 25 mg TID   AVOID:    Dramamine      *Gardasil  Emetrol       Live Poliovirus  Ginger  Root 250 mg QID    MMR (measles, mumps &  High Complex Carbs @ Bedtime    rebella)  Sea Bands-Accupressure    Varicella (Chickenpox)  Unisom 1/2 tab TID     *No known complications           If received before Pain:         Known pregnancy;   Darvocet       Resume series after  Lortab        Delivery  Percocet    Yeast:   Tramadol      Femstat  Tylenol 3      Gyne-lotrimin  Ultram       Monistat  Vicodin           MISC:         All Sunscreens           Hair Coloring/highlights          Insect Repellant's          (Including DEET)         Mystic Tans    Second Trimester of Pregnancy  The second trimester of pregnancy is from week 13 through week 27. This is also called months 4 through 6 of pregnancy. This is often the time when you feel your best. During the second trimester:  Morning sickness is less or has stopped.  You may have more energy.  You may feel hungry more often. At this time, your unborn baby (fetus) is growing very fast. At the end of the sixth month, the unborn baby may be up to 12 inches long and weigh about 1 pounds. You will likely start to feel the baby move between 16 and 20 weeks of pregnancy. Body changes during your second trimester Your body continues to go through many changes during this time. The changes vary and generally return to normal after the baby is born. Physical changes  You will gain more weight.  You may start to get stretch marks on your hips, belly (abdomen), and breasts.  Your breasts will grow and may hurt.  Dark spots or blotches may develop on your face.  A dark line from your belly button to the pubic area (linea nigra) may appear.  You may have changes in your hair. Health changes  You may have headaches.  You may have heartburn.  You may have trouble pooping (constipation).  You may have hemorrhoids or swollen, bulging veins (varicose veins).  Your gums may bleed.  You may pee (urinate) more often.  You  may have back pain. Follow these instructions at home: Medicines  Take over-the-counter and prescription medicines only as told by your doctor. Some medicines are not safe during pregnancy.  Take a prenatal vitamin that contains at least 600 micrograms (mcg) of folic acid. Eating and drinking  Eat healthy meals that include: ? Fresh fruits and vegetables. ? Whole grains. ? Good sources of protein, such as meat, eggs, or tofu. ? Low-fat dairy products.  Avoid raw meat and unpasteurized juice, milk, and cheese.  You may need to take these actions to prevent or treat trouble pooping: ? Drink enough fluids to keep your pee (urine) pale yellow. ? Eat foods that are high in fiber. These include beans, whole grains, and fresh fruits and vegetables. ? Limit foods that are high in fat and sugar. These include fried or sweet foods. Activity  Exercise only as told by your doctor. Most people can do their usual exercise during pregnancy. Try to exercise for 30 minutes at least 5 days a week.  Stop exercising if you have pain or cramps in your belly or lower back.  Do not exercise if it is too hot or too humid, or if you are in a place of great height (high altitude).  Avoid heavy lifting.  If you choose to, you may have sex unless your doctor tells you not to. Relieving pain and discomfort  Wear a good support bra if your breasts are sore.  Take warm water baths (sitz baths) to soothe pain or discomfort caused by hemorrhoids. Use hemorrhoid cream if your doctor approves.  Rest with your legs raised (elevated) if you  have leg cramps or low back pain.  If you develop bulging veins in your legs: ? Wear support hose as told by your doctor. ? Raise your feet for 15 minutes, 3-4 times a day. ? Limit salt in your food. Safety  Wear your seat belt at all times when you are in a car.  Talk with your doctor if someone is hurting you or yelling at you a lot. Lifestyle  Do not use hot  tubs, steam rooms, or saunas.  Do not douche. Do not use tampons or scented sanitary pads.  Avoid cat litter boxes and soil used by cats. These carry germs that can harm your baby and can cause a loss of your baby by miscarriage or stillbirth.  Do not use herbal medicines, illegal drugs, or medicines that are not approved by your doctor. Do not drink alcohol.  Do not smoke or use any products that contain nicotine or tobacco. If you need help quitting, ask your doctor. General instructions  Keep all follow-up visits. This is important.  Ask your doctor about local prenatal classes.  Ask your doctor about the right foods to eat or for help finding a counselor. Where to find more information  American Pregnancy Association: americanpregnancy.org  American College of Obstetricians and Gynecologists: www.acog.org  Office on Women's Health: womenshealth.gov/pregnancy Contact a doctor if:  You have a headache that does not go away when you take medicine.  You have changes in how you see, or you see spots in front of your eyes.  You have mild cramps, pressure, or pain in your lower belly.  You continue to feel like you may vomit (nauseous), you vomit, or you have watery poop (diarrhea).  You have bad-smelling fluid coming from your vagina.  You have pain when you pee or your pee smells bad.  You have very bad swelling of your face, hands, ankles, feet, or legs.  You have a fever. Get help right away if:  You are leaking fluid from your vagina.  You have spotting or bleeding from your vagina.  You have very bad belly cramping or pain.  You have trouble breathing.  You have chest pain.  You faint.  You have not felt your baby move for the time period told by your doctor.  You have new or increased pain, swelling, or redness in an arm or leg. Summary  The second trimester of pregnancy is from week 13 through week 27 (months 4 through 6).  Eat healthy  meals.  Exercise as told by your doctor. Most people can do their usual exercise during pregnancy.  Do not use herbal medicines, illegal drugs, or medicines that are not approved by your doctor. Do not drink alcohol.  Call your doctor if you get sick or if you notice anything unusual about your pregnancy. This information is not intended to replace advice given to you by your health care provider. Make sure you discuss any questions you have with your health care provider. Document Revised: 01/24/2020 Document Reviewed: 11/30/2019 Elsevier Patient Education  2021 Elsevier Inc.  

## 2020-11-14 NOTE — Progress Notes (Signed)
I have seen, interviewed, and examined the patient in conjunction with the [facility] [employer] and affirm the diagnosis and management plan.   Gunnar Bulla, CNM [facility], [facility] 11/14/20 5:10 PM

## 2020-11-15 ENCOUNTER — Ambulatory Visit
Admission: RE | Admit: 2020-11-15 | Discharge: 2020-11-15 | Disposition: A | Discharge status: Home / Self Care | Payer: Medicaid Other | Source: Ambulatory Visit | Attending: Certified Nurse Midwife | Admitting: Certified Nurse Midwife

## 2020-11-15 ENCOUNTER — Ambulatory Visit: Payer: Medicaid Other

## 2020-11-15 DIAGNOSIS — Z3A22 22 weeks gestation of pregnancy: Secondary | ICD-10-CM | POA: Diagnosis not present

## 2020-11-15 DIAGNOSIS — O321XX0 Maternal care for breech presentation, not applicable or unspecified: Secondary | ICD-10-CM | POA: Diagnosis not present

## 2020-11-15 DIAGNOSIS — Z3689 Encounter for other specified antenatal screening: Secondary | ICD-10-CM | POA: Diagnosis not present

## 2020-11-15 DIAGNOSIS — Z3482 Encounter for supervision of other normal pregnancy, second trimester: Secondary | ICD-10-CM | POA: Insufficient documentation

## 2020-11-15 DIAGNOSIS — Z3A21 21 weeks gestation of pregnancy: Secondary | ICD-10-CM

## 2020-11-18 ENCOUNTER — Other Ambulatory Visit: Payer: Self-pay | Admitting: Certified Nurse Midwife

## 2020-11-18 ENCOUNTER — Ambulatory Visit: Payer: Self-pay

## 2020-11-18 DIAGNOSIS — B3731 Acute candidiasis of vulva and vagina: Secondary | ICD-10-CM

## 2020-11-18 DIAGNOSIS — B373 Candidiasis of vulva and vagina: Secondary | ICD-10-CM

## 2020-11-18 LAB — CERVICOVAGINAL ANCILLARY ONLY
Bacterial Vaginitis (gardnerella): POSITIVE — AB
Candida Glabrata: NEGATIVE
Candida Vaginitis: POSITIVE — AB
Chlamydia: NEGATIVE
Comment: NEGATIVE
Comment: NEGATIVE
Comment: NEGATIVE
Comment: NEGATIVE
Comment: NEGATIVE
Comment: NORMAL
Neisseria Gonorrhea: NEGATIVE
Trichomonas: NEGATIVE

## 2020-11-18 MED ORDER — TERCONAZOLE 0.4 % VA CREA: 1.0000 | TOPICAL_CREAM | Freq: Every day | VAGINAL | 0 refills | Status: DC

## 2020-11-18 NOTE — Progress Notes (Signed)
Rx Terazol, see orders.    Serafina Royals, CNM [facility], [facility] 11/18/20 3:46 PM

## 2020-12-11 ENCOUNTER — Other Ambulatory Visit: Payer: Self-pay

## 2020-12-11 ENCOUNTER — Encounter: Payer: Self-pay | Admitting: Certified Nurse Midwife

## 2020-12-11 ENCOUNTER — Ambulatory Visit (INDEPENDENT_AMBULATORY_CARE_PROVIDER_SITE_OTHER): Payer: Medicaid Other | Admitting: Certified Nurse Midwife

## 2020-12-11 VITALS — BP 101/67 | HR 77

## 2020-12-11 DIAGNOSIS — Z3402 Encounter for supervision of normal first pregnancy, second trimester: Secondary | ICD-10-CM

## 2020-12-11 DIAGNOSIS — Z3A26 26 weeks gestation of pregnancy: Secondary | ICD-10-CM

## 2020-12-11 LAB — POCT URINALYSIS DIPSTICK OB
Bilirubin, UA: NEGATIVE
Blood, UA: NEGATIVE
Glucose, UA: NEGATIVE
Ketones, UA: NEGATIVE
Nitrite, UA: NEGATIVE
Spec Grav, UA: 1.02 (ref 1.010–1.025)
Urobilinogen, UA: 0.2 E.U./dL
pH, UA: 7 (ref 5.0–8.0)

## 2020-12-11 NOTE — Patient Instructions (Signed)

## 2020-12-11 NOTE — Progress Notes (Signed)
ROB doing well, feels good movement. Pt has allergies and is congested. Discussed self help measure. Encouraged use of antihistamine daily, pusedophed for congestion, po hydration, tylenol for headache /sinus pain. Use of saline mist for her nose. Talked about going into room with steaming shower to /use of humidifier. She verbalizes and agrees to plan. Reviewed 28 week labs next visit. She verbalize understanding follow up 2 wk with Marcelino Duster.   Doreene Burke, CNM

## 2020-12-11 NOTE — Progress Notes (Signed)
ROB: Her sinuses are bothering her and none of the safe medication for pregnancy are working for her. Otherwise she feels good.

## 2020-12-12 ENCOUNTER — Encounter: Payer: Medicaid Other | Admitting: Certified Nurse Midwife

## 2020-12-26 ENCOUNTER — Telehealth: Payer: Self-pay | Admitting: Certified Nurse Midwife

## 2020-12-26 NOTE — Telephone Encounter (Signed)
VM full. Will try later.

## 2020-12-26 NOTE — Telephone Encounter (Signed)
OB 28 5/7  Pt states that for the last 6 days she has a soreness in her lower abd below her belly button. When she moves its a sharp pain. She is also having lower back pain. Pt is at work today.   + 8 bottles of water a day + 3 meals a day  NO vb- lite spotting this pregnancy No uti sx +FM BM- wnl  NO med tried.   Encouraged pt to try tylenol ES and a belly band. If no better or she starts bleeding like a period she needs to contact the office or go to the ED. Pt voiced understanding,.

## 2020-12-26 NOTE — Telephone Encounter (Signed)
Please advise 

## 2020-12-26 NOTE — Telephone Encounter (Signed)
Natasha Sloan called in and states that since Friday she has been experiencing pain in her lower back and stomach under her belly button.  Natasha Sloan states her stomach feels tight.  Natasha Sloan states she didn't really worry too much because she still feels the baby move, however, she is 29 weeks and states she has trouble getting up out of bed due to the pain.  Natasha Sloan would like a call back to see if this is normal or if she should be seen.  Please advise.

## 2020-12-30 ENCOUNTER — Other Ambulatory Visit: Payer: Medicaid Other

## 2020-12-30 ENCOUNTER — Encounter: Payer: Self-pay | Admitting: Certified Nurse Midwife

## 2020-12-30 ENCOUNTER — Other Ambulatory Visit (HOSPITAL_COMMUNITY)
Admission: RE | Admit: 2020-12-30 | Discharge: 2020-12-30 | Disposition: A | Discharge status: Home / Self Care | Payer: Medicaid Other | Source: Ambulatory Visit | Attending: Certified Nurse Midwife | Admitting: Certified Nurse Midwife

## 2020-12-30 ENCOUNTER — Ambulatory Visit (INDEPENDENT_AMBULATORY_CARE_PROVIDER_SITE_OTHER): Payer: Medicaid Other | Admitting: Certified Nurse Midwife

## 2020-12-30 ENCOUNTER — Other Ambulatory Visit: Payer: Self-pay

## 2020-12-30 VITALS — BP 94/64 | HR 102 | Wt 146.8 lb

## 2020-12-30 DIAGNOSIS — Z3483 Encounter for supervision of other normal pregnancy, third trimester: Secondary | ICD-10-CM

## 2020-12-30 DIAGNOSIS — Z23 Encounter for immunization: Secondary | ICD-10-CM | POA: Diagnosis not present

## 2020-12-30 DIAGNOSIS — O26853 Spotting complicating pregnancy, third trimester: Secondary | ICD-10-CM | POA: Insufficient documentation

## 2020-12-30 DIAGNOSIS — Z3A29 29 weeks gestation of pregnancy: Secondary | ICD-10-CM | POA: Diagnosis not present

## 2020-12-30 DIAGNOSIS — Z113 Encounter for screening for infections with a predominantly sexual mode of transmission: Secondary | ICD-10-CM

## 2020-12-30 DIAGNOSIS — R310 Gross hematuria: Secondary | ICD-10-CM

## 2020-12-30 DIAGNOSIS — Z3403 Encounter for supervision of normal first pregnancy, third trimester: Secondary | ICD-10-CM

## 2020-12-30 LAB — POCT URINALYSIS DIPSTICK OB
Blood, UA: POSITIVE
Glucose, UA: NEGATIVE
Ketones, UA: NEGATIVE
Nitrite, UA: NEGATIVE
Spec Grav, UA: 1.025 (ref 1.010–1.025)
Urobilinogen, UA: 1 E.U./dL
pH, UA: 6.5 (ref 5.0–8.0)

## 2020-12-30 NOTE — Patient Instructions (Signed)
Rosen's Emergency Medicine: Concepts and Clinical Practice (9th ed., pp. 2296- 2312). Elsevier.">  Braxton Hicks Contractions Contractions of the uterus can occur throughout pregnancy, but they are not always a sign that you are in labor. You may have practice contractions called Braxton Hicks contractions. These false labor contractions are sometimes confused with true labor. What are Deberah Pelton contractions? Braxton Hicks contractions are tightening movements that occur in the muscles of the uterus before labor. Unlike true labor contractions, these contractions do not result in opening (dilation) and thinning of the cervix. Toward the end of pregnancy (32-34 weeks), Braxton Hicks contractions can happen more often and may become stronger. These contractions are sometimes difficult to tell apart from true labor because they can be very uncomfortable. You should not feel embarrassed if you go to the hospital with false labor. Sometimes, the only way to tell if you are in true labor is for your health care provider to look for changes in the cervix. The health care provider will do a physical exam and may monitor your contractions. If you are not in true labor, the exam should show that your cervix is not dilating and your water has not broken. If there are no other health problems associated with your pregnancy, it is completely safe for you to be sent home with false labor. You may continue to have Braxton Hicks contractions until you go into true labor. How to tell the difference between true labor and false labor True labor  Contractions last 30-70 seconds.  Contractions become very regular.  Discomfort is usually felt in the top of the uterus, and it spreads to the lower abdomen and low back.  Contractions do not go away with walking.  Contractions usually become more intense and increase in frequency.  The cervix dilates and gets thinner. False labor  Contractions are usually  shorter and not as strong as true labor contractions.  Contractions are usually irregular.  Contractions are often felt in the front of the lower abdomen and in the groin.  Contractions may go away when you walk around or change positions while lying down.  Contractions get weaker and are shorter-lasting as time goes on.  The cervix usually does not dilate or become thin. Follow these instructions at home:  Take over-the-counter and prescription medicines only as told by your health care provider.  Keep up with your usual exercises and follow other instructions from your health care provider.  Eat and drink lightly if you think you are going into labor.  If Braxton Hicks contractions are making you uncomfortable: ? Change your position from lying down or resting to walking, or change from walking to resting. ? Sit and rest in a tub of warm water. ? Drink enough fluid to keep your urine pale yellow. Dehydration may cause these contractions. ? Do slow and deep breathing several times an hour.  Keep all follow-up prenatal visits as told by your health care provider. This is important.   Contact a health care provider if:  You have a fever.  You have continuous pain in your abdomen. Get help right away if:  Your contractions become stronger, more regular, and closer together.  You have fluid leaking or gushing from your vagina.  You pass blood-tinged mucus (bloody show).  You have bleeding from your vagina.  You have low back pain that you never had before.  You feel your baby's head pushing down and causing pelvic pressure.  Your baby is not  moving inside you as much as it used to. Summary  Contractions that occur before labor are called Braxton Hicks contractions, false labor, or practice contractions.  Braxton Hicks contractions are usually shorter, weaker, farther apart, and less regular than true labor contractions. True labor contractions usually become  progressively stronger and regular, and they become more frequent.  Manage discomfort from [facility] contractions by changing position, resting in a warm bath, drinking plenty of water, or practicing deep breathing. This information is not intended to replace advice given to you by your health care provider. Make sure you discuss any questions you have with your health care provider. Document Revised: 07/30/2017 Document Reviewed: 12/31/2016 Elsevier Patient Education  2021 Elsevier Inc.   Round Ligament Pain  The round ligament is a cord of muscle and tissue that helps support the uterus. It can become a source of pain during pregnancy if it becomes stretched or twisted as the baby grows. The pain usually begins in the second trimester (13-28 weeks) of pregnancy, and it can come and go until the baby is delivered. It is not a serious problem, and it does not cause harm to the baby. Round ligament pain is usually a short, sharp, and pinching pain, but it can also be a dull, lingering, and aching pain. The pain is felt in the lower side of the abdomen or in the groin. It usually starts deep in the groin and moves up to the outside of the hip area. The pain may occur when you:  Suddenly change position, such as quickly going from a sitting to standing position.  Roll over in bed.  Cough or sneeze.  Do physical activity. Follow these instructions at home:  Watch your condition for any changes.  When the pain starts, relax. Then try any of these methods to help with the pain: ? Sitting down. ? Flexing your knees up to your abdomen. ? Lying on your side with one pillow under your abdomen and another pillow between your legs. ? Sitting in a warm bath for 15-20 minutes or until the pain goes away.  Take over-the-counter and prescription medicines only as told by your health care provider.  Move slowly when you sit down or stand up.  Avoid long walks if they cause pain.  Stop or  reduce your physical activities if they cause pain.  Keep all follow-up visits as told by your health care provider. This is important.   Contact a health care provider if:  Your pain does not go away with treatment.  You feel pain in your back that you did not have before.  Your medicine is not helping. Get help right away if:  You have a fever or chills.  You develop uterine contractions.  You have vaginal bleeding.  You have nausea or vomiting.  You have diarrhea.  You have pain when you urinate. Summary  Round ligament pain is felt in the lower abdomen or groin. It is usually a short, sharp, and pinching pain. It can also be a dull, lingering, and aching pain.  This pain usually begins in the second trimester (13-28 weeks). It occurs because the uterus is stretching with the growing baby, and it is not harmful to the baby.  You may notice the pain when you suddenly change position, when you cough or sneeze, or during physical activity.  Relaxing, flexing your knees to your abdomen, lying on one side, or taking a warm bath may help to get rid of the  pain.  Get help from your health care provider if the pain does not go away or if you have vaginal bleeding, nausea, vomiting, diarrhea, or painful urination. This information is not intended to replace advice given to you by your health care provider. Make sure you discuss any questions you have with your health care provider. Document Revised: 02/02/2018 Document Reviewed: 02/02/2018 Elsevier Patient Education  2021 [employer]    Third Trimester of Pregnancy  The third trimester of pregnancy is from week 28 through week 40. This is also called months 7 through 9. This trimester is when your unborn baby (fetus) is growing very fast. At the end of the ninth month, the unborn baby is about 20 inches long. It weighs about 6-10 pounds. Body changes during your third trimester Your body continues to go through many changes  during this time. The changes vary and generally return to normal after the baby is born. Physical changes  Your weight will continue to increase. You may gain 25-35 pounds (11-16 kg) by the end of the pregnancy. If you are underweight, you may gain 28-40 lb (about 13-18 kg). If you are overweight, you may gain 15-25 lb (about 7-11 kg).  You may start to get stretch marks on your hips, belly (abdomen), and breasts.  Your breasts will continue to grow and may hurt. A yellow fluid (colostrum) may leak from your breasts. This is the first milk you are making for your baby.  You may have changes in your hair.  Your belly button may stick out.  You may have more swelling in your hands, face, or ankles. Health changes  You may have heartburn.  You may have trouble pooping (constipation).  You may get hemorrhoids. These are swollen veins in the butt that can itch or get painful.  You may have swollen veins (varicose veins) in your legs.  You may have more body aches in the pelvis, back, or thighs.  You may have more tingling or numbness in your hands, arms, and legs. The skin on your belly may also feel numb.  You may feel short of breath as your womb (uterus) gets bigger. Other changes  You may pee (urinate) more often.  You may have more problems sleeping.  You may notice the unborn baby "dropping," or moving lower in your belly.  You may have more discharge coming from your vagina.  Your joints may feel loose, and you may have pain around your pelvic bone. Follow these instructions at home: Medicines  Take over-the-counter and prescription medicines only as told by your doctor. Some medicines are not safe during pregnancy.  Take a prenatal vitamin that contains at least 600 micrograms (mcg) of folic acid. Eating and drinking  Eat healthy meals that include: ? Fresh fruits and vegetables. ? Whole grains. ? Good sources of protein, such as meat, eggs, or tofu. ? Low-fat  dairy products.  Avoid raw meat and unpasteurized juice, milk, and cheese. These carry germs that can harm you and your baby.  Eat 4 or 5 small meals rather than 3 large meals a day.  You may need to take these actions to prevent or treat trouble pooping: ? Drink enough fluids to keep your pee (urine) pale yellow. ? Eat foods that are high in fiber. These include beans, whole grains, and fresh fruits and vegetables. ? Limit foods that are high in fat and sugar. These include fried or sweet foods. Activity  Exercise only as told by your  doctor. Stop exercising if you start to have cramps in your womb.  Avoid heavy lifting.  Do not exercise if it is too hot or too humid, or if you are in a place of great height (high altitude).  If you choose to, you may have sex unless your doctor tells you not to. Relieving pain and discomfort  Take breaks often, and rest with your legs raised (elevated) if you have leg cramps or low back pain.  Take warm water baths (sitz baths) to soothe pain or discomfort caused by hemorrhoids. Use hemorrhoid cream if your doctor approves.  Wear a good support bra if your breasts are tender.  If you develop bulging, swollen veins in your legs: ? Wear support hose as told by your doctor. ? Raise your feet for 15 minutes, 3-4 times a day. ? Limit salt in your food. Safety  Talk to your doctor before traveling far distances.  Do not use hot tubs, steam rooms, or saunas.  Wear your seat belt at all times when you are in a car.  Talk with your doctor if someone is hurting you or yelling at you a lot. Preparing for your baby's arrival To prepare for the arrival of your baby:  Take prenatal classes.  Visit the hospital and tour the maternity area.  Buy a rear-facing car seat. Learn how to install it in your car.  Prepare the baby's room. Take out all pillows and stuffed animals from the baby's crib. General instructions  Avoid cat litter boxes and soil  used by cats. These carry germs that can cause harm to the baby and can cause a loss of your baby by miscarriage or stillbirth.  Do not douche or use tampons. Do not use scented sanitary pads.  Do not smoke or use any products that contain nicotine or tobacco. If you need help quitting, ask your doctor.  Do not drink alcohol.  Do not use herbal medicines, illegal drugs, or medicines that were not approved by your doctor. Chemicals in these products can affect your baby.  Keep all follow-up visits. This is important. Where to find more information  American Pregnancy Association: americanpregnancy.org  [employer]: www.acog.org  Office on Women's Health: MightyReward.co.nz Contact a doctor if:  You have a fever.  You have mild cramps or pressure in your lower belly.  You have a nagging pain in your belly area.  You vomit, or you have watery poop (diarrhea).  You have bad-smelling fluid coming from your vagina.  You have pain when you pee, or your pee smells bad.  You have a headache that does not go away when you take medicine.  You have changes in how you see, or you see spots in front of your eyes. Get help right away if:  Your water breaks.  You have regular contractions that are less than 5 minutes apart.  You are spotting or bleeding from your vagina.  You have very bad belly cramps or pain.  You have trouble breathing.  You have chest pain.  You faint.  You have not felt the baby move for the amount of time told by your doctor.  You have new or increased pain, swelling, or redness in an arm or leg. Summary  The third trimester is from week 28 through week 40 (months 7 through 9). This is the time when your unborn baby is growing very fast.  During this time, your discomfort may increase as you gain  weight and as your baby grows.  Get ready for your baby to arrive by taking prenatal classes, buying a  rear-facing car seat, and preparing the baby's room.  Get help right away if you are bleeding from your vagina, you have chest pain and trouble breathing, or you have not felt the baby move for the amount of time told by your doctor. This information is not intended to replace advice given to you by your health care provider. Make sure you discuss any questions you have with your health care provider. Document Revised: 01/24/2020 Document Reviewed: 11/30/2019 Elsevier Patient Education  2021 [employer].

## 2020-12-30 NOTE — Progress Notes (Signed)
ROB- Reports spotting for the last three (3) days, changing panty liner twice daily. Urine sent for culture; see orders. Vaginal swab collected by CNM and cervix visually closed, see orders.  28 week labs collected today, see orders. TDAP given and blood transfusion consent signed, see chart. Breastfeeding education and pregnancy overview/plan  reviewed. Third trimester handouts provided. Encouraged home treatment measures and use of abdominal support, patient verbalized understanding. Anticipatory guidance regarding course of prenatal care. Reviewed red flag symptoms and when to call. RTC x 2 weeks for ROB with ANNIE; RTC x 4 weeks for ROB with JML or sooner if needed.  Juliann Pares, Student-[employer]ife [employer] 12/30/20 9:50 AM

## 2020-12-30 NOTE — Progress Notes (Signed)
ROB: She had some pressure over the weekend. Doing well today. No new concerns.

## 2020-12-30 NOTE — Progress Notes (Signed)
I have seen, interviewed, and examined the patient in conjunction with the [employer] and affirm the diagnosis and management plan.   Gunnar Bulla, CNM [facility], [facility] 12/30/20 10:54 AM

## 2020-12-31 LAB — CBC
Hematocrit: 33.9 % — ABNORMAL LOW (ref 34.0–46.6)
Hemoglobin: 11.4 g/dL (ref 11.1–15.9)
MCH: 29.7 pg (ref 26.6–33.0)
MCHC: 33.6 g/dL (ref 31.5–35.7)
MCV: 88 fL (ref 79–97)
Platelets: 226 10*3/uL (ref 150–450)
RBC: 3.84 x10E6/uL (ref 3.77–5.28)
RDW: 13.1 % (ref 11.7–15.4)
WBC: 6.3 10*3/uL (ref 3.4–10.8)

## 2020-12-31 LAB — RPR: RPR Ser Ql: NONREACTIVE

## 2020-12-31 LAB — CERVICOVAGINAL ANCILLARY ONLY
Bacterial Vaginitis (gardnerella): POSITIVE — AB
Candida Glabrata: NEGATIVE
Candida Vaginitis: POSITIVE — AB
Chlamydia: NEGATIVE
Comment: NEGATIVE
Comment: NEGATIVE
Comment: NEGATIVE
Comment: NEGATIVE
Comment: NEGATIVE
Comment: NORMAL
Neisseria Gonorrhea: NEGATIVE
Trichomonas: NEGATIVE

## 2020-12-31 LAB — GLUCOSE, 1 HOUR GESTATIONAL: Gestational Diabetes Screen: 78 mg/dL (ref 65–139)

## 2021-01-01 ENCOUNTER — Telehealth: Payer: Self-pay | Admitting: Certified Nurse Midwife

## 2021-01-01 NOTE — Telephone Encounter (Signed)
New Message:  Pt has been bleeding since last Thursday.  She is not soaking through a pad but it is heavier than spotting now.

## 2021-01-01 NOTE — Telephone Encounter (Signed)
Called patient back in regards to her bleeding. She recently had sex and

## 2021-01-02 ENCOUNTER — Other Ambulatory Visit: Payer: Self-pay | Admitting: Certified Nurse Midwife

## 2021-01-02 DIAGNOSIS — Z3A29 29 weeks gestation of pregnancy: Secondary | ICD-10-CM

## 2021-01-02 DIAGNOSIS — B373 Candidiasis of vulva and vagina: Secondary | ICD-10-CM

## 2021-01-02 DIAGNOSIS — B9689 Other specified bacterial agents as the cause of diseases classified elsewhere: Secondary | ICD-10-CM

## 2021-01-02 DIAGNOSIS — B3731 Acute candidiasis of vulva and vagina: Secondary | ICD-10-CM

## 2021-01-02 DIAGNOSIS — N760 Acute vaginitis: Secondary | ICD-10-CM

## 2021-01-02 DIAGNOSIS — Z3483 Encounter for supervision of other normal pregnancy, third trimester: Secondary | ICD-10-CM

## 2021-01-02 LAB — URINE CULTURE

## 2021-01-02 MED ORDER — TERCONAZOLE 0.4 % VA CREA: 1.0000 | TOPICAL_CREAM | Freq: Every day | VAGINAL | 0 refills | Status: DC

## 2021-01-02 MED ORDER — METRONIDAZOLE 500 MG PO TABS: 500.0000 mg | ORAL_TABLET | Freq: Two times a day (BID) | ORAL | 0 refills | Status: AC

## 2021-01-02 NOTE — Progress Notes (Signed)
Rx Flagyl and Terazol, see orders.    Natasha Sloan, CNM [facility], [facility] 01/02/21 4:06 PM

## 2021-01-03 ENCOUNTER — Encounter: Payer: Self-pay | Admitting: Certified Nurse Midwife

## 2021-01-03 DIAGNOSIS — R8271 Bacteriuria: Secondary | ICD-10-CM

## 2021-01-03 HISTORY — DX: Bacteriuria: R82.71

## 2021-01-13 ENCOUNTER — Other Ambulatory Visit: Payer: Self-pay

## 2021-01-13 ENCOUNTER — Encounter: Payer: Self-pay | Admitting: Certified Nurse Midwife

## 2021-01-13 ENCOUNTER — Ambulatory Visit (INDEPENDENT_AMBULATORY_CARE_PROVIDER_SITE_OTHER): Payer: Medicaid Other | Admitting: Certified Nurse Midwife

## 2021-01-13 VITALS — BP 97/60 | HR 99 | Wt 145.3 lb

## 2021-01-13 DIAGNOSIS — Z3483 Encounter for supervision of other normal pregnancy, third trimester: Secondary | ICD-10-CM

## 2021-01-13 DIAGNOSIS — Z3A31 31 weeks gestation of pregnancy: Secondary | ICD-10-CM

## 2021-01-13 LAB — POCT URINALYSIS DIPSTICK OB
Bilirubin, UA: NEGATIVE
Blood, UA: NEGATIVE
Glucose, UA: NEGATIVE
Ketones, UA: NEGATIVE
Nitrite, UA: NEGATIVE
Odor: NEGATIVE
Spec Grav, UA: 1.015 (ref 1.010–1.025)
Urobilinogen, UA: 0.2 E.U./dL
pH, UA: 6.5 (ref 5.0–8.0)

## 2021-01-13 NOTE — Progress Notes (Signed)
ROB doing well. Feels good fetal movement. Reviewed urine results and need for antibiotics in labor. She verbalizes and agrees. PTL precautions reviewed. Follow up 2 wk with Marcelino Duster for ROB.   Doreene Burke, CNM

## 2021-01-13 NOTE — Progress Notes (Signed)
ROB- no  Complaints.

## 2021-01-13 NOTE — Patient Instructions (Signed)
Preterm Labor Pregnancy normally lasts 39-41 weeks. Preterm labor is when labor starts before you have been pregnant for 37 weeks. Babies who are born too early may have problems with blood sugar, body temperature, heart, and breathing. These problems may be very serious in babies who are born before 34 weeks of pregnancy. What are the causes? The cause of this condition is not known. What increases the risk? You are more likely to have preterm labor if:  You have medical problems, now or in the past.  You have problems now or in your past pregnancies.  You have lifestyle problems. Medical history  You have problems of the womb (uterus).  You have an infection, including infections you get from sex.  You have problems that do not go away, such as: ? Blood clots. ? High blood pressure. ? High blood sugar.  You have low body weight or too much body weight. Present and past pregnancies  You have had preterm labor before.  You are pregnant with two babies or more.  You have a condition in which the placenta covers your cervix.  You waited less than 6 months between giving birth and becoming pregnant again.  Your unborn baby has some problems.  You have bleeding from your vagina.  You became pregnant by a method called IVF. Lifestyle  You smoke.  You drink alcohol.  You use drugs.  You have stress.  You have abuse in your home.  You come in contact with chemicals that harm the body (pollutants). Other factors  You are younger than 17 years or older than 35 years. What are the signs or symptoms? Symptoms of this condition include:  Cramps. The cramps may feel like cramps from a period.  You may have watery poop (diarrhea).  Pain in the belly (abdomen).  Pain in the lower back.  Regular contractions. It may feel like your belly is getting tighter.  Pressure in the lower belly.  More fluid leaking from the vagina. The fluid may be watery or  bloody.  Water breaking. How is this treated? Treatment for this condition depends on your health, the health of your baby, and how old your pregnancy is. It may include:  Taking medicines, such as: ? Hormone medicines. ? Medicines to stop contractions. ? Medicines to help mature the baby's lungs. ? Medicines to prevent your baby from getting cerebral palsy.  Bed rest. If the labor happens before 34 weeks of pregnancy, you may need to stay in the hospital.  Delivering the baby. Follow these instructions at home:  Do not use any products that contain nicotine or tobacco, such as cigarettes, e-cigarettes, and chewing tobacco. If you need help quitting, ask your doctor.  Do not drink alcohol.  Take over-the-counter and prescription medicines only as told by your doctor.  Rest as told by your doctor.  Return to your activities as told by your doctor. Ask your doctor what activities are safe for you.  Keep all follow-up visits as told by your doctor. This is important.   How is this prevented? To have a healthy pregnancy:  Do not use street drugs.  Do not use any medicines unless you ask your doctor if they are safe for you.  Talk with your doctor before taking any herbal supplements.  Make sure you gain enough weight.  Watch for infection. If you think you might have an infection, get it checked right away. Symptoms of infection may include: ? Fever. ? Vaginal discharge. ?   Pain or burning when you pee. ? Needing to pee urgently. ? Needing to pee often. ? Peeing small amounts often. ? Blood in your pee. ? Pee that smells bad or unusual.  Tell your doctor if you have gone into preterm labor before. Contact a doctor if:  You think you are going into preterm labor.  You have symptoms of preterm labor.  You have symptoms of infection. Get help right away if:  You are having painful contractions every 5 minutes or less.  Your water breaks. Summary  Preterm labor  is labor that starts before you reach 37 weeks of pregnancy.  Your baby may have problems if delivered early.  The cause of preterm labor is not known. Having problems of the womb (uterus), an infection, or bleeding during pregnancy increases the risk.  Contact a doctor if you have signs or symptoms of preterm labor. This information is not intended to replace advice given to you by your health care provider. Make sure you discuss any questions you have with your health care provider. Document Revised: 09/19/2019 Document Reviewed: 09/19/2019 Elsevier Patient Education  2021 Elsevier Inc.  

## 2021-01-16 ENCOUNTER — Telehealth: Payer: Self-pay | Admitting: Certified Nurse Midwife

## 2021-01-16 NOTE — Telephone Encounter (Signed)
Natasha Sloan called in and states she needs a dental form emailed ASAP to her dentist.  Patient stated it needs to be emailed and she requests to be cc'd in the email.   The dental form needs to be emailed to   [email or web address]  And Natasha Sloan's email address is  [email or web address]

## 2021-01-16 NOTE — Telephone Encounter (Signed)
Pt aware dental clearance sent via my chart.

## 2021-01-17 ENCOUNTER — Other Ambulatory Visit: Payer: Self-pay

## 2021-01-17 MED ORDER — PRENATE PIXIE 10-0.6-0.4-200 MG PO CAPS: 200.0000 mg | ORAL_CAPSULE | Freq: Every day | ORAL | 11 refills | Status: DC

## 2021-01-29 ENCOUNTER — Telehealth: Payer: Self-pay | Admitting: Certified Nurse Midwife

## 2021-01-29 NOTE — Telephone Encounter (Signed)
Natasha Sloan called in and stated that she was at the dentist today for a procedure and the Medical Assistant told her that her blood pressure was too low and had to reschedule the procedure.  Natasha Sloan states she was feeling dizzy and faint when the medical assistant offered to check her vitals.  Natasha Sloan states her systolic pressure was 107.  Natasha Sloan wanted to know if she needs to be seen.  Natasha Sloan is requesting to be seen today.  Please advise.

## 2021-01-29 NOTE — Telephone Encounter (Signed)
Called patient to assure her that her blood pressure is normal. Her blood pressure is usually in the lower range. She also wanted to know if she can be seen today instead of Friday, because she is off today. I told her that if we had any cancellation I would call her.

## 2021-01-30 ENCOUNTER — Encounter: Payer: Medicaid Other | Admitting: Certified Nurse Midwife

## 2021-01-31 ENCOUNTER — Ambulatory Visit (INDEPENDENT_AMBULATORY_CARE_PROVIDER_SITE_OTHER): Payer: Medicaid Other | Admitting: Certified Nurse Midwife

## 2021-01-31 ENCOUNTER — Other Ambulatory Visit: Payer: Self-pay

## 2021-01-31 ENCOUNTER — Encounter: Payer: Self-pay | Admitting: Certified Nurse Midwife

## 2021-01-31 VITALS — BP 99/65 | HR 98 | Wt 148.0 lb

## 2021-01-31 DIAGNOSIS — Z3481 Encounter for supervision of other normal pregnancy, first trimester: Secondary | ICD-10-CM

## 2021-01-31 DIAGNOSIS — Z3A33 33 weeks gestation of pregnancy: Secondary | ICD-10-CM

## 2021-01-31 DIAGNOSIS — Z3403 Encounter for supervision of normal first pregnancy, third trimester: Secondary | ICD-10-CM

## 2021-01-31 LAB — POCT URINALYSIS DIPSTICK OB
Bilirubin, UA: NEGATIVE
Blood, UA: NEGATIVE
Glucose, UA: NEGATIVE
Ketones, UA: NEGATIVE
Nitrite, UA: NEGATIVE
POC,PROTEIN,UA: NEGATIVE
Spec Grav, UA: 1.015 (ref 1.010–1.025)
Urobilinogen, UA: 0.2 E.U./dL
pH, UA: 6 (ref 5.0–8.0)

## 2021-01-31 NOTE — Patient Instructions (Signed)
Fetal Movement Counts Patient Name: ________________________________________________ Patient Due Date: ____________________  What is a fetal movement count? A fetal movement count is the number of times that you feel your baby move during a certain amount of time. This may also be called a fetal kick count. A fetal movement count is recommended for every pregnant woman. You may be asked to start counting fetal movements as early as week 28 of your pregnancy. Pay attention to when your baby is most active. You may notice your baby's sleep and wake cycles. You may also notice things that make your baby move more. You should do a fetal movement count:  When your baby is normally most active.  At the same time each day. A good time to count movements is while you are resting, after having something to eat and drink. How do I count fetal movements? 1. Find a quiet, comfortable area. Sit, or lie down on your side. 2. Write down the date, the start time and stop time, and the number of movements that you felt between those two times. Take this information with you to your health care visits. 3. Write down your start time when you feel the first movement. 4. Count kicks, flutters, swishes, rolls, and jabs. You should feel at least 10 movements. 5. You may stop counting after you have felt 10 movements, or if you have been counting for 2 hours. Write down the stop time. 6. If you do not feel 10 movements in 2 hours, contact your health care provider for further instructions. Your health care provider may want to do additional tests to assess your baby's well-being. Contact a health care provider if:  You feel fewer than 10 movements in 2 hours.  Your baby is not moving like he or she usually does. Date: ____________ Start time: ____________ Stop time: ____________ Movements: ____________ Date: ____________ Start time: ____________ Stop time: ____________ Movements: ____________ Date: ____________  Start time: ____________ Stop time: ____________ Movements: ____________ Date: ____________ Start time: ____________ Stop time: ____________ Movements: ____________ Date: ____________ Start time: ____________ Stop time: ____________ Movements: ____________ Date: ____________ Start time: ____________ Stop time: ____________ Movements: ____________ Date: ____________ Start time: ____________ Stop time: ____________ Movements: ____________ Date: ____________ Start time: ____________ Stop time: ____________ Movements: ____________ Date: ____________ Start time: ____________ Stop time: ____________ Movements: ____________ This information is not intended to replace advice given to you by your health care provider. Make sure you discuss any questions you have with your health care provider. Document Revised: 04/06/2019 Document Reviewed: 04/06/2019 Elsevier Patient Education  2021 Elsevier Inc.  

## 2021-01-31 NOTE — Progress Notes (Signed)
ROB-Doing well, no questions or concerns. Anticipatory guidance regarding course of prenatal care. Reviewed red flag symptoms and when to call. RTC x 2 weeks for ROB or sooner if needed.

## 2021-01-31 NOTE — Progress Notes (Signed)
ROB: She is doing well today, no concerns. 

## 2021-02-06 ENCOUNTER — Telehealth: Payer: Self-pay | Admitting: Certified Nurse Midwife

## 2021-02-06 NOTE — Telephone Encounter (Signed)
Called patient and informed her that she can take a max dose of 4,000 mg of Tylenol in a day. Per Leotis Shames.

## 2021-02-06 NOTE — Telephone Encounter (Signed)
Natasha Sloan called and stated she had her wisdom teeth taken out this morning and is in a lot of pain.  She wants to know what is the max dose a day of tylenol she can take or any other pain medication.  Please advise.

## 2021-02-12 ENCOUNTER — Ambulatory Visit (INDEPENDENT_AMBULATORY_CARE_PROVIDER_SITE_OTHER): Payer: Medicaid Other | Admitting: Certified Nurse Midwife

## 2021-02-12 ENCOUNTER — Other Ambulatory Visit: Payer: Self-pay

## 2021-02-12 VITALS — BP 100/67 | HR 91 | Wt 151.7 lb

## 2021-02-12 DIAGNOSIS — Z3481 Encounter for supervision of other normal pregnancy, first trimester: Secondary | ICD-10-CM

## 2021-02-12 DIAGNOSIS — Z3A36 36 weeks gestation of pregnancy: Secondary | ICD-10-CM

## 2021-02-12 LAB — POCT URINALYSIS DIPSTICK
Bilirubin, UA: NEGATIVE
Blood, UA: NEGATIVE
Glucose, UA: NEGATIVE
Ketones, UA: NEGATIVE
Leukocytes, UA: NEGATIVE
Nitrite, UA: NEGATIVE
Protein, UA: NEGATIVE
Spec Grav, UA: 1.02 (ref 1.010–1.025)
Urobilinogen, UA: 0.2 E.U./dL
pH, UA: 6 (ref 5.0–8.0)

## 2021-02-12 NOTE — Patient Instructions (Signed)

## 2021-02-12 NOTE — Addendum Note (Signed)
Addended by: Lady Deutscher on: 02/12/2021 04:19 PM   Modules accepted: Orders

## 2021-02-12 NOTE — Progress Notes (Addendum)
ROB doing well. Feels good movement. swab collected.  Discussed GBS positive in her urine and that she would be treated with antibiotics in labor.  She verbalizes and agree. All questions answered . Follow up 1 wk with Marcelino Duster.   Doreene Burke, CNM

## 2021-02-16 LAB — GC/CHLAMYDIA PROBE AMP
Chlamydia trachomatis, NAA: NEGATIVE
Neisseria Gonorrhoeae by PCR: NEGATIVE

## 2021-02-17 ENCOUNTER — Encounter: Payer: Medicaid Other | Admitting: Certified Nurse Midwife

## 2021-02-20 ENCOUNTER — Encounter: Payer: Self-pay | Admitting: Certified Nurse Midwife

## 2021-02-20 ENCOUNTER — Other Ambulatory Visit: Payer: Self-pay

## 2021-02-20 ENCOUNTER — Encounter: Payer: Medicaid Other | Admitting: Certified Nurse Midwife

## 2021-02-20 ENCOUNTER — Ambulatory Visit (INDEPENDENT_AMBULATORY_CARE_PROVIDER_SITE_OTHER): Payer: Medicaid Other | Admitting: Certified Nurse Midwife

## 2021-02-20 VITALS — BP 100/66 | HR 99 | Wt 150.4 lb

## 2021-02-20 DIAGNOSIS — Z3689 Encounter for other specified antenatal screening: Secondary | ICD-10-CM | POA: Diagnosis not present

## 2021-02-20 DIAGNOSIS — Z3A36 36 weeks gestation of pregnancy: Secondary | ICD-10-CM

## 2021-02-20 DIAGNOSIS — R8271 Bacteriuria: Secondary | ICD-10-CM

## 2021-02-20 DIAGNOSIS — O368130 Decreased fetal movements, third trimester, not applicable or unspecified: Secondary | ICD-10-CM | POA: Diagnosis not present

## 2021-02-20 DIAGNOSIS — O2243 Hemorrhoids in pregnancy, third trimester: Secondary | ICD-10-CM

## 2021-02-20 DIAGNOSIS — Z3483 Encounter for supervision of other normal pregnancy, third trimester: Secondary | ICD-10-CM

## 2021-02-20 LAB — POCT URINALYSIS DIPSTICK OB
Spec Grav, UA: 1.015 (ref 1.010–1.025)
pH, UA: 6.5 (ref 5.0–8.0)

## 2021-02-20 NOTE — Progress Notes (Signed)
ROB: She has had decreased fetal movement x 3 days.

## 2021-02-20 NOTE — Progress Notes (Signed)
Subjective:   Natasha Sloan is a 30 y.o. [id number] [redacted]w[redacted]d being seen today for her prenatal visit.    Patient reports decreased fetal movement x three (3) days.   Denies contractions, vaginal bleeding or leaking of fluid.    The following portions of the patient's history were reviewed and updated as appropriate: allergies, current medications, past family history, past medical history, past social history, past surgical history and problem list.   Review of Systems:  ROS negative except as noted above. Information obtained from patient.   Objective:   BP 100/66   Pulse 99   Wt 150 lb 6.4 oz (68.2 kg)   LMP 05/29/2020 (Exact Date) Comment: bleeding 05/29/20-06/06/20, but may have had SAB at this time  BMI 27.51 kg/m   FHT: Fetal Heart Rate (bpm): 130  Fetal Movement: Movement: (!) Decreased   Presentation: Presentation: Vertex    Abdomen:  soft, gravid, appropriate for gestational age,non-tender  Cervix: fingertip ,Long,-3, firm,posterior    Results for orders placed or performed in visit on 02/20/21 (from the past 24 hour(s))  POC Urinalysis Dipstick OB     Status: Abnormal   Collection Time: 02/20/21 11:34 AM  Result Value Ref Range   Color, UA     Clarity, UA     Glucose, UA     Bilirubin, UA     Ketones, UA     Spec Grav, UA 1.015 1.010 - 1.025   Blood, UA     pH, UA 6.5 5.0 - 8.0   POC,PROTEIN,UA     Urobilinogen, UA     Nitrite, UA     Leukocytes, UA Moderate (2+) (A) Negative   Appearance     Odor      Assessment:   Pregnancy:  G4P1021 at [redacted]w[redacted]d  1. [redacted] weeks gestation of pregnancy  - POC Urinalysis Dipstick OB  2. Encounter for supervision of other normal pregnancy in third trimester  - POC Urinalysis Dipstick OB  3. GBS bacteriuria   4. Decreased fetal movements in third trimester, single or unspecified fetus   5. Hemorrhoids during pregnancy in third trimester   Plan:   NST reactive, see chart.   Discussed home treatment measures for  management of hemorrhoids, see AVS.   Preterm labor symptoms: vaginal bleeding, contractions, and leaking of fluid reviewed in detail.  Fetal movement precautions reviewed.  Follow up in 1 week for ROB or sooner if needed.   Serafina Royals, CNM [facility], [facility] 02/20/21 3:33 PM

## 2021-02-20 NOTE — Patient Instructions (Addendum)
Hemorrhoids Hemorrhoids are swollen veins that may develop: In the butt (rectum). These are called internal hemorrhoids. Around the opening of the butt (anus). These are called external hemorrhoids. Hemorrhoids can cause pain, itching, or bleeding. Most of the time, they do not cause serious problems. They usually get better with diet changes, lifestylechanges, and other home treatments. What are the causes? This condition may be caused by: Having trouble pooping (constipation). Pushing hard (straining) to poop. Watery poop (diarrhea). Pregnancy. Being very overweight (obese). Sitting for long periods of time. Heavy lifting or other activity that causes you to strain. Anal sex. Riding a bike for a long period of time. What are the signs or symptoms? Symptoms of this condition include: Pain. Itching or soreness in the butt. Bleeding from the butt. Leaking poop. Swelling in the area. One or more lumps around the opening of your butt. How is this diagnosed? A doctor can often diagnose this condition by looking at the affected area. The doctor may also: Do an exam that involves feeling the area with a gloved hand (digital rectal exam). Examine the area inside your butt using a small tube (anoscope). Order blood tests. This may be done if you have lost a lot of blood. Have you get a test that involves looking inside the colon using a flexible tube with a camera on the end (sigmoidoscopy or colonoscopy). How is this treated? This condition can usually be treated at home. Your doctor may tell you to change what you eat, make lifestyle changes, or try home treatments. If these do not help, procedures can be done to remove the hemorrhoids or make them smaller. These may involve: Placing rubber bands at the base of the hemorrhoids to cut off their blood supply. Injecting medicine into the hemorrhoids to shrink them. Shining a type of light energy onto the hemorrhoids to cause them to fall  off. Doing surgery to remove the hemorrhoids or cut off their blood supply. Follow these instructions at home: Eating and drinking  Eat foods that have a lot of fiber in them. These include whole grains, beans, nuts, fruits, and vegetables. Ask your doctor about taking products that have added fiber (fibersupplements). Reduce the amount of fat in your diet. You can do this by: Eating low-fat dairy products. Eating less red meat. Avoiding processed foods. Drink enough fluid to keep your pee (urine) pale yellow.  Managing pain and swelling  Take a warm-water bath (sitz bath) for 20 minutes to ease pain. Do this 3-4 times a day. You may do this in a bathtub or using a portable sitz bath that fits over the toilet. If told, put ice on the painful area. It may be helpful to use ice between your warm baths. Put ice in a plastic bag. Place a towel between your skin and the bag. Leave the ice on for 20 minutes, 2-3 times a day.  General instructions Take over-the-counter and prescription medicines only as told by your doctor. Medicated creams and medicines may be used as told. Exercise often. Ask your doctor how much and what kind of exercise is best for you. Go to the bathroom when you have the urge to poop. Do not wait. Avoid pushing too hard when you poop. Keep your butt dry and clean. Use wet toilet paper or moist towelettes after pooping. Do not sit on the toilet for a long time. Keep all follow-up visits as told by your doctor. This is important. Contact a doctor if you: Have pain  and swelling that do not get better with treatment or medicine. Have trouble pooping. Cannot poop. Have pain or swelling outside the area of the hemorrhoids. Get help right away if you have: Bleeding that will not stop. Summary Hemorrhoids are swollen veins in the butt or around the opening of the butt. They can cause pain, itching, or bleeding. Eat foods that have a lot of fiber in them. These include  whole grains, beans, nuts, fruits, and vegetables. Take a warm-water bath (sitz bath) for 20 minutes to ease pain. Do this 3-4 times a day. This information is not intended to replace advice given to you by your health care provider. Make sure you discuss any questions you have with your healthcare provider. Document Revised: 08/25/2018 Document Reviewed: 01/06/2018 Elsevier Patient Education  2022 Elsevier Inc.    Fetal Movement Counts Patient Name: ________________________________________________ Patient DueDate: ____________________ What is a fetal movement count?  A fetal movement count is the number of times that you feel your baby move during a certain amount of time. This may also be called a fetal kick count. A fetal movement count is recommended for every pregnant woman. You may be askedto start counting fetal movements as early as week 28 of your pregnancy. Pay attention to when your baby is most active. You may notice your baby's sleep and wake cycles. You may also notice things that make your baby move more. You should do a fetal movement count: When your baby is normally most active. At the same time each day. A good time to count movements is while you are resting, after having somethingto eat and drink. How do I count fetal movements? Find a quiet, comfortable area. Sit, or lie down on your side. Write down the date, the start time and stop time, and the number of movements that you felt between those two times. Take this information with you to your health care visits. Write down your start time when you feel the first movement. Count kicks, flutters, swishes, rolls, and jabs. You should feel at least 10 movements. You may stop counting after you have felt 10 movements, or if you have been counting for 2 hours. Write down the stop time. If you do not feel 10 movements in 2 hours, contact your health care provider for further instructions. Your health care provider may want to  do additional tests to assess your baby's well-being. Contact a health care provider if: You feel fewer than 10 movements in 2 hours. Your baby is not moving like he or she usually does. Date: ____________ Start time: ____________ Stop time: ____________ Movements:____________ Date: ____________ Start time: ____________ Stop time: ____________ Movements:____________ Date: ____________ Start time: ____________ Stop time: ____________ Movements:____________ Date: ____________ Start time: ____________ Stop time: ____________ Movements:____________ Date: ____________ Start time: ____________ Stop time: ____________ Movements:____________ Date: ____________ Start time: ____________ Stop time: ____________ Movements:____________ Date: ____________ Start time: ____________ Stop time: ____________ Movements:____________ Date: ____________ Start time: ____________ Stop time: ____________ Movements:____________ Date: ____________ Start time: ____________ Stop time: ____________ Movements:____________ This information is not intended to replace advice given to you by your health care provider. Make sure you discuss any questions you have with your healthcare provider. Document Revised: 04/06/2019 Document Reviewed: 04/06/2019 Elsevier Patient Education  2022 [employer].

## 2021-02-24 ENCOUNTER — Other Ambulatory Visit: Payer: Self-pay | Admitting: Certified Nurse Midwife

## 2021-02-24 DIAGNOSIS — N898 Other specified noninflammatory disorders of vagina: Secondary | ICD-10-CM

## 2021-02-24 DIAGNOSIS — O2243 Hemorrhoids in pregnancy, third trimester: Secondary | ICD-10-CM

## 2021-02-24 DIAGNOSIS — O26893 Other specified pregnancy related conditions, third trimester: Secondary | ICD-10-CM

## 2021-02-24 MED ORDER — HYDROCORTISONE ACETATE 25 MG RE SUPP: 25.0000 mg | Freq: Two times a day (BID) | RECTAL | 0 refills | Status: DC

## 2021-02-24 MED ORDER — METRONIDAZOLE 500 MG PO TABS: 500.0000 mg | ORAL_TABLET | Freq: Two times a day (BID) | ORAL | 0 refills | Status: AC

## 2021-02-24 NOTE — Progress Notes (Signed)
Rx Flagyl and Anusol-HC, see orders.    Natasha Sloan, CNM [facility], [facility] 02/24/21 2:31 PM

## 2021-02-27 ENCOUNTER — Encounter: Payer: Self-pay | Admitting: Certified Nurse Midwife

## 2021-02-27 ENCOUNTER — Ambulatory Visit (INDEPENDENT_AMBULATORY_CARE_PROVIDER_SITE_OTHER): Payer: Medicaid Other | Admitting: Certified Nurse Midwife

## 2021-02-27 ENCOUNTER — Other Ambulatory Visit: Payer: Self-pay

## 2021-02-27 VITALS — BP 96/65 | HR 84 | Wt 155.2 lb

## 2021-02-27 DIAGNOSIS — Z3483 Encounter for supervision of other normal pregnancy, third trimester: Secondary | ICD-10-CM

## 2021-02-27 DIAGNOSIS — Z3A37 37 weeks gestation of pregnancy: Secondary | ICD-10-CM

## 2021-02-27 DIAGNOSIS — O09293 Supervision of pregnancy with other poor reproductive or obstetric history, third trimester: Secondary | ICD-10-CM

## 2021-02-27 DIAGNOSIS — Z8759 Personal history of other complications of pregnancy, childbirth and the puerperium: Secondary | ICD-10-CM

## 2021-02-27 HISTORY — DX: Personal history of other complications of pregnancy, childbirth and the puerperium: Z87.59

## 2021-02-27 LAB — POCT URINALYSIS DIPSTICK OB
Bilirubin, UA: NEGATIVE
Glucose, UA: NEGATIVE
Ketones, UA: NEGATIVE
Nitrite, UA: NEGATIVE
POC,PROTEIN,UA: NEGATIVE
Spec Grav, UA: 1.01 (ref 1.010–1.025)
Urobilinogen, UA: 0.2 E.U./dL
pH, UA: 7.5 (ref 5.0–8.0)

## 2021-02-27 NOTE — Patient Instructions (Signed)
Fetal Movement Counts Patient Name: ________________________________________________ Patient DueDate: ____________________ What is a fetal movement count?  A fetal movement count is the number of times that you feel your baby move during a certain amount of time. This may also be called a fetal kick count. A fetal movement count is recommended for every pregnant woman. You may be askedto start counting fetal movements as early as week 28 of your pregnancy. Pay attention to when your baby is most active. You may notice your baby's sleep and wake cycles. You may also notice things that make your baby move more. You should do a fetal movement count: When your baby is normally most active. At the same time each day. A good time to count movements is while you are resting, after having somethingto eat and drink. How do I count fetal movements? Find a quiet, comfortable area. Sit, or lie down on your side. Write down the date, the start time and stop time, and the number of movements that you felt between those two times. Take this information with you to your health care visits. Write down your start time when you feel the first movement. Count kicks, flutters, swishes, rolls, and jabs. You should feel at least 10 movements. You may stop counting after you have felt 10 movements, or if you have been counting for 2 hours. Write down the stop time. If you do not feel 10 movements in 2 hours, contact your health care provider for further instructions. Your health care provider may want to do additional tests to assess your baby's well-being. Contact a health care provider if: You feel fewer than 10 movements in 2 hours. Your baby is not moving like he or she usually does. Date: ____________ Start time: ____________ Stop time: ____________ Movements:____________ Date: ____________ Start time: ____________ Stop time: ____________ Movements:____________ Date: ____________ Start time: ____________ Stop  time: ____________ Movements:____________ Date: ____________ Start time: ____________ Stop time: ____________ Movements:____________ Date: ____________ Start time: ____________ Stop time: ____________ Movements:____________ Date: ____________ Start time: ____________ Stop time: ____________ Movements:____________ Date: ____________ Start time: ____________ Stop time: ____________ Movements:____________ Date: ____________ Start time: ____________ Stop time: ____________ Movements:____________ Date: ____________ Start time: ____________ Stop time: ____________ Movements:____________ This information is not intended to replace advice given to you by your health care provider. Make sure you discuss any questions you have with your healthcare provider. Document Revised: 04/06/2019 Document Reviewed: 04/06/2019 Elsevier Patient Education  2022 Elsevier Inc.  

## 2021-02-27 NOTE — Progress Notes (Signed)
ROB: She is doing well today, she has a small amount of bleeding. Not able to leave a urine sample.

## 2021-02-27 NOTE — Progress Notes (Signed)
ROB-Reports spotting for the last few days, taking Flagyl for treatment of BV. Questions answered; history of sulcus laceration with repair in OR and postpartum hemorrhage. Anticipatory guidance regarding course of prenatal care. Reviewed red flag symptoms and when to call. RTC x 1 week for ROB or sooner if needed.

## 2021-02-28 HISTORY — PX: WISDOM TOOTH EXTRACTION: SHX21

## 2021-03-05 ENCOUNTER — Other Ambulatory Visit: Payer: Self-pay

## 2021-03-05 ENCOUNTER — Other Ambulatory Visit (HOSPITAL_COMMUNITY)
Admission: RE | Admit: 2021-03-05 | Discharge: 2021-03-05 | Disposition: A | Discharge status: Home / Self Care | Payer: Medicaid Other | Source: Ambulatory Visit | Attending: Certified Nurse Midwife | Admitting: Certified Nurse Midwife

## 2021-03-05 ENCOUNTER — Ambulatory Visit (INDEPENDENT_AMBULATORY_CARE_PROVIDER_SITE_OTHER): Payer: Medicaid Other | Admitting: Certified Nurse Midwife

## 2021-03-05 ENCOUNTER — Encounter: Payer: Self-pay | Admitting: Certified Nurse Midwife

## 2021-03-05 VITALS — BP 104/70 | HR 92 | Wt 155.6 lb

## 2021-03-05 DIAGNOSIS — N898 Other specified noninflammatory disorders of vagina: Secondary | ICD-10-CM | POA: Diagnosis not present

## 2021-03-05 DIAGNOSIS — Z3403 Encounter for supervision of normal first pregnancy, third trimester: Secondary | ICD-10-CM

## 2021-03-05 LAB — POCT URINALYSIS DIPSTICK OB
Bilirubin, UA: NEGATIVE
Glucose, UA: NEGATIVE
Ketones, UA: NEGATIVE
Nitrite, UA: NEGATIVE
Spec Grav, UA: >=1.03 — AB (ref 1.010–1.025)
Urobilinogen, UA: 0.2 E.U./dL
pH, UA: 5 (ref 5.0–8.0)

## 2021-03-05 NOTE — Patient Instructions (Signed)
[employer]'s Emergency Medicine: Concepts and Clinical Practice (9th ed., pp. 2296- 2312). Elsevier.">  Braxton Hicks Contractions  Contractions of the uterus can occur throughout pregnancy, but they are not always a sign that you are in labor. You may have practice contractions called Braxton Hicks contractions. These false labor contractions are sometimesconfused with true labor. What are Braxton Hicks contractions? Braxton Hicks contractions are tightening movements that occur in the muscles of the uterus before labor. Unlike true labor contractions, these contractions do not result in opening (dilation) and thinning of the lowest part of the uterus (cervix). Toward the end of pregnancy (32-34 weeks), Braxton Hicks contractions can happen more often and may become stronger. These contractions are sometimesdifficult to tell apart from true labor because they can be very uncomfortable. How to tell the difference between true labor and false labor True labor Contractions last 30-70 seconds. Contractions become very regular. Discomfort is usually felt in the top of the uterus, and it spreads to the lower abdomen and low back. Contractions do not go away with walking. Contractions usually become stronger and more frequent. The cervix dilates and gets thinner. False labor Contractions are usually shorter, weaker, and farther apart than true labor contractions. Contractions are usually irregular. Contractions are often felt in the front of the lower abdomen and in the groin. Contractions may go away when you walk around or change positions while lying down. The cervix usually does not dilate or become thin. Sometimes, the only way to tell if you are in true labor is for your healthcare provider to look for changes in your cervix. Your health care provider will do a physical exam and may monitor your contractions. If you are in true labor, your health care provider will send youhome with instructions  about when to return to the hospital. You may continue to have Braxton Hicks contractions until you go into truelabor. Follow these instructions at home:  Take over-the-counter and prescription medicines only as told by your health care provider. If Braxton Hicks contractions are making you uncomfortable: Change your position from lying down or resting to walking, or change from walking to resting. Sit and rest in a tub of warm water. Drink enough fluid to keep your urine pale yellow. Dehydration may cause these contractions. Do slow and deep breathing several times an hour. Keep all follow-up visits. This is important. Contact a health care provider if: You have a fever. You have continuous pain in your abdomen. Your contractions become stronger, more regular, and closer together. You pass blood-tinged mucus. Get help right away if: You have fluid leaking or gushing from your vagina. You have bright red blood coming from your vagina. Your baby is not moving inside you as much as it used to. Summary You may have practice contractions called Braxton Hicks contractions. These false labor contractions are sometimes confused with true labor. Braxton Hicks contractions are usually shorter, weaker, farther apart, and less regular than true labor contractions. True labor contractions usually become stronger, more regular, and more frequent. Manage discomfort from Braxton Hicks contractions by changing position, resting in a warm bath, practicing deep breathing, and drinking plenty of water. Keep all follow-up visits. Contact your health care provider if your contractions become stronger, more regular, and closer together. This information is not intended to replace advice given to you by your health care provider. Make sure you discuss any questions you have with your healthcare provider. Document Revised: 06/24/2020 Document Reviewed: 06/24/2020 Elsevier Patient Education  2022 Elsevier    Inc.  

## 2021-03-05 NOTE — Progress Notes (Signed)
ROB doing well. Feels good movement. Discussed labor precautions. Pt c/o vaginal discharge with white chunks, she request swab with testing for STD, state she is worried as she had tested positive at the beginning of her pregnancy. Swab collected. SVE per pt request. 1.5/50/-3 . Discussed NST at 40 wks. She verbalizes and agrees. Follow up 1 wk for Rob.   Doreene Burke, CNM

## 2021-03-07 LAB — CERVICOVAGINAL ANCILLARY ONLY
Bacterial Vaginitis (gardnerella): NEGATIVE
Candida Glabrata: NEGATIVE
Candida Vaginitis: POSITIVE — AB
Chlamydia: NEGATIVE
Comment: NEGATIVE
Comment: NEGATIVE
Comment: NEGATIVE
Comment: NEGATIVE
Comment: NEGATIVE
Comment: NORMAL
Neisseria Gonorrhea: NEGATIVE
Trichomonas: NEGATIVE

## 2021-03-08 ENCOUNTER — Other Ambulatory Visit: Payer: Self-pay | Admitting: Certified Nurse Midwife

## 2021-03-08 MED ORDER — FLUCONAZOLE 150 MG PO TABS: 150.0000 mg | ORAL_TABLET | Freq: Once | ORAL | 0 refills | Status: AC

## 2021-03-11 ENCOUNTER — Encounter: Payer: Self-pay | Admitting: Certified Nurse Midwife

## 2021-03-11 ENCOUNTER — Ambulatory Visit (INDEPENDENT_AMBULATORY_CARE_PROVIDER_SITE_OTHER): Payer: Medicaid Other | Admitting: Certified Nurse Midwife

## 2021-03-11 ENCOUNTER — Other Ambulatory Visit: Payer: Self-pay

## 2021-03-11 VITALS — BP 104/70 | HR 85 | Wt 152.7 lb

## 2021-03-11 DIAGNOSIS — O368190 Decreased fetal movements, unspecified trimester, not applicable or unspecified: Secondary | ICD-10-CM | POA: Diagnosis not present

## 2021-03-11 DIAGNOSIS — Z3403 Encounter for supervision of normal first pregnancy, third trimester: Secondary | ICD-10-CM

## 2021-03-11 DIAGNOSIS — Z3A39 39 weeks gestation of pregnancy: Secondary | ICD-10-CM

## 2021-03-11 LAB — POCT URINALYSIS DIPSTICK OB
Blood, UA: NEGATIVE
Glucose, UA: NEGATIVE
Leukocytes, UA: NEGATIVE
POC,PROTEIN,UA: NEGATIVE
Spec Grav, UA: 1.01 (ref 1.010–1.025)
Urobilinogen, UA: 0.2 E.U./dL
pH, UA: 7 (ref 5.0–8.0)

## 2021-03-11 NOTE — Progress Notes (Signed)
ROB: She is here today for decreased fetal movement. She reports that baby only moved about 8 times yesterday and about 3 times today. She was concerned so she was brought in to be placed on NST and evaluated.

## 2021-03-11 NOTE — Progress Notes (Signed)
ROB and NST, pt c/o decreased fetal movement. Placed on EFM for NST. SVE per pt request.   Catergory 1  Baseline 130 Accelerations present Decelerations absent Moderate variability No ctx noted.   Follow up Monday/Tuesday for NST and ROB with Pattricia Boss

## 2021-03-11 NOTE — Patient Instructions (Signed)
[employer]'s Emergency Medicine: Concepts and Clinical Practice (9th ed., pp. 2296- 2312). Elsevier.">  Braxton Hicks Contractions  Contractions of the uterus can occur throughout pregnancy, but they are not always a sign that you are in labor. You may have practice contractions called Braxton Hicks contractions. These false labor contractions are sometimesconfused with true labor. What are Braxton Hicks contractions? Braxton Hicks contractions are tightening movements that occur in the muscles of the uterus before labor. Unlike true labor contractions, these contractions do not result in opening (dilation) and thinning of the lowest part of the uterus (cervix). Toward the end of pregnancy (32-34 weeks), Braxton Hicks contractions can happen more often and may become stronger. These contractions are sometimesdifficult to tell apart from true labor because they can be very uncomfortable. How to tell the difference between true labor and false labor True labor Contractions last 30-70 seconds. Contractions become very regular. Discomfort is usually felt in the top of the uterus, and it spreads to the lower abdomen and low back. Contractions do not go away with walking. Contractions usually become stronger and more frequent. The cervix dilates and gets thinner. False labor Contractions are usually shorter, weaker, and farther apart than true labor contractions. Contractions are usually irregular. Contractions are often felt in the front of the lower abdomen and in the groin. Contractions may go away when you walk around or change positions while lying down. The cervix usually does not dilate or become thin. Sometimes, the only way to tell if you are in true labor is for your healthcare provider to look for changes in your cervix. Your health care provider will do a physical exam and may monitor your contractions. If you are in true labor, your health care provider will send youhome with instructions  about when to return to the hospital. You may continue to have Braxton Hicks contractions until you go into truelabor. Follow these instructions at home:  Take over-the-counter and prescription medicines only as told by your health care provider. If Braxton Hicks contractions are making you uncomfortable: Change your position from lying down or resting to walking, or change from walking to resting. Sit and rest in a tub of warm water. Drink enough fluid to keep your urine pale yellow. Dehydration may cause these contractions. Do slow and deep breathing several times an hour. Keep all follow-up visits. This is important. Contact a health care provider if: You have a fever. You have continuous pain in your abdomen. Your contractions become stronger, more regular, and closer together. You pass blood-tinged mucus. Get help right away if: You have fluid leaking or gushing from your vagina. You have bright red blood coming from your vagina. Your baby is not moving inside you as much as it used to. Summary You may have practice contractions called Braxton Hicks contractions. These false labor contractions are sometimes confused with true labor. Braxton Hicks contractions are usually shorter, weaker, farther apart, and less regular than true labor contractions. True labor contractions usually become stronger, more regular, and more frequent. Manage discomfort from Braxton Hicks contractions by changing position, resting in a warm bath, practicing deep breathing, and drinking plenty of water. Keep all follow-up visits. Contact your health care provider if your contractions become stronger, more regular, and closer together. This information is not intended to replace advice given to you by your health care provider. Make sure you discuss any questions you have with your healthcare provider. Document Revised: 06/24/2020 Document Reviewed: 06/24/2020 Elsevier Patient Education  2022 Elsevier    Inc.  

## 2021-03-12 ENCOUNTER — Other Ambulatory Visit: Payer: Self-pay

## 2021-03-12 ENCOUNTER — Encounter: Payer: Self-pay | Admitting: Obstetrics and Gynecology

## 2021-03-12 ENCOUNTER — Observation Stay (HOSPITAL_BASED_OUTPATIENT_CLINIC_OR_DEPARTMENT_OTHER)
Admission: EM | Admit: 2021-03-12 | Discharge: 2021-03-13 | Disposition: A | Discharge status: Home / Self Care | Payer: Medicaid Other | Source: Home / Self Care | Admitting: Certified Nurse Midwife

## 2021-03-12 DIAGNOSIS — O471 False labor at or after 37 completed weeks of gestation: Secondary | ICD-10-CM | POA: Insufficient documentation

## 2021-03-12 DIAGNOSIS — O368130 Decreased fetal movements, third trimester, not applicable or unspecified: Secondary | ICD-10-CM | POA: Diagnosis not present

## 2021-03-12 DIAGNOSIS — R8271 Bacteriuria: Secondary | ICD-10-CM

## 2021-03-12 DIAGNOSIS — Z3A39 39 weeks gestation of pregnancy: Secondary | ICD-10-CM

## 2021-03-12 DIAGNOSIS — Z2839 Other underimmunization status: Secondary | ICD-10-CM

## 2021-03-12 DIAGNOSIS — O09899 Supervision of other high risk pregnancies, unspecified trimester: Secondary | ICD-10-CM

## 2021-03-12 NOTE — OB Triage Note (Signed)
    L&D OB Triage Note  SUBJECTIVE Natasha Sloan is a 30 y.o. [id number] female at [redacted]w[redacted]d, EDD Estimated Date of Delivery: 03/15/21 who presented to triage with complaints of ctx. Denies leaking of fluid and vaginal bleeding. Feels good fetal movement.   OB History  Gravida Para Term Preterm AB Living  4 1 1  0 2 1  SAB IAB Ectopic Multiple Live Births  2 0 0 0 1    # Outcome Date GA Lbr Len/2nd Weight Sex Delivery Anes PTL Lv  4 Current           3 SAB 05/2020          2 SAB 2012          1 Term 04/08/10 [redacted]w[redacted]d  2608 g M    LIV     Birth Comments: [facility]    Medications Prior to Admission  Medication Sig Dispense Refill Last Dose   albuterol (VENTOLIN HFA) 108 (90 Base) MCG/ACT inhaler Inhale 2 puffs into the lungs every 6 (six) hours as needed for wheezing or shortness of breath. 8 g 2 Past Month   Prenat-FeAsp-Meth-FA-DHA w/o A (PRENATE PIXIE) 10-0.6-0.4-200 MG CAPS Take 200 mg by mouth daily. 30 capsule 11 03/12/2021   hydrocortisone (ANUSOL-HC) 25 MG suppository Place 1 suppository (25 mg total) rectally 2 (two) times daily. 12 suppository 0 Not Taking   Prenatal 27-1 MG TABS Take by mouth.        OBJECTIVE  Nursing Evaluation:   BP 108/62 (BP Location: Left Arm)   Pulse 84   Temp 98.4 F (36.9 C) (Oral)   Resp 16   LMP 05/29/2020 (Exact Date) Comment: bleeding 05/29/20-06/06/20, but may have had SAB at this time   Findings:   irregular mild ctx.            NST was performed and has been reviewed by me.  NST INTERPRETATION: Category I  Mode: External Baseline Rate (A): 135 bpm (fht)  Moderate variability  Accelerations present Decelerations absent   Ctx q 10-15 min.      ASSESSMENT Impression:  1.  Pregnancy:  08/06/20 at [redacted]w[redacted]d , EDD Estimated Date of Delivery: 03/15/21 2.  Reassuring fetal and maternal status 3.  No cervical change from office visit yesterday  PLAN 1. Discussed current condition and above findings with patient and reassurance given.  All questions  answered. 2. Discharge home with standard labor precautions given to return to L&D or call the office for problems. 3. Continue routine prenatal care.  03/17/21, CNM

## 2021-03-12 NOTE — OB Triage Note (Addendum)
Patient is a  30 yo, G4P1, at 39 weeks 4 days. Patient presents with complaints of contractions every 15 minutes that started at approximately 1830 today rated 8/10. Pt states "I was confused about when to come in to be seen." Pt reports loss of mucous plug and bloody show. Pt denies any LOF. + FM. Monitors applied and assessing. VSS. Initial fetal heart tone 135. Will continue to monitor.

## 2021-03-12 NOTE — Progress Notes (Signed)
Discharge instructions provided to patient. Patient verbalized understanding. Red flag signs reviewed by RN. Patient discharged home with her mother.

## 2021-03-13 ENCOUNTER — Inpatient Hospital Stay: Payer: Medicaid Other | Admitting: Registered Nurse

## 2021-03-13 ENCOUNTER — Encounter: Payer: Medicaid Other | Admitting: Certified Nurse Midwife

## 2021-03-13 ENCOUNTER — Inpatient Hospital Stay
Admission: EM | Admit: 2021-03-13 | Discharge: 2021-03-14 | DRG: 807 | Disposition: A | Discharge status: Home / Self Care | Payer: Medicaid Other | Attending: Certified Nurse Midwife | Admitting: Certified Nurse Midwife

## 2021-03-13 ENCOUNTER — Encounter: Payer: Self-pay | Admitting: Certified Nurse Midwife

## 2021-03-13 DIAGNOSIS — R8271 Bacteriuria: Secondary | ICD-10-CM | POA: Diagnosis present

## 2021-03-13 DIAGNOSIS — O0930 Supervision of pregnancy with insufficient antenatal care, unspecified trimester: Secondary | ICD-10-CM

## 2021-03-13 DIAGNOSIS — Z20822 Contact with and (suspected) exposure to covid-19: Secondary | ICD-10-CM | POA: Diagnosis present

## 2021-03-13 DIAGNOSIS — Z2839 Other underimmunization status: Secondary | ICD-10-CM

## 2021-03-13 DIAGNOSIS — J45909 Unspecified asthma, uncomplicated: Secondary | ICD-10-CM | POA: Diagnosis present

## 2021-03-13 DIAGNOSIS — Z3A39 39 weeks gestation of pregnancy: Secondary | ICD-10-CM | POA: Diagnosis not present

## 2021-03-13 DIAGNOSIS — O09899 Supervision of other high risk pregnancies, unspecified trimester: Secondary | ICD-10-CM

## 2021-03-13 DIAGNOSIS — O9952 Diseases of the respiratory system complicating childbirth: Principal | ICD-10-CM | POA: Diagnosis present

## 2021-03-13 DIAGNOSIS — O09293 Supervision of pregnancy with other poor reproductive or obstetric history, third trimester: Secondary | ICD-10-CM

## 2021-03-13 DIAGNOSIS — Z23 Encounter for immunization: Secondary | ICD-10-CM

## 2021-03-13 DIAGNOSIS — O26893 Other specified pregnancy related conditions, third trimester: Secondary | ICD-10-CM | POA: Diagnosis present

## 2021-03-13 DIAGNOSIS — Z6740 Type O blood, Rh positive: Secondary | ICD-10-CM | POA: Diagnosis present

## 2021-03-13 DIAGNOSIS — Z8759 Personal history of other complications of pregnancy, childbirth and the puerperium: Secondary | ICD-10-CM

## 2021-03-13 DIAGNOSIS — O4690 Antepartum hemorrhage, unspecified, unspecified trimester: Secondary | ICD-10-CM | POA: Diagnosis present

## 2021-03-13 HISTORY — DX: Unspecified asthma, uncomplicated: J45.909

## 2021-03-13 LAB — RESP PANEL BY RT-PCR (FLU A&B, COVID) ARPGX2
Influenza A by PCR: NEGATIVE
Influenza B by PCR: NEGATIVE
SARS Coronavirus 2 by RT PCR: NEGATIVE

## 2021-03-13 LAB — CBC
HCT: 37.7 % (ref 36.0–46.0)
Hemoglobin: 13.2 g/dL (ref 12.0–15.0)
MCH: 29.3 pg (ref 26.0–34.0)
MCHC: 35 g/dL (ref 30.0–36.0)
MCV: 83.6 fL (ref 80.0–100.0)
Platelets: 205 10*3/uL (ref 150–400)
RBC: 4.51 MIL/uL (ref 3.87–5.11)
RDW: 13.4 % (ref 11.5–15.5)
WBC: 11.9 10*3/uL — ABNORMAL HIGH (ref 4.0–10.5)
nRBC: 0 % (ref 0.0–0.2)

## 2021-03-13 LAB — TYPE AND SCREEN
ABO/RH(D): O POS
Antibody Screen: NEGATIVE

## 2021-03-13 MED ORDER — ACETAMINOPHEN 325 MG PO TABS
650.0000 mg | ORAL_TABLET | ORAL | Status: DC | PRN
  Administered 2021-03-13: 650 mg via ORAL
  Filled 2021-03-13: qty 2

## 2021-03-13 MED ORDER — FENTANYL CITRATE (PF) 100 MCG/2ML IJ SOLN
50.0000 ug | INTRAMUSCULAR | Status: DC | PRN
  Administered 2021-03-13: 100 ug via INTRAVENOUS
  Filled 2021-03-13: qty 2

## 2021-03-13 MED ORDER — ONDANSETRON HCL 4 MG/2ML IJ SOLN
4.0000 mg | Freq: Four times a day (QID) | INTRAMUSCULAR | Status: DC | PRN
  Administered 2021-03-13: 4 mg via INTRAVENOUS
  Filled 2021-03-13: qty 2

## 2021-03-13 MED ORDER — OXYCODONE-ACETAMINOPHEN 5-325 MG PO TABS: 2.0000 | ORAL_TABLET | ORAL | Status: DC | PRN

## 2021-03-13 MED ORDER — IBUPROFEN 600 MG PO TABS: 600.0000 mg | ORAL_TABLET | Freq: Four times a day (QID) | ORAL | Status: DC

## 2021-03-13 MED ORDER — PHENYLEPHRINE 40 MCG/ML (10ML) SYRINGE FOR IV PUSH (FOR BLOOD PRESSURE SUPPORT): 80.0000 ug | PREFILLED_SYRINGE | INTRAVENOUS | Status: DC | PRN

## 2021-03-13 MED ORDER — SODIUM CHLORIDE 0.9 % IV SOLN
1.0000 g | INTRAVENOUS | Status: DC
  Administered 2021-03-13: 1 g via INTRAVENOUS
  Filled 2021-03-13 (×3): qty 1000

## 2021-03-13 MED ORDER — METHYLERGONOVINE MALEATE 0.2 MG PO TABS
0.2000 mg | ORAL_TABLET | ORAL | Status: DC | PRN
Start: 2021-03-13 — End: 2021-03-15

## 2021-03-13 MED ORDER — SODIUM CHLORIDE 0.9% FLUSH: 3.0000 mL | INTRAVENOUS | Status: DC | PRN

## 2021-03-13 MED ORDER — LIDOCAINE HCL (PF) 1 % IJ SOLN
INTRAMUSCULAR | Status: DC | PRN
  Administered 2021-03-13: 3 mL via SUBCUTANEOUS

## 2021-03-13 MED ORDER — FENTANYL 2.5 MCG/ML W/ROPIVACAINE 0.15% IN NS 100 ML EPIDURAL (ARMC)
EPIDURAL | Status: AC
  Filled 2021-03-13: qty 100

## 2021-03-13 MED ORDER — COCONUT OIL OIL
1.0000 "application " | TOPICAL_OIL | Status: DC | PRN
  Administered 2021-03-14: 1 via TOPICAL
  Filled 2021-03-13: qty 120

## 2021-03-13 MED ORDER — ONDANSETRON HCL 4 MG PO TABS: 4.0000 mg | ORAL_TABLET | ORAL | Status: DC | PRN

## 2021-03-13 MED ORDER — DIBUCAINE (PERIANAL) 1 % EX OINT: 1.0000 "application " | TOPICAL_OINTMENT | CUTANEOUS | Status: DC | PRN

## 2021-03-13 MED ORDER — SODIUM CHLORIDE 0.9 % IV SOLN: 250.0000 mL | INTRAVENOUS | Status: DC | PRN

## 2021-03-13 MED ORDER — FENTANYL 2.5 MCG/ML W/ROPIVACAINE 0.15% IN NS 100 ML EPIDURAL (ARMC)
EPIDURAL | Status: DC | PRN
  Administered 2021-03-13: 12 mL/h via EPIDURAL

## 2021-03-13 MED ORDER — WITCH HAZEL-GLYCERIN EX PADS: 1.0000 "application " | MEDICATED_PAD | CUTANEOUS | Status: DC | PRN

## 2021-03-13 MED ORDER — MISOPROSTOL 200 MCG PO TABS
1000.0000 ug | ORAL_TABLET | Freq: Once | ORAL | Status: DC | PRN
Start: 2021-03-13 — End: 2021-03-15
  Filled 2021-03-13: qty 5

## 2021-03-13 MED ORDER — ALBUTEROL SULFATE HFA 108 (90 BASE) MCG/ACT IN AERS: 2.0000 | INHALATION_SPRAY | Freq: Four times a day (QID) | RESPIRATORY_TRACT | Status: DC | PRN

## 2021-03-13 MED ORDER — TRANEXAMIC ACID-NACL 1000-0.7 MG/100ML-% IV SOLN
1000.0000 mg | INTRAVENOUS | Status: AC
  Administered 2021-03-13: 1000 mg via INTRAVENOUS
  Filled 2021-03-13: qty 100

## 2021-03-13 MED ORDER — OXYTOCIN-SODIUM CHLORIDE 30-0.9 UT/500ML-% IV SOLN
2.5000 [IU]/h | INTRAVENOUS | Status: DC
  Filled 2021-03-13: qty 1000

## 2021-03-13 MED ORDER — LIDOCAINE-EPINEPHRINE (PF) 1.5 %-1:200000 IJ SOLN
INTRAMUSCULAR | Status: DC | PRN
  Administered 2021-03-13: 3 mL via EPIDURAL

## 2021-03-13 MED ORDER — BUPIVACAINE HCL (PF) 0.25 % IJ SOLN
INTRAMUSCULAR | Status: DC | PRN
  Administered 2021-03-13 (×2): 4 mL via EPIDURAL

## 2021-03-13 MED ORDER — EPHEDRINE 5 MG/ML INJ: 10.0000 mg | INTRAVENOUS | Status: DC | PRN

## 2021-03-13 MED ORDER — FENTANYL 2.5 MCG/ML W/ROPIVACAINE 0.15% IN NS 100 ML EPIDURAL (ARMC)
12.0000 mL/h | EPIDURAL | Status: DC
  Administered 2021-03-13: 12 mL/h via EPIDURAL

## 2021-03-13 MED ORDER — LACTATED RINGERS IV SOLN
500.0000 mL | Freq: Once | INTRAVENOUS | Status: AC
Start: 2021-03-13 — End: 2021-03-13
  Administered 2021-03-13: 500 mL via INTRAVENOUS

## 2021-03-13 MED ORDER — DIPHENHYDRAMINE HCL 25 MG PO CAPS: 25.0000 mg | ORAL_CAPSULE | Freq: Four times a day (QID) | ORAL | Status: DC | PRN

## 2021-03-13 MED ORDER — SIMETHICONE 80 MG PO CHEW: 80.0000 mg | CHEWABLE_TABLET | ORAL | Status: DC | PRN

## 2021-03-13 MED ORDER — IBUPROFEN 600 MG PO TABS
600.0000 mg | ORAL_TABLET | Freq: Four times a day (QID) | ORAL | Status: DC
  Administered 2021-03-14 (×4): 600 mg via ORAL
  Filled 2021-03-13 (×4): qty 1

## 2021-03-13 MED ORDER — SODIUM CHLORIDE 0.9 % IV SOLN
2.0000 g | Freq: Once | INTRAVENOUS | Status: AC
  Administered 2021-03-13: 2 g via INTRAVENOUS
  Filled 2021-03-13: qty 2000

## 2021-03-13 MED ORDER — DIPHENHYDRAMINE HCL 50 MG/ML IJ SOLN: 12.5000 mg | INTRAMUSCULAR | Status: DC | PRN

## 2021-03-13 MED ORDER — ONDANSETRON HCL 4 MG/2ML IJ SOLN: 4.0000 mg | INTRAMUSCULAR | Status: DC | PRN

## 2021-03-13 MED ORDER — IBUPROFEN 600 MG PO TABS
ORAL_TABLET | ORAL | Status: AC
  Administered 2021-03-13: 600 mg via ORAL
  Filled 2021-03-13: qty 1

## 2021-03-13 MED ORDER — OXYTOCIN BOLUS FROM INFUSION
333.0000 mL | Freq: Once | INTRAVENOUS | Status: AC
  Administered 2021-03-13: 333 mL via INTRAVENOUS

## 2021-03-13 MED ORDER — ACETAMINOPHEN 500 MG PO TABS
1000.0000 mg | ORAL_TABLET | Freq: Four times a day (QID) | ORAL | Status: DC | PRN
  Administered 2021-03-14 (×3): 1000 mg via ORAL
  Filled 2021-03-13 (×3): qty 2

## 2021-03-13 MED ORDER — ALBUTEROL SULFATE (2.5 MG/3ML) 0.083% IN NEBU: 2.5000 mg | INHALATION_SOLUTION | Freq: Four times a day (QID) | RESPIRATORY_TRACT | Status: DC | PRN

## 2021-03-13 MED ORDER — SODIUM CHLORIDE 0.9% FLUSH: 3.0000 mL | Freq: Two times a day (BID) | INTRAVENOUS | Status: DC

## 2021-03-13 MED ORDER — LIDOCAINE HCL (PF) 1 % IJ SOLN: 30.0000 mL | INTRAMUSCULAR | Status: DC | PRN

## 2021-03-13 MED ORDER — TERBUTALINE SULFATE 1 MG/ML IJ SOLN: 0.2500 mg | Freq: Once | INTRAMUSCULAR | Status: DC | PRN

## 2021-03-13 MED ORDER — LACTATED RINGERS IV SOLN: INTRAVENOUS | Status: DC

## 2021-03-13 MED ORDER — BENZOCAINE-MENTHOL 20-0.5 % EX AERO: 1.0000 "application " | INHALATION_SPRAY | CUTANEOUS | Status: DC | PRN

## 2021-03-13 MED ORDER — MISOPROSTOL 200 MCG PO TABS
1000.0000 ug | ORAL_TABLET | Freq: Once | ORAL | Status: DC
  Filled 2021-03-13: qty 5

## 2021-03-13 MED ORDER — OXYCODONE-ACETAMINOPHEN 5-325 MG PO TABS: 1.0000 | ORAL_TABLET | ORAL | Status: DC | PRN

## 2021-03-13 MED ORDER — PRENATAL MULTIVITAMIN CH
1.0000 | ORAL_TABLET | Freq: Every day | ORAL | Status: DC
  Administered 2021-03-14: 1 via ORAL
  Filled 2021-03-13: qty 1

## 2021-03-13 MED ORDER — SENNOSIDES-DOCUSATE SODIUM 8.6-50 MG PO TABS
2.0000 | ORAL_TABLET | ORAL | Status: DC
  Administered 2021-03-13 – 2021-03-14 (×2): 2 via ORAL
  Filled 2021-03-13 (×2): qty 2

## 2021-03-13 MED ORDER — SOD CITRATE-CITRIC ACID 500-334 MG/5ML PO SOLN: 30.0000 mL | ORAL | Status: DC | PRN

## 2021-03-13 MED ORDER — METHYLERGONOVINE MALEATE 0.2 MG/ML IJ SOLN: 0.2000 mg | INTRAMUSCULAR | Status: DC | PRN

## 2021-03-13 MED ORDER — LACTATED RINGERS IV SOLN: 500.0000 mL | INTRAVENOUS | Status: DC | PRN

## 2021-03-13 MED ORDER — OXYTOCIN-SODIUM CHLORIDE 30-0.9 UT/500ML-% IV SOLN
1.0000 m[IU]/min | INTRAVENOUS | Status: DC
  Administered 2021-03-13: 4 m[IU]/min via INTRAVENOUS

## 2021-03-13 NOTE — H&P (Signed)
Obstetric History and Physical  Natasha Sloan is a 30 y.o. [id number] with IUP at [redacted]w[redacted]d presenting with painful, regular contractions.   Patient states she has been having  regular, every two (2) to four (4) minutes contractions, minimal vaginal bleeding, intact membranes, with active fetal movement.    Denies difficulty breathing or respirator distress, chest pain, dysuria, and leg pain or swelling.   Prenatal Course  Source of Care: [facility]-initial visit at 17 weeks, total visits: 12   Pregnancy complications or risks: Rh positive, Second trimester bleeding, Late prenatal care, GBS bacteriuria, History of postpartum hemorrhage  Prenatal labs and studies:  ABO, Rh: --/--/O POS Performed at [facility], [address] [place], [place] [address]  [phone]/21 1412)  Antibody: Negative (01/28 1516)  Rubella: <0.90 (01/28 1516)  Varicella: 194 (01/28 1516)  RPR: Non Reactive (05/02 0954)   HBsAg: Negative (01/28 1516)   HIV: Non Reactive (01/28 1516)   GBS: Positive (05/02 1032)  1 hr Glucola: 78 (05/02 0954)  Genetic screening: Low risk female (01/28 1514)  Anatomy US: Complete, normal (03/18 1430)  Past Medical History:  Diagnosis Date   Anxiety    History of anemia    History of asthma    History of urinary tract infection     Past Surgical History:  Procedure Laterality Date   EYE SURGERY Left 2007   "remove clot"   ROOT CANAL     x1    OB History  Gravida Para Term Preterm AB Living  4 1 1  0 2 1  SAB IAB Ectopic Multiple Live Births  2 0 0 0 1    # Outcome Date GA Lbr Len/2nd Weight Sex Delivery Anes PTL Lv  4 Current           3 SAB 05/2020          2 SAB 2012          1 Term 04/08/10 [redacted]w[redacted]d  2608 g M    LIV     Birth Comments: [facility]    Social History   Socioeconomic History   Marital status: Single    Spouse name: Not on file   Number of children: 1   Years of education: 13   Highest education level: High school graduate  Occupational  History   Not on file  Tobacco Use   Smoking status: Never   Smokeless tobacco: Never  Vaping Use   Vaping Use: Never used  Substance and Sexual Activity   Alcohol use: Not Currently    Comment: last use - 07/01/2020   Drug use: Not Currently    Types: Cocaine    Comment: last used cocaine-  first week 07/2020   Sexual activity: Yes    Partners: Male    Birth control/protection: Implant    Comment: Nexplanon removed 10/2019, heavy bleeding  Other Topics Concern   Not on file  Social History Narrative   Not on file   Social Determinants of Health   Financial Resource Strain: Not on file  Food Insecurity: Not on file  Transportation Needs: Not on file  Physical Activity: Not on file  Stress: Not on file  Social Connections: Not on file    Family History  Problem Relation Age of Onset   Breast cancer Mother    Diabetes Mother    Diabetes Father     Medications Prior to Admission  Medication Sig Dispense Refill Last Dose   albuterol (VENTOLIN HFA) 108 (90 Base)  MCG/ACT inhaler Inhale 2 puffs into the lungs every 6 (six) hours as needed for wheezing or shortness of breath. 8 g 2    hydrocortisone (ANUSOL-HC) 25 MG suppository Place 1 suppository (25 mg total) rectally 2 (two) times daily. 12 suppository 0    Prenat-FeAsp-Meth-FA-DHA w/o A (PRENATE PIXIE) 10-0.6-0.4-200 MG CAPS Take 200 mg by mouth daily. 30 capsule 11    Prenatal 27-1 MG TABS Take by mouth.       No Known Allergies  Review of Systems: Negative except for what is mentioned in HPI.  Physical Exam:  Temp:  [97.5 F (36.4 C)-98.4 F (36.9 C)] 97.5 F (36.4 C) (07/14 0954) Pulse Rate:  [72-84] 72 (07/14 0954) Resp:  [16-18] 18 (07/14 0954) BP: (107-108)/(59-62) 107/59 (07/14 0954) SpO2:  [99 %] 99 % (07/14 0954) Weight:  [68.9 kg] 68.9 kg (07/14 0954)  GENERAL: Well-developed, well-nourished female in no acute  distress.   LUNGS: Clear to auscultation bilaterally.   HEART: Regular rate and  rhythm.  ABDOMEN: Soft, nontender, nondistended, gravid.  EXTREMITIES: Nontender, no edema, 2+ distal pulses.  Cervical Exam: Dilation: 5 Effacement (%): 90 Cervical Position: Middle Station: 0 Presentation: Vertex  FHR Category I  Contractions: Every one (1) to two (2) minutes; soft resting tone   Pertinent Labs/Studies:   No results found for this or any previous visit (from the past 24 hour(s)).  Assessment :  Natasha Sloan is a 30 y.o. [id number] at [redacted]w[redacted]d being admitted for labor, Rh positive, GBS negative   FHR Category I  Plan:  Admit to birthing suites, see orders.   Labor: Expectant management.  Induction/Augmentation as needed, per protocol.  Plan: Hopeful for vaginal birth.   Dr. Valentino Saxon notified of admission and plan of care.    Gunnar Bulla, CNM [facility], [facility] 03/13/21 10:00 AM

## 2021-03-13 NOTE — Progress Notes (Signed)
Patient ID: Natasha Sloan, female   DOB: [birth year], 30 y.o.   MRN: [id number]  Natasha Sloan is a 30 y.o. [id number] at [redacted]w[redacted]d by ultrasound admitted for active labor  Subjective:  Patient resting in bed, reports pain relief since epidural placement.   Patient's mother at bedside for continuous labor support.   Denies difficulty breathing or respiratory distress, chest pain, abdominal pain, excessive vaginal bleeding, dysuria, and leg pain or swelling.   Objective:  Temp:  [97.5 F (36.4 C)-98.4 F (36.9 C)] 97.5 F (36.4 C) (07/14 0954) Pulse Rate:  [72-84] 72 (07/14 0954) Resp:  [16-18] 18 (07/14 0954) BP: (107-108)/(59-62) 107/59 (07/14 0954) SpO2:  [99 %] 99 % (07/14 0954) Weight:  [68.9 kg] 68.9 kg (07/14 0954)  Fetal Wellbeing:  Category I  UC:   regular, every two (2) to four (4) minutes; soft resting tone  SVE:   Dilation: 6 Effacement (%): 100 Station: Plus 1 Exam by:: Willodean Rosenthal, CNM  Labs: Lab Results  Component Value Date   WBC 11.9 (H) 03/13/2021   HGB 13.2 03/13/2021   HCT 37.7 03/13/2021   MCV 83.6 03/13/2021   PLT 205 03/13/2021    Assessment:  Natasha Sloan is a 30 y.o. [id number] at [redacted]w[redacted]d admitted for labor, Rh positive, GBS negative  FHR Category I  Plan:  Encouraged position change and use of peanut ball.   Next dose of antibiotics due at 1415, will start at 1345.   Reviewed red flag symptoms and when to call.   Continue orders as written.     Serafina Royals, CNM [facility], [facility] 03/13/2021, 12:37 PM

## 2021-03-13 NOTE — Progress Notes (Signed)
Patient ID: Natasha Sloan, female   DOB: 1990-11-25, 30 y.o.   MRN: [id number]  Natasha Sloan is a 30 y.o. [id number] at [redacted]w[redacted]d by ultrasound admitted for active labor  Subjective:  Patient reports intermittent nausea, resolving with medications.   Patient's mother at bedside for continuous labor support.   Denies difficulty breathing or respiratory distress, chest pain, abdominal pain, dysuria, and leg pain or swelling.   Objective:  Temp:  [97.5 F (36.4 C)-98.4 F (36.9 C)] 98.2 F (36.8 C) (07/14 1919) Pulse Rate:  [68-100] 97 (07/14 1950) Resp:  [16-18] 16 (07/14 1919) BP: (96-113)/(54-81) 102/63 (07/14 1950) SpO2:  [99 %] 99 % (07/14 1625) Weight:  [68.9 kg] 68.9 kg (07/14 0954)  Fetal Wellbeing:  Category I  UC:   regular, every two (2) to four (4) minutes; soft resting tone  SVE:  Unchanged since previous exam; AROM clear fluid, moderate amount  Labs: Lab Results  Component Value Date   WBC 11.9 (H) 03/13/2021   HGB 13.2 03/13/2021   HCT 37.7 03/13/2021   MCV 83.6 03/13/2021   PLT 205 03/13/2021    Assessment:  Natasha Sloan is a 30 y.o. [id number] at [redacted]w[redacted]d admitted for labor, Rh positive, GBS negative   FHR Category I  Plan:  Consent obtained from patient for artifical rupture of membranes, completed successfully without difficulty.   Encouraged position change and use of peanut ball.   Reviewed red flag symptoms and when to call.   Continue orders as written.    Natasha Sloan, CNM Natasha Sloan, Natasha Sloan 03/13/2021, 8:20 PM

## 2021-03-13 NOTE — Anesthesia Preprocedure Evaluation (Signed)
Anesthesia Evaluation  Patient identified by MRN, date of birth, ID band Patient awake    Reviewed: Allergy & Precautions, H&P , NPO status , Patient's Chart, lab work & pertinent test results  Airway Mallampati: II  TM Distance: >3 FB Neck ROM: full    Dental no notable dental hx. (+) Teeth Intact   Pulmonary asthma ,    Pulmonary exam normal        Cardiovascular Normal cardiovascular exam     Neuro/Psych PSYCHIATRIC DISORDERS Anxiety negative neurological ROS     GI/Hepatic negative GI ROS, Neg liver ROS,   Endo/Other  negative endocrine ROS  Renal/GU negative Renal ROS  negative genitourinary   Musculoskeletal   Abdominal   Peds  Hematology negative hematology ROS (+)   Anesthesia Other Findings   Reproductive/Obstetrics (+) Pregnancy                             Anesthesia Physical Anesthesia Plan  ASA: 2  Anesthesia Plan: Epidural   Post-op Pain Management:    Induction:   PONV Risk Score and Plan:   Airway Management Planned:   Additional Equipment:   Intra-op Plan:   Post-operative Plan:   Informed Consent: I have reviewed the patients History and Physical, chart, labs and discussed the procedure including the risks, benefits and alternatives for the proposed anesthesia with the patient or authorized representative who has indicated his/her understanding and acceptance.     Dental Advisory Given  Plan Discussed with: Anesthesiologist and CRNA  Anesthesia Plan Comments:         Anesthesia Quick Evaluation

## 2021-03-13 NOTE — Anesthesia Procedure Notes (Signed)
Epidural Patient location during procedure: OB Start time: 03/13/2021 10:36 AM End time: 03/13/2021 10:40 AM  Staffing Anesthesiologist: Piscitello, Cleda Mccreedy, MD Resident/CRNA: Karoline Caldwell, CRNA Performed: resident/CRNA   Preanesthetic Checklist Completed: patient identified, IV checked, site marked, risks and benefits discussed, surgical consent, monitors and equipment checked, pre-op evaluation and timeout performed  Epidural Patient position: sitting Prep: ChloraPrep Patient monitoring: heart rate, continuous pulse ox and blood pressure Approach: midline Location: L3-L4 Injection technique: LOR air  Needle:  Needle type: Tuohy  Needle gauge: 17 G Needle length: 9 cm and 9 Needle insertion depth: 6 cm Catheter type: closed end flexible Catheter size: 19 Gauge Catheter at skin depth: 11 cm Test dose: negative and 1.5% lidocaine with Epi 1:200 K  Assessment Sensory level: T10 Events: blood not aspirated, injection not painful, no injection resistance, no paresthesia and negative IV test  Additional Notes 1 attempt Pt. Evaluated and documentation done after procedure finished. Patient identified. Risks/Benefits/Options discussed with patient including but not limited to bleeding, infection, nerve damage, paralysis, failed block, incomplete pain control, headache, blood pressure changes, nausea, vomiting, reactions to medication both or allergic, itching and postpartum back pain. Confirmed with bedside nurse the patient's most recent platelet count. Confirmed with patient that they are not currently taking any anticoagulation, have any bleeding history or any family history of bleeding disorders. Patient expressed understanding and wished to proceed. All questions were answered. Sterile technique was used throughout the entire procedure. Please see nursing notes for vital signs. Test dose was given through epidural catheter and negative prior to continuing to dose epidural or start  infusion. Warning signs of high block given to the patient including shortness of breath, tingling/numbness in hands, complete motor block, or any concerning symptoms with instructions to call for help. Patient was given instructions on fall risk and not to get out of bed. All questions and concerns addressed with instructions to call with any issues or inadequate analgesia.    Patient tolerated the insertion well without immediate complications.Reason for block:procedure for pain

## 2021-03-14 LAB — CBC
HCT: 34.3 % — ABNORMAL LOW (ref 36.0–46.0)
Hemoglobin: 11.7 g/dL — ABNORMAL LOW (ref 12.0–15.0)
MCH: 29.6 pg (ref 26.0–34.0)
MCHC: 34.1 g/dL (ref 30.0–36.0)
MCV: 86.8 fL (ref 80.0–100.0)
Platelets: 176 10*3/uL (ref 150–400)
RBC: 3.95 MIL/uL (ref 3.87–5.11)
RDW: 13.6 % (ref 11.5–15.5)
WBC: 12.9 10*3/uL — ABNORMAL HIGH (ref 4.0–10.5)
nRBC: 0 % (ref 0.0–0.2)

## 2021-03-14 LAB — RPR: RPR Ser Ql: NONREACTIVE

## 2021-03-14 MED ORDER — IBUPROFEN 600 MG PO TABS: 600.0000 mg | ORAL_TABLET | Freq: Four times a day (QID) | ORAL | 0 refills | Status: DC

## 2021-03-14 MED ORDER — MEASLES, MUMPS & RUBELLA VAC IJ SOLR
0.5000 mL | Freq: Once | INTRAMUSCULAR | Status: AC
  Administered 2021-03-14: 0.5 mL via SUBCUTANEOUS
  Filled 2021-03-14: qty 0.5

## 2021-03-14 MED ORDER — DOCUSATE SODIUM 100 MG PO CAPS: 100.0000 mg | ORAL_CAPSULE | Freq: Two times a day (BID) | ORAL | 0 refills | Status: DC | PRN

## 2021-03-14 MED ORDER — DOCUSATE SODIUM 100 MG PO CAPS: 100.0000 mg | ORAL_CAPSULE | Freq: Two times a day (BID) | ORAL | Status: DC | PRN

## 2021-03-14 MED ORDER — WITCH HAZEL-GLYCERIN EX PADS: 1.0000 "application " | MEDICATED_PAD | CUTANEOUS | 12 refills | Status: DC | PRN

## 2021-03-14 MED ORDER — ACETAMINOPHEN 500 MG PO TABS: 1000.0000 mg | ORAL_TABLET | Freq: Four times a day (QID) | ORAL | 0 refills | Status: DC | PRN

## 2021-03-14 MED ORDER — DIBUCAINE (PERIANAL) 1 % EX OINT: 1.0000 "application " | TOPICAL_OINTMENT | CUTANEOUS | 1 refills | Status: DC | PRN

## 2021-03-14 MED ORDER — SENNOSIDES-DOCUSATE SODIUM 8.6-50 MG PO TABS: 2.0000 | ORAL_TABLET | ORAL | 0 refills | Status: DC

## 2021-03-14 NOTE — Discharge Summary (Signed)
Physician Obstetric Discharge Summary  Patient ID: Natasha Sloan MRN: [id number] DOB/AGE: [birth year] 30 y.o.   Date of Admission: 03/13/2021  Date of Discharge:   Admitting Diagnosis: Onset of Labor at [redacted]w[redacted]d  Secondary Diagnosis:   Patient Active Problem List   Diagnosis Date Noted   Normal labor 03/13/2021   Vaginal delivery    [redacted] weeks gestation of pregnancy    Labor and delivery, indication for care 03/12/2021   History of postpartum hemorrhage 02/27/2021   Hx of maternal laceration, sulcus tear, currently pregnant, third trimester 02/27/2021   GBS bacteriuria 01/03/2021   Spotting affecting pregnancy in third trimester 12/30/2020   Rubella non-immune status, antepartum 09/30/2020   Late prenatal care 09/27/2020   History of chlamydia 09/14/2020   Vaginal bleeding in pregnancy 09/14/2020   Type O blood, Rh positive 09/13/2020   R/O Posttraumatic stress disorder 03/29/2018   Asthma 09/09/2011    Mode of Delivery: normal spontaneous vaginal delivery     Discharge Diagnosis: No other diagnosis   Intrapartum Procedures: Atificial rupture of membranes, GBS prophylaxis, and pitocin augmentation   Post partum procedures:  None  Complications:  None   Brief Hospital Course   Natasha Sloan is a [id number] who had a spontaneous vaginal birth on 03/13/2021;  for further details of this birth, please refer to the delivey summary.  Patient had an uncomplicated postpartum course.  By time of discharge on PPD#1, her pain was controlled on oral pain medications; she had appropriate lochia and was ambulating, voiding without difficulty and tolerating regular diet.  She was deemed stable for discharge to home.    Labs: CBC Latest Ref Rng & Units 03/14/2021 03/13/2021 12/30/2020  WBC 4.0 - 10.5 K/uL 12.9(H) 11.9(H) 6.3  Hemoglobin 12.0 - 15.0 g/dL 11.7(L) 13.2 11.4  Hematocrit 36.0 - 46.0 % 34.3(L) 37.7 33.9(L)  Platelets 150 - 400 K/uL 176 205 226   O POS  Physical exam:   Temp:   [97.6 F (36.4 C)-98.7 F (37.1 C)] 98.7 F (37.1 C) (07/15 0841) Pulse Rate:  [70-97] 75 (07/15 0841) Resp:  [16-20] 18 (07/15 0841) BP: (93-119)/(56-81) 93/56 (07/15 0841) SpO2:  [98 %-100 %] 99 % (07/15 0841)  General: alert and no distress  Lochia: appropriate  Abdomen: soft, NT  Uterine Fundus: firm  Perineum: healing well, no significant drainage, no dehiscence, no significant erythema  Extremities: No evidence of DVT seen on physical exam. No lower extremity edema.  Edinburgh Postnatal Depression Scale Screening Tool 03/13/2021  I have been able to laugh and see the funny side of things. 0  I have looked forward with enjoyment to things. 0  I have blamed myself unnecessarily when things went wrong. 1  I have been anxious or worried for no good reason. 0  I have felt scared or panicky for no good reason. 0  Things have been getting on top of me. 0  I have been so unhappy that I have had difficulty sleeping. 0  I have felt sad or miserable. 0  I have been so unhappy that I have been crying. 0  The thought of harming myself has occurred to me. 0  Edinburgh Postnatal Depression Scale Total 1     Discharge Instructions: Per After Visit Summary.  Activity: Advance as tolerated. Pelvic rest for 6 weeks.  Also refer to After Visit Summary  Diet: Regular  Medications: Allergies as of 03/14/2021   No Known Allergies      Medication List  STOP taking these medications    hydrocortisone 25 MG suppository Commonly known as: ANUSOL-HC   metroNIDAZOLE 250 MG tablet Commonly known as: FLAGYL       TAKE these medications    acetaminophen 500 MG tablet Commonly known as: TYLENOL Take 2 tablets (1,000 mg total) by mouth every 6 (six) hours as needed (for pain scale < 4).   albuterol 108 (90 Base) MCG/ACT inhaler Commonly known as: VENTOLIN HFA Inhale 2 puffs into the lungs every 6 (six) hours as needed for wheezing or shortness of breath.   dibucaine 1 %  Oint Commonly known as: NUPERCAINAL Place 1 application rectally as needed for hemorrhoids.   docusate sodium 100 MG capsule Commonly known as: COLACE Take 1 capsule (100 mg total) by mouth 2 (two) times daily as needed for mild constipation.   ibuprofen 600 MG tablet Commonly known as: ADVIL Take 1 tablet (600 mg total) by mouth every 6 (six) hours.   Prenatal 27-1 MG Tabs Take by mouth.   Prenate Pixie 10-0.6-0.4-200 MG Caps Take 200 mg by mouth daily.   senna-docusate 8.6-50 MG tablet Commonly known as: Senokot-S Take 2 tablets by mouth daily.   witch hazel-glycerin pad Commonly known as: TUCKS Apply 1 application topically as needed for hemorrhoids.               Discharge Care Instructions  (From admission, onward)           Start     Ordered   03/14/21 0000  Discharge wound care:       Comments: See AVS   03/14/21 1413           Outpatient follow up:   Follow-up Information     [facility] Follow up.   Why: Someone from the office will call you to schedule a two (2) TELEVISIT and six (6) POSTPARTUM VISIT or sooner if needed Contact information: [address]  [address] [place] [address] [phone]               Postpartum contraception:  Nexplanon  Discharged Condition: stable  Discharged to: home   Newborn Data:  Disposition:home with mother  Apgars: APGAR (1 MIN): 9   APGAR (5 MINS): 9    Baby Feeding: Bottle and Breast   Serafina Royals, CNM  [facility], [facility] 03/14/21 2:14 PM

## 2021-03-14 NOTE — Progress Notes (Signed)
All doctors discharge instructions reviewed with patient; patient verbalized understanding of same; copy given.

## 2021-03-14 NOTE — Discharge Instructions (Signed)

## 2021-03-14 NOTE — Lactation Note (Signed)
This note was copied from a baby's chart. Lactation Consultation Note  Patient Name: Natasha Sloan IRCVE'L Date: 03/14/2021 Reason for consult: Initial assessment;Term Age:28 hours  Maternal Data Has patient been taught Hand Expression?: Yes Does the patient have breastfeeding experience prior to this delivery?: Yes How long did the patient breastfeed?: 6 mths  Feeding Mother's Current Feeding Choice: Breast Milk and Formula Baby had recently breastfed on right breast, spit up mucous, mom reassured this was normal, latched to left breast easily once mom shown how to sandwich breast to get deep latch, cradle hold LATCH Score Latch: Grasps breast easily, tongue down, lips flanged, rhythmical sucking.  Audible Swallowing: A few with stimulation  Type of Nipple: Everted at rest and after stimulation  Comfort (Breast/Nipple): Soft / non-tender  Hold (Positioning): Assistance needed to correctly position infant at breast and maintain latch.  LATCH Score: 8   Lactation Tools Discussed/Used  LC name and no written on white board  Interventions Interventions: Breast feeding basics reviewed;Assisted with latch;Skin to skin;Hand express;Breast compression;Support pillows;Education;Hand pump  Discharge Pump: Manual (harmony pump given per mom's request) WIC Program: Yes  Consult Status Consult Status: PRN    Dyann Kief 03/14/2021, 2:59 PM

## 2021-03-14 NOTE — Anesthesia Postprocedure Evaluation (Signed)
Anesthesia Post Note  Patient: Natasha Sloan  Procedure(s) Performed: AN AD HOC LABOR EPIDURAL  Patient location during evaluation: Mother Baby Anesthesia Type: Epidural Level of consciousness: awake and alert Pain management: pain level controlled Vital Signs Assessment: post-procedure vital signs reviewed and stable Respiratory status: spontaneous breathing, nonlabored ventilation and respiratory function stable Cardiovascular status: stable Postop Assessment: no headache, no backache and epidural receding Anesthetic complications: no   No notable events documented.   Last Vitals:  Vitals:   03/13/21 2311 03/14/21 0248  BP: 104/71 119/63  Pulse: 70 82  Resp: 20 18  Temp: 37.1 C 36.4 C  SpO2: 99% 100%    Last Pain:  Vitals:   03/14/21 0330  TempSrc:   PainSc: 4                  Rosanne Gutting

## 2021-03-14 NOTE — Progress Notes (Signed)
Patient discharged to home with infant. Patient left 3rd floor via wheelchair holding infant in her arms accompanied by patient's mother and Brion Aliment NT.

## 2021-03-18 ENCOUNTER — Other Ambulatory Visit: Payer: Medicaid Other

## 2021-03-18 ENCOUNTER — Encounter: Payer: Medicaid Other | Admitting: Certified Nurse Midwife

## 2021-03-28 NOTE — Discharge Summary (Signed)
See Discharge summary.    Natasha Sloan, CNM [facility], [facility] 03/28/21 9:51 AM

## 2021-04-28 ENCOUNTER — Ambulatory Visit (INDEPENDENT_AMBULATORY_CARE_PROVIDER_SITE_OTHER): Payer: Medicaid Other | Admitting: Certified Nurse Midwife

## 2021-04-28 ENCOUNTER — Encounter: Payer: Self-pay | Admitting: Certified Nurse Midwife

## 2021-04-28 ENCOUNTER — Other Ambulatory Visit: Payer: Self-pay

## 2021-04-28 NOTE — Patient Instructions (Signed)
Preventive Care 33-28 Years Old, Female Preventive care refers to lifestyle choices and visits with your health care provider that can promote health and wellness. This includes: A yearly physical exam. This is also called an annual wellness visit. Regular dental and eye exams. Immunizations. Screening for certain conditions. Healthy lifestyle choices, such as: Eating a healthy diet. Getting regular exercise. Not using drugs or products that contain nicotine and tobacco. Limiting alcohol use. What can I expect for my preventive care visit? Physical exam Your health care provider may check your: Height and weight. These may be used to calculate your BMI (body mass index). BMI is a measurement that tells if you are at a healthy weight. Heart rate and blood pressure. Body temperature. Skin for abnormal spots. Counseling Your health care provider may ask you questions about your: Past medical problems. Family's medical history. Alcohol, tobacco, and drug use. Emotional well-being. Home life and relationship well-being. Sexual activity. Diet, exercise, and sleep habits. Work and work Statistician. Access to firearms. Method of birth control. Menstrual cycle. Pregnancy history. What immunizations do I need?  Vaccines are usually given at various ages, according to a schedule. Your health care provider will recommend vaccines for you based on your age, medicalhistory, and lifestyle or other factors, such as travel or where you work. What tests do I need?  Blood tests Lipid and cholesterol levels. These may be checked every 5 years starting at age 54. Hepatitis C test. Hepatitis B test. Screening Diabetes screening. This is done by checking your blood sugar (glucose) after you have not eaten for a while (fasting). STD (sexually transmitted disease) testing, if you are at risk. BRCA-related cancer screening. This may be done if you have a family history of breast, ovarian, tubal, or  peritoneal cancers. Pelvic exam and Pap test. This may be done every 3 years starting at age 36. Starting at age 52, this may be done every 5 years if you have a Pap test in combination with an HPV test. Talk with your health care provider about your test results, treatment options,and if necessary, the need for more tests. Follow these instructions at home: Eating and drinking  Eat a healthy diet that includes fresh fruits and vegetables, whole grains, lean protein, and low-fat dairy products. Take vitamin and mineral supplements as recommended by your health care provider. Do not drink alcohol if: Your health care provider tells you not to drink. You are pregnant, may be pregnant, or are planning to become pregnant. If you drink alcohol: Limit how much you have to 0-1 drink a day. Be aware of how much alcohol is in your drink. In the [place] one drink equals one 12 oz bottle of beer (355 mL), one 5 oz glass of wine (148 mL), or one 1 oz glass of hard liquor (44 mL).  Lifestyle Take daily care of your teeth and gums. Brush your teeth every morning and night with fluoride toothpaste. Floss one time each day. Stay active. Exercise for at least 30 minutes 5 or more days each week. Do not use any products that contain nicotine or tobacco, such as cigarettes, e-cigarettes, and chewing tobacco. If you need help quitting, ask your health care provider. Do not use drugs. If you are sexually active, practice safe sex. Use a condom or other form of protection to prevent STIs (sexually transmitted infections). If you do not wish to become pregnant, use a form of birth control. If you plan to become pregnant, see your health care  provider for a prepregnancy visit. Find healthy ways to cope with stress, such as: Meditation, yoga, or listening to music. Journaling. Talking to a trusted person. Spending time with friends and family. Safety Always wear your seat belt while driving or riding in a  vehicle. Do not drive: If you have been drinking alcohol. Do not ride with someone who has been drinking. When you are tired or distracted. While texting. Wear a helmet and other protective equipment during sports activities. If you have firearms in your house, make sure you follow all gun safety procedures. Seek help if you have been physically or sexually abused. What's next? Go to your health care provider once a year for an annual wellness visit. Ask your health care provider how often you should have your eyes and teeth checked. Stay up to date on all vaccines. This information is not intended to replace advice given to you by your health care provider. Make sure you discuss any questions you have with your healthcare provider. Document Revised: 04/14/2020 Document Reviewed: 04/28/2018 Elsevier Patient Education  2022 [employer].

## 2021-04-28 NOTE — Progress Notes (Signed)
Subjective:    Natasha Sloan is a 30 y.o. [phone] Hispanic female who presents for a postpartum visit. She is 6 weeks postpartum following a spontaneous vaginal delivery at 39.5 gestational weeks. Anesthesia: epidural. I have fully reviewed the prenatal and intrapartum course. Postpartum course has been normal. Baby's course has been normal. Baby is feeding by  breast and bottle . Bleeding no bleeding. Bowel function is normal. Bladder function is normal. Patient is sexually active. Last sexual activity: 2 days ago. Contraception method is condoms. Postpartum depression screening: negative. Score 0.  Last pap 03/18/2018 and was negative.  The following portions of the patient's history were reviewed and updated as appropriate: allergies, current medications, past medical history, past surgical history and problem list.  Review of Systems Pertinent items are noted in HPI.   Vitals:   04/28/21 1600  BP: 95/62  Pulse: 78  Weight: 138 lb 3.2 oz (62.7 kg)  Height: 5\' 2"  (1.575 m)   No LMP recorded.  Objective:   General:  alert, cooperative and no distress   Breasts:  deferred, no complaints  Lungs: clear to auscultation bilaterally  Heart:  regular rate and rhythm  Abdomen: soft, nontender   Vulva: normal  Vagina: normal vagina  Cervix:  closed  Corpus: Well-involuted  Adnexa:  Non-palpable  Rectal Exam: no hemorrhoids        Assessment:   Postpartum exam 6 wks s/p SVD Breast and bottle feeding Depression screening Contraception counseling   Plan:  : condoms, would nexplanon placed as soon as able.  Follow up in: 6 months for annual or earlier if needed  , CNM

## 2021-05-01 ENCOUNTER — Telehealth: Payer: Self-pay | Admitting: Certified Nurse Midwife

## 2021-05-01 NOTE — Telephone Encounter (Signed)
Patient states she has been feeling really tired fore about 3-4 day.  The feeling is very unusual and not normal for her.  She states that her right breast is very tender like she was hit or something.  She states she's very concern because this feeling is not normal...please advise

## 2021-05-01 NOTE — Telephone Encounter (Signed)
Spoke with patient who voiced symptoms were fatigue and sore breasts. Patient is still breastfeeding and that R breast is tender to the touch. Pt denies any discharge. Pt states that she will try a warm compress, tylenol prn, and massaging the area. Pt to call back if symptoms worsen.

## 2021-05-28 ENCOUNTER — Encounter: Payer: Medicaid Other | Admitting: Certified Nurse Midwife

## 2021-05-28 ENCOUNTER — Encounter: Payer: Self-pay | Admitting: Certified Nurse Midwife

## 2021-05-29 ENCOUNTER — Other Ambulatory Visit: Payer: Self-pay

## 2021-05-29 ENCOUNTER — Emergency Department: Payer: Medicaid Other

## 2021-05-29 ENCOUNTER — Inpatient Hospital Stay
Admission: EM | Admit: 2021-05-29 | Discharge: 2021-06-01 | DRG: 872 | Disposition: A | Discharge status: Home / Self Care | Payer: Medicaid Other | Attending: Internal Medicine | Admitting: Internal Medicine

## 2021-05-29 DIAGNOSIS — R6520 Severe sepsis without septic shock: Secondary | ICD-10-CM | POA: Diagnosis present

## 2021-05-29 DIAGNOSIS — R0602 Shortness of breath: Secondary | ICD-10-CM | POA: Diagnosis not present

## 2021-05-29 DIAGNOSIS — G4489 Other headache syndrome: Secondary | ICD-10-CM | POA: Diagnosis not present

## 2021-05-29 DIAGNOSIS — A4151 Sepsis due to Escherichia coli [E. coli]: Principal | ICD-10-CM | POA: Diagnosis present

## 2021-05-29 DIAGNOSIS — J45909 Unspecified asthma, uncomplicated: Secondary | ICD-10-CM | POA: Diagnosis present

## 2021-05-29 DIAGNOSIS — E876 Hypokalemia: Secondary | ICD-10-CM | POA: Diagnosis present

## 2021-05-29 DIAGNOSIS — N390 Urinary tract infection, site not specified: Secondary | ICD-10-CM | POA: Diagnosis not present

## 2021-05-29 DIAGNOSIS — I959 Hypotension, unspecified: Secondary | ICD-10-CM | POA: Diagnosis not present

## 2021-05-29 DIAGNOSIS — A419 Sepsis, unspecified organism: Secondary | ICD-10-CM | POA: Diagnosis present

## 2021-05-29 DIAGNOSIS — Z79899 Other long term (current) drug therapy: Secondary | ICD-10-CM

## 2021-05-29 DIAGNOSIS — Z23 Encounter for immunization: Secondary | ICD-10-CM

## 2021-05-29 DIAGNOSIS — Z833 Family history of diabetes mellitus: Secondary | ICD-10-CM

## 2021-05-29 DIAGNOSIS — Z20822 Contact with and (suspected) exposure to covid-19: Secondary | ICD-10-CM | POA: Diagnosis present

## 2021-05-29 DIAGNOSIS — K8020 Calculus of gallbladder without cholecystitis without obstruction: Secondary | ICD-10-CM | POA: Diagnosis present

## 2021-05-29 DIAGNOSIS — F419 Anxiety disorder, unspecified: Secondary | ICD-10-CM | POA: Diagnosis present

## 2021-05-29 DIAGNOSIS — R000 Tachycardia, unspecified: Secondary | ICD-10-CM | POA: Diagnosis not present

## 2021-05-29 DIAGNOSIS — E872 Acidosis, unspecified: Secondary | ICD-10-CM | POA: Diagnosis present

## 2021-05-29 DIAGNOSIS — R509 Fever, unspecified: Secondary | ICD-10-CM | POA: Diagnosis not present

## 2021-05-29 DIAGNOSIS — R7989 Other specified abnormal findings of blood chemistry: Secondary | ICD-10-CM | POA: Diagnosis present

## 2021-05-29 DIAGNOSIS — I248 Other forms of acute ischemic heart disease: Secondary | ICD-10-CM | POA: Diagnosis present

## 2021-05-29 DIAGNOSIS — Z803 Family history of malignant neoplasm of breast: Secondary | ICD-10-CM

## 2021-05-29 DIAGNOSIS — D649 Anemia, unspecified: Secondary | ICD-10-CM | POA: Diagnosis present

## 2021-05-29 DIAGNOSIS — R531 Weakness: Secondary | ICD-10-CM | POA: Diagnosis not present

## 2021-05-29 HISTORY — DX: Urinary tract infection, site not specified: N39.0

## 2021-05-29 LAB — URINE DRUG SCREEN, QUALITATIVE (ARMC ONLY)
Amphetamines, Ur Screen: NOT DETECTED
Barbiturates, Ur Screen: NOT DETECTED
Benzodiazepine, Ur Scrn: NOT DETECTED
Cannabinoid 50 Ng, Ur ~~LOC~~: NOT DETECTED
Cocaine Metabolite,Ur ~~LOC~~: NOT DETECTED
MDMA (Ecstasy)Ur Screen: NOT DETECTED
Methadone Scn, Ur: NOT DETECTED
Opiate, Ur Screen: NOT DETECTED
Phencyclidine (PCP) Ur S: NOT DETECTED
Tricyclic, Ur Screen: NOT DETECTED

## 2021-05-29 LAB — CBC WITH DIFFERENTIAL/PLATELET
Abs Immature Granulocytes: 0.05 10*3/uL (ref 0.00–0.07)
Basophils Absolute: 0 10*3/uL (ref 0.0–0.1)
Basophils Relative: 0 %
Eosinophils Absolute: 0 10*3/uL (ref 0.0–0.5)
Eosinophils Relative: 0 %
HCT: 37 % (ref 36.0–46.0)
Hemoglobin: 13.2 g/dL (ref 12.0–15.0)
Immature Granulocytes: 1 %
Lymphocytes Relative: 14 %
Lymphs Abs: 1 10*3/uL (ref 0.7–4.0)
MCH: 30.4 pg (ref 26.0–34.0)
MCHC: 35.7 g/dL (ref 30.0–36.0)
MCV: 85.3 fL (ref 80.0–100.0)
Monocytes Absolute: 0.2 10*3/uL (ref 0.1–1.0)
Monocytes Relative: 3 %
Neutro Abs: 5.6 10*3/uL (ref 1.7–7.7)
Neutrophils Relative %: 82 %
Platelets: 188 10*3/uL (ref 150–400)
RBC: 4.34 MIL/uL (ref 3.87–5.11)
RDW: 13.2 % (ref 11.5–15.5)
WBC: 6.9 10*3/uL (ref 4.0–10.5)
nRBC: 0 % (ref 0.0–0.2)

## 2021-05-29 LAB — COMPREHENSIVE METABOLIC PANEL
ALT: 109 U/L — ABNORMAL HIGH (ref 0–44)
AST: 154 U/L — ABNORMAL HIGH (ref 15–41)
Albumin: 4.1 g/dL (ref 3.5–5.0)
Alkaline Phosphatase: 139 U/L — ABNORMAL HIGH (ref 38–126)
Anion gap: 11 (ref 5–15)
BUN: 13 mg/dL (ref 6–20)
CO2: 24 mmol/L (ref 22–32)
Calcium: 8.9 mg/dL (ref 8.9–10.3)
Chloride: 103 mmol/L (ref 98–111)
Creatinine, Ser: 0.94 mg/dL (ref 0.44–1.00)
GFR, Estimated: >60 mL/min (ref >60)
Glucose, Bld: 119 mg/dL — ABNORMAL HIGH (ref 70–99)
Potassium: 2.9 mmol/L — ABNORMAL LOW (ref 3.5–5.1)
Sodium: 138 mmol/L (ref 135–145)
Total Bilirubin: 1.2 mg/dL (ref 0.3–1.2)
Total Protein: 7.3 g/dL (ref 6.5–8.1)

## 2021-05-29 LAB — URINALYSIS, COMPLETE (UACMP) WITH MICROSCOPIC
Bilirubin Urine: NEGATIVE
Glucose, UA: NEGATIVE mg/dL
Ketones, ur: NEGATIVE mg/dL
Nitrite: POSITIVE — AB
Protein, ur: 30 mg/dL — AB
Specific Gravity, Urine: 1.01 (ref 1.005–1.030)
Squamous Epithelial / HPF: NONE SEEN (ref 0–5)
pH: 6 (ref 5.0–8.0)

## 2021-05-29 LAB — RESP PANEL BY RT-PCR (FLU A&B, COVID) ARPGX2
Influenza A by PCR: NEGATIVE
Influenza B by PCR: NEGATIVE
SARS Coronavirus 2 by RT PCR: NEGATIVE

## 2021-05-29 LAB — TROPONIN I (HIGH SENSITIVITY)
Troponin I (High Sensitivity): 31 ng/L — ABNORMAL HIGH (ref <18)
Troponin I (High Sensitivity): 61 ng/L — ABNORMAL HIGH (ref <18)

## 2021-05-29 LAB — LACTIC ACID, PLASMA
Lactic Acid, Venous: 2.4 mmol/L (ref 0.5–1.9)
Lactic Acid, Venous: 2 mmol/L (ref 0.5–1.9)

## 2021-05-29 LAB — MAGNESIUM: Magnesium: 1.6 mg/dL — ABNORMAL LOW (ref 1.7–2.4)

## 2021-05-29 LAB — POC URINE PREG, ED: Preg Test, Ur: NEGATIVE

## 2021-05-29 LAB — PROCALCITONIN: Procalcitonin: 0.46 ng/mL

## 2021-05-29 MED ORDER — VANCOMYCIN HCL 1500 MG/300ML IV SOLN
1500.0000 mg | Freq: Once | INTRAVENOUS | Status: AC
  Administered 2021-05-29: 1500 mg via INTRAVENOUS
  Filled 2021-05-29: qty 300

## 2021-05-29 MED ORDER — METRONIDAZOLE 500 MG/100ML IV SOLN
500.0000 mg | Freq: Once | INTRAVENOUS | Status: AC
  Administered 2021-05-29: 500 mg via INTRAVENOUS
  Filled 2021-05-29: qty 100

## 2021-05-29 MED ORDER — INFLUENZA VAC SPLIT QUAD 0.5 ML IM SUSY
0.5000 mL | PREFILLED_SYRINGE | INTRAMUSCULAR | Status: AC
  Administered 2021-05-30: 13:00:00 0.5 mL via INTRAMUSCULAR
  Filled 2021-05-29: qty 0.5

## 2021-05-29 MED ORDER — POTASSIUM CHLORIDE CRYS ER 20 MEQ PO TBCR
40.0000 meq | EXTENDED_RELEASE_TABLET | Freq: Once | ORAL | Status: AC
  Administered 2021-05-29: 40 meq via ORAL
  Filled 2021-05-29: qty 2

## 2021-05-29 MED ORDER — IBUPROFEN 400 MG PO TABS
400.0000 mg | ORAL_TABLET | Freq: Four times a day (QID) | ORAL | Status: DC | PRN
  Administered 2021-05-29 – 2021-05-30 (×2): 400 mg via ORAL
  Filled 2021-05-29 (×2): qty 1

## 2021-05-29 MED ORDER — ALBUTEROL SULFATE (2.5 MG/3ML) 0.083% IN NEBU
3.0000 mL | INHALATION_SOLUTION | Freq: Four times a day (QID) | RESPIRATORY_TRACT | Status: DC | PRN
  Administered 2021-05-30: 10:00:00 3 mL via RESPIRATORY_TRACT
  Filled 2021-05-29: qty 3

## 2021-05-29 MED ORDER — SENNOSIDES-DOCUSATE SODIUM 8.6-50 MG PO TABS
1.0000 | ORAL_TABLET | Freq: Every evening | ORAL | Status: DC | PRN
  Administered 2021-05-31: 1 via ORAL
  Filled 2021-05-29 (×2): qty 1

## 2021-05-29 MED ORDER — ONDANSETRON HCL 4 MG PO TABS: 4.0000 mg | ORAL_TABLET | Freq: Four times a day (QID) | ORAL | Status: DC | PRN

## 2021-05-29 MED ORDER — LACTATED RINGERS IV BOLUS
1000.0000 mL | Freq: Once | INTRAVENOUS | Status: AC
  Administered 2021-05-29: 1000 mL via INTRAVENOUS

## 2021-05-29 MED ORDER — LACTATED RINGERS IV SOLN: INTRAVENOUS | Status: AC

## 2021-05-29 MED ORDER — POTASSIUM CHLORIDE CRYS ER 20 MEQ PO TBCR
40.0000 meq | EXTENDED_RELEASE_TABLET | Freq: Once | ORAL | Status: AC
  Administered 2021-05-29: 23:00:00 40 meq via ORAL
  Filled 2021-05-29: qty 2

## 2021-05-29 MED ORDER — SODIUM CHLORIDE 0.9 % IV SOLN
2.0000 g | Freq: Once | INTRAVENOUS | Status: AC
  Administered 2021-05-29: 2 g via INTRAVENOUS
  Filled 2021-05-29: qty 2

## 2021-05-29 MED ORDER — SODIUM CHLORIDE 0.9 % IV SOLN
2.0000 g | INTRAVENOUS | Status: DC
  Administered 2021-05-30 – 2021-06-01 (×3): 2 g via INTRAVENOUS
  Filled 2021-05-29 (×2): qty 20
  Filled 2021-05-29: qty 2

## 2021-05-29 MED ORDER — ENOXAPARIN SODIUM 40 MG/0.4ML IJ SOSY
40.0000 mg | PREFILLED_SYRINGE | INTRAMUSCULAR | Status: DC
  Administered 2021-05-29 – 2021-05-31 (×3): 40 mg via SUBCUTANEOUS
  Filled 2021-05-29 (×3): qty 0.4

## 2021-05-29 MED ORDER — PNEUMOCOCCAL VAC POLYVALENT 25 MCG/0.5ML IJ INJ
0.5000 mL | INJECTION | INTRAMUSCULAR | Status: AC
  Administered 2021-05-30: 13:00:00 0.5 mL via INTRAMUSCULAR
  Filled 2021-05-29: qty 0.5

## 2021-05-29 MED ORDER — ACETAMINOPHEN 500 MG PO TABS
1000.0000 mg | ORAL_TABLET | Freq: Once | ORAL | Status: AC
  Administered 2021-05-29: 1000 mg via ORAL
  Filled 2021-05-29: qty 2

## 2021-05-29 MED ORDER — MAGNESIUM SULFATE 2 GM/50ML IV SOLN
2.0000 g | Freq: Once | INTRAVENOUS | Status: AC
  Administered 2021-05-29: 2 g via INTRAVENOUS
  Filled 2021-05-29: qty 50

## 2021-05-29 MED ORDER — VANCOMYCIN HCL IN DEXTROSE 1-5 GM/200ML-% IV SOLN: 1000.0000 mg | Freq: Once | INTRAVENOUS | Status: DC

## 2021-05-29 MED ORDER — ONDANSETRON HCL 4 MG/2ML IJ SOLN: 4.0000 mg | Freq: Four times a day (QID) | INTRAMUSCULAR | Status: DC | PRN

## 2021-05-29 NOTE — Sepsis Progress Note (Signed)
Elink tracking Code Sepsis.  

## 2021-05-29 NOTE — Progress Notes (Signed)
CODE SEPSIS - PHARMACY COMMUNICATION  **Broad Spectrum Antibiotics should be administered within 1 hour of Sepsis diagnosis**  Time Code Sepsis Called/Page Received: 1756  Antibiotics Ordered: cefepime, vancomycin, metronidazole  Time of 1st antibiotic administration: 1855  Additional action taken by pharmacy: none required  If necessary, Name of Provider/Nurse Contacted: N/A    Lowella Bandy ,PharmD Clinical Pharmacist  05/29/2021  5:50 PM

## 2021-05-29 NOTE — Progress Notes (Signed)
PHARMACY -  BRIEF ANTIBIOTIC NOTE   Pharmacy has received consult(s) for cefepime and vancomycin from an ED provider.  The patient's profile has been reviewed for ht/wt/allergies/indication/available labs.    One time order(s) placed for   1) cefepime 2 grams IV x 1  2) vancomycin 1500 mg IV x 1  Further antibiotics/pharmacy consults should be ordered by admitting physician if indicated.                       Thank you,  Lowella Bandy 05/29/2021  5:51 PM

## 2021-05-29 NOTE — Sepsis Progress Note (Signed)
Notified bedside nurse of need to administer antibiotics.  

## 2021-05-29 NOTE — H&P (Signed)
History and Physical    Natasha Sloan VQQ:[id number] DOB: Jul 23, 1991 DOA: 05/29/2021  PCP: Gunnar Bulla, CNM  Patient coming from: Home via EMS  I have personally briefly reviewed patient's old medical records in [facility]  Chief Complaint: Dysuria, fever  HPI: Natasha Sloan is a 30 y.o. female with medical history significant for asthma, anxiety who presented to the ED for evaluation of dysuria, fever.  Patient reports 3-4 days of dysuria.  Yesterday she developed chills, fevers, diaphoresis, headache, nausea with one episode of emesis.  Today she began to have palpitations and felt short of breath with chest tightness.  She used her albuterol inhaler with some relief.  She has been having some mild bilateral flank discomfort but otherwise denies any abdominal pain.  She denies any diarrhea.  She is not taking any medications regularly other than albuterol as needed.  She is approximately 12 weeks postpartum.  ED Course:  Initial vitals showed BP 103/73, pulse 107, RR 20, temp 102.1 F, SPO2 99% on room air.  Labs show WBC 6.9, hemoglobin 13.2, platelets 188,000, sodium 138, potassium 2.9, magnesium 1.6, bicarb 24, BUN 13, creatinine 0.94, serum glucose 119, AST 154, ALT 109, alkaline phosphatase 139, total bilirubin 1.2.  High-sensitivity troponin 31.  Lactic acid 2.4 > 2.0.  Procalcitonin 0.46.  Urinalysis shows positive nitrates, small leukocytes, 11-20 WBC/hpf, 0-5 RBC/hpf, many bacteria on microscopy.  Blood cultures obtained and pending.  UDS negative.  POC urine pregnancy test negative.  SARS-CoV-2 and influenza PCR's are negative.  2 view chest x-ray is negative for focal consolidation, edema, or effusion.  Patient was given 2 L LR, 2 g IV magnesium, 40 mEq oral K, 1 g Tylenol, and started on broad-spectrum antibiotics with IV vancomycin, cefepime, and Flagyl.  The hospitalist service was consulted to admit for further evaluation and management.  Review of  Systems: All systems reviewed and are negative except as documented in history of present illness above.   Past Medical History:  Diagnosis Date   Anxiety    Asthma    History of anemia    History of asthma    History of urinary tract infection     Past Surgical History:  Procedure Laterality Date   EYE SURGERY Left 2007   "remove clot"   ROOT CANAL     x1    Social History:  reports that she has never smoked. She has never used smokeless tobacco. She reports that she does not currently use alcohol. She reports that she does not currently use drugs after having used the following drugs: Cocaine.  No Known Allergies  Family History  Problem Relation Age of Onset   Breast cancer Mother    Diabetes Mother    Diabetes Father      Prior to Admission medications   Medication Sig Start Date End Date Taking? Authorizing Provider  acetaminophen (TYLENOL) 500 MG tablet Take 2 tablets (1,000 mg total) by mouth every 6 (six) hours as needed (for pain scale < 4). 03/14/21   Lawhorn, Vanessa [place], CNM  albuterol (VENTOLIN HFA) 108 (90 Base) MCG/ACT inhaler Inhale 2 puffs into the lungs every 6 (six) hours as needed for wheezing or shortness of breath. 10/23/20   Doreene Burke, CNM  dibucaine (NUPERCAINAL) 1 % OINT Place 1 application rectally as needed for hemorrhoids. 03/14/21   Lawhorn, Vanessa [place], CNM  docusate sodium (COLACE) 100 MG capsule Take 1 capsule (100 mg total) by mouth 2 (two) times daily as needed  for mild constipation. 03/14/21   Lawhorn, Vanessa [place], CNM  ibuprofen (ADVIL) 600 MG tablet Take 1 tablet (600 mg total) by mouth every 6 (six) hours. 03/14/21   Lawhorn, Vanessa [place], CNM  Prenat-FeAsp-Meth-FA-DHA w/o A (PRENATE PIXIE) 10-0.6-0.4-200 MG CAPS Take 200 mg by mouth daily. Patient not taking: No sig reported 01/17/21   Lawhorn, Vanessa [place], CNM  Prenatal 27-1 MG TABS Take by mouth. Patient not taking: No sig reported    [provider]   senna-docusate (SENOKOT-S) 8.6-50 MG tablet Take 2 tablets by mouth daily. 03/14/21   Lawhorn, Vanessa , CNM  witch hazel-glycerin (TUCKS) pad Apply 1 application topically as needed for hemorrhoids. 03/14/21   Gunnar Bulla, CNM    Physical Exam: Vitals:   05/29/21 1639 05/29/21 1700 05/29/21 1730 05/29/21 1800  BP: 105/71 103/73 98/67 98/66   Pulse: (!) 113 (!) 107 (!) 107 (!) 107  Resp: (!) 22 20 19 15   Temp:      TempSrc:      SpO2: 98% 99% 99% 96%  Weight:      Height:       Constitutional: Resting supine in bed, NAD, calm, comfortable Eyes: PERRL, lids and conjunctivae normal ENMT: Mucous membranes are dry. Posterior pharynx clear of any exudate or lesions.Normal dentition.  Neck: normal, supple, no masses. Respiratory: clear to auscultation bilaterally, no wheezing, no crackles. Normal respiratory effort. No accessory muscle use.  Cardiovascular: Tachycardic with regular rhythm, no murmurs / rubs / gallops. No extremity edema. 2+ pedal pulses. Abdomen: Mild left CVA tenderness otherwise no other abdominal tenderness. No hepatosplenomegaly. Bowel sounds positive.  Musculoskeletal: no clubbing / cyanosis. No joint deformity upper and lower extremities. Good ROM, no contractures. Normal muscle tone.  Skin: no rashes, lesions, ulcers. No induration Neurologic: CN 2-12 grossly intact. Sensation intact. Strength 5/5 in all 4.  Psychiatric: Normal judgment and insight. Alert and oriented x 3. Normal mood.   Labs on Admission: I have personally reviewed following labs and imaging studies  CBC: Recent Labs  Lab 05/29/21 1615  WBC 6.9  NEUTROABS 5.6  HGB 13.2  HCT 37.0  MCV 85.3  PLT 188   Basic Metabolic Panel: Recent Labs  Lab 05/29/21 1615  NA 138  K 2.9*  CL 103  CO2 24  GLUCOSE 119*  BUN 13  CREATININE 0.94  CALCIUM 8.9  MG 1.6*   GFR: Estimated Creatinine Clearance: 77.1 mL/min (by C-G formula based on SCr of 0.94 mg/dL). Liver Function  Tests: Recent Labs  Lab 05/29/21 1615  AST 154*  ALT 109*  ALKPHOS 139*  BILITOT 1.2  PROT 7.3  ALBUMIN 4.1   No results for input(s): LIPASE, AMYLASE in the last 168 hours. No results for input(s): AMMONIA in the last 168 hours. Coagulation Profile: No results for input(s): INR, PROTIME in the last 168 hours. Cardiac Enzymes: No results for input(s): CKTOTAL, CKMB, CKMBINDEX, TROPONINI in the last 168 hours. BNP (last 3 results) No results for input(s): PROBNP in the last 8760 hours. HbA1C: No results for input(s): HGBA1C in the last 72 hours. CBG: No results for input(s): GLUCAP in the last 168 hours. Lipid Profile: No results for input(s): CHOL, HDL, LDLCALC, TRIG, CHOLHDL, LDLDIRECT in the last 72 hours. Thyroid Function Tests: No results for input(s): TSH, T4TOTAL, FREET4, T3FREE, THYROIDAB in the last 72 hours. Anemia Panel: No results for input(s): VITAMINB12, FOLATE, FERRITIN, TIBC, IRON, RETICCTPCT in the last 72 hours. Urine analysis:    Component Value Date/Time  COLORURINE YELLOW 05/29/2021 1615   APPEARANCEUR CLEAR 05/29/2021 1615   APPEARANCEUR Cloudy 06/22/2012 2146   LABSPEC 1.010 05/29/2021 1615   LABSPEC 1.026 06/22/2012 2146   PHURINE 6.0 05/29/2021 1615   GLUCOSEU NEGATIVE 05/29/2021 1615   GLUCOSEU Negative 06/22/2012 2146   HGBUR MODERATE (A) 05/29/2021 1615   BILIRUBINUR NEGATIVE 05/29/2021 1615   BILIRUBINUR neg 03/05/2021 1603   BILIRUBINUR Negative 06/22/2012 2146   KETONESUR NEGATIVE 05/29/2021 1615   PROTEINUR 30 (A) 05/29/2021 1615   UROBILINOGEN 0.2 03/11/2021 1427   NITRITE POSITIVE (A) 05/29/2021 1615   LEUKOCYTESUR SMALL (A) 05/29/2021 1615   LEUKOCYTESUR Trace 06/22/2012 2146    Radiological Exams on Admission: DG Chest 2 View  Result Date: 05/29/2021 CLINICAL DATA:  Short of breath, fever, weakness EXAM: CHEST - 2 VIEW COMPARISON:  None. FINDINGS: The heart size and mediastinal contours are within normal limits. Both lungs are  clear. The visualized skeletal structures are unremarkable. IMPRESSION: No active cardiopulmonary disease. Electronically Signed   By: Sharlet Salina M.D.   On: 05/29/2021 16:49    EKG: Personally reviewed. Sinus tachycardia, rate 128.  Rate is faster when compared to prior.  Assessment/Plan Principal Problem:   Severe sepsis with acute organ dysfunction (HCC) Active Problems:   Asthma   Acute lower UTI   Hypokalemia   Hypomagnesemia   Elevated LFTs   Natasha Sloan is a 30 y.o. female with medical history significant for asthma, anxiety who is admitted with severe sepsis due to UTI.  Severe sepsis due to UTI: Presenting with temp 102.1 F, pulse 107, RR up to 21, lactic acidosis, dysuria and abnormal urinalysis consistent with urinary source of infection.  Has some left flank/CVA tenderness suggestive of pyelonephritis. -Narrow antibiotics IV ceftriaxone -Add on urine culture, follow blood cultures -Continue maintenance IV fluid hydration overnight  Hypokalemia/hypomagnesemia: Received IV magnesium and oral K supplementation in the ED.  Give additional oral potassium supplement and repeat labs in AM.  Elevated LFTs: Mild elevation of LFTs, likely due to hypoperfusion in setting of sepsis.  Continue IV fluid hydration, check abdominal ultrasound, and repeat labs in AM.  Elevated troponin: Troponin minimally elevated at 31, likely demand from sepsis.  Denies any chest pain, EKG shows sinus tachycardia.  Continue to monitor.  Asthma: Shortness of breath prior to arrival resolved after albuterol treatment.  Currently no respiratory symptoms or wheezing on exam.  Continue albuterol as needed.  DVT prophylaxis: Lovenox Code Status: Full code Family Communication: Discussed with patient's brother at bedside Disposition Plan: From home and likely return to home pending clinical progress Consults called: None Level of care: Med-Surg Admission status:  Status is: Observation  The  patient remains OBS appropriate and will d/c before 2 midnights.  Dispo: The patient is from: Home              Anticipated d/c is to: Home              Patient currently is not medically stable to d/c.   Difficult to place patient No  Darreld Mclean MD [facility]  If 7PM-7AM, please contact night-coverage [email or web address]  05/29/2021, 8:23 PM

## 2021-05-29 NOTE — ED Triage Notes (Signed)
Hx of asthma, pt reports sudden onset of SOB this afternoon, used inhaler and it did not help. Also reports burning with urination for a "while", chills and headache since last night. HR 190s when medic arrived, had pt vagal and gave NS HR decreased to 140s. Pt denies N/V/D.

## 2021-05-29 NOTE — ED Provider Notes (Signed)
[facility] Emergency Department Provider Note   ____________________________________________   Event Date/Time   First MD Initiated Contact with Patient 05/29/21 1609     (approximate)  I have reviewed the triage vital signs and the nursing notes.   HISTORY  Chief Complaint Shortness of Breath (Sudden onset)    HPI Natasha Sloan is a 30 y.o. female with past medical history of asthma and anxiety who presents to the ED complaining of shortness of breath.  Patient reports that overnight last night she developed chills and headache.  She describes a diffuse throbbing headache with no neck stiffness, numbness, or weakness.  She had not taken her temperature at home or taken any medications.  She then had sudden onset of feeling like her heart is racing with some difficulty breathing prior to arrival.  She felt nauseous with this, but has not vomited and denies any abdominal pain.  She does states she has been dealing with 4 days of dysuria, denies flank pain.  She has not had any sick contacts, has not been vaccinated against COVID-19.  Patient used her inhaler prior to arrival with improvement in her difficulty breathing.  EMS was called, found patient to have half a heart rate as high as 190 at that time.  She was given 500 cc of IV fluids with improvement of heart rate into the 140s.        Past Medical History:  Diagnosis Date   Anxiety    Asthma    History of anemia    History of asthma    History of urinary tract infection     Patient Active Problem List   Diagnosis Date Noted   Sepsis due to urinary tract infection (HCC) 05/29/2021   Normal labor 03/13/2021   Vaginal delivery    [redacted] weeks gestation of pregnancy    Labor and delivery, indication for care 03/12/2021   History of postpartum hemorrhage 02/27/2021   Hx of maternal laceration, sulcus tear, currently pregnant, third trimester 02/27/2021   GBS bacteriuria 01/03/2021   Spotting affecting  pregnancy in third trimester 12/30/2020   Rubella non-immune status, antepartum 09/30/2020   Late prenatal care 09/27/2020   History of chlamydia 09/14/2020   Vaginal bleeding in pregnancy 09/14/2020   Type O blood, Rh positive 09/13/2020   R/O Posttraumatic stress disorder 03/29/2018   Asthma 09/09/2011    Past Surgical History:  Procedure Laterality Date   EYE SURGERY Left 2007   "remove clot"   ROOT CANAL     x1    Prior to Admission medications   Medication Sig Start Date End Date Taking? Authorizing Provider  acetaminophen (TYLENOL) 500 MG tablet Take 2 tablets (1,000 mg total) by mouth every 6 (six) hours as needed (for pain scale < 4). 03/14/21   Lawhorn, Vanessa [place], CNM  albuterol (VENTOLIN HFA) 108 (90 Base) MCG/ACT inhaler Inhale 2 puffs into the lungs every 6 (six) hours as needed for wheezing or shortness of breath. 10/23/20   Doreene Burke, CNM  dibucaine (NUPERCAINAL) 1 % OINT Place 1 application rectally as needed for hemorrhoids. 03/14/21   Lawhorn, Vanessa [place], CNM  docusate sodium (COLACE) 100 MG capsule Take 1 capsule (100 mg total) by mouth 2 (two) times daily as needed for mild constipation. 03/14/21   Lawhorn, Vanessa [place], CNM  ibuprofen (ADVIL) 600 MG tablet Take 1 tablet (600 mg total) by mouth every 6 (six) hours. 03/14/21   Lawhorn, Vanessa [place], CNM  Prenat-FeAsp-Meth-FA-DHA w/o A (  PRENATE PIXIE) 10-0.6-0.4-200 MG CAPS Take 200 mg by mouth daily. Patient not taking: No sig reported 01/17/21   Lawhorn, Vanessa [place], CNM  Prenatal 27-1 MG TABS Take by mouth. Patient not taking: No sig reported    [provider]  senna-docusate (SENOKOT-S) 8.6-50 MG tablet Take 2 tablets by mouth daily. 03/14/21   Lawhorn, Vanessa [place], CNM  witch hazel-glycerin (TUCKS) pad Apply 1 application topically as needed for hemorrhoids. 03/14/21   Gunnar Bulla, CNM    Allergies Patient has no known allergies.  Family History  Problem  Relation Age of Onset   Breast cancer Mother    Diabetes Mother    Diabetes Father     Social History Social History   Tobacco Use   Smoking status: Never   Smokeless tobacco: Never  Vaping Use   Vaping Use: Never used  Substance Use Topics   Alcohol use: Not Currently    Comment: last use - 07/01/2020   Drug use: Not Currently    Types: Cocaine    Comment: last used cocaine-  first week 07/2020    Review of Systems  Constitutional: Positive for fever and chills. Eyes: No visual changes. ENT: No sore throat. Cardiovascular: Positive for palpitations and chest tightness. Respiratory: Negative for cough, positive for shortness of breath. Gastrointestinal: No abdominal pain.  Positive for nausea, no vomiting.  No diarrhea.  No constipation. Genitourinary: Positive for dysuria. Musculoskeletal: Negative for back pain. Skin: Negative for rash. Neurological: Negative for headaches, focal weakness or numbness.  ____________________________________________   PHYSICAL EXAM:  VITAL SIGNS: ED Triage Vitals  Enc Vitals Group     BP 05/29/21 1605 (!) 111/54     Pulse Rate 05/29/21 1605 (!) 137     Resp 05/29/21 1605 (!) 22     Temp 05/29/21 1605 (!) 102.1 F (38.9 C)     Temp Source 05/29/21 1605 Oral     SpO2 05/29/21 1605 98 %     Weight 05/29/21 1607 141 lb (64 kg)     Height 05/29/21 1607 5\' 1"  (1.549 m)     Head Circumference --      Peak Flow --      Pain Score 05/29/21 1606 0     Pain Loc --      Pain Edu? --      Excl. in GC? --     Constitutional: Alert and oriented. Eyes: Conjunctivae are normal. Head: Atraumatic. Nose: No congestion/rhinnorhea. Mouth/Throat: Mucous membranes are moist. Neck: Normal ROM Cardiovascular: Tachycardic, regular rhythm. Grossly normal heart sounds.  2+ radial pulses bilaterally. Respiratory: Normal respiratory effort.  No retractions. Lungs CTAB. Gastrointestinal: Soft and nontender.  No CVA tenderness bilaterally.  No  distention. Genitourinary: deferred Musculoskeletal: No lower extremity tenderness nor edema. Neurologic:  Normal speech and language. No gross focal neurologic deficits are appreciated. Skin:  Skin is warm, dry and intact. No rash noted. Psychiatric: Mood and affect are normal. Speech and behavior are normal.  ____________________________________________   LABS (all labs ordered are listed, but only abnormal results are displayed)  Labs Reviewed  COMPREHENSIVE METABOLIC PANEL - Abnormal; Notable for the following components:      Result Value   Potassium 2.9 (*)    Glucose, Bld 119 (*)    AST 154 (*)    ALT 109 (*)    Alkaline Phosphatase 139 (*)    All other components within normal limits  LACTIC ACID, PLASMA - Abnormal; Notable for the following components:  Lactic Acid, Venous 2.0 (*)    All other components within normal limits  LACTIC ACID, PLASMA - Abnormal; Notable for the following components:   Lactic Acid, Venous 2.4 (*)    All other components within normal limits  URINALYSIS, COMPLETE (UACMP) WITH MICROSCOPIC - Abnormal; Notable for the following components:   Hgb urine dipstick MODERATE (*)    Protein, ur 30 (*)    Nitrite POSITIVE (*)    Leukocytes,Ua SMALL (*)    Bacteria, UA MANY (*)    All other components within normal limits  MAGNESIUM - Abnormal; Notable for the following components:   Magnesium 1.6 (*)    All other components within normal limits  TROPONIN I (HIGH SENSITIVITY) - Abnormal; Notable for the following components:   Troponin I (High Sensitivity) 31 (*)    All other components within normal limits  RESP PANEL BY RT-PCR (FLU A&B, COVID) ARPGX2  CULTURE, BLOOD (ROUTINE X 2)  CULTURE, BLOOD (ROUTINE X 2)  CBC WITH DIFFERENTIAL/PLATELET  PROCALCITONIN  URINE DRUG SCREEN, QUALITATIVE ([facility] ONLY)  PROCALCITONIN  POC URINE PREG, ED  TROPONIN I (HIGH SENSITIVITY)   ____________________________________________  EKG  ED ECG REPORT I,  Chesley Noon, the attending physician, personally viewed and interpreted this ECG.   Date: 05/29/2021  EKG Time: 16:13  Rate: 128  Rhythm: sinus tachycardia  Axis: Normal  Intervals:none  ST&T Change: None    PROCEDURES  Procedure(s) performed (including Critical Care):  .Critical Care Performed by: Chesley Noon, MD Authorized by: Chesley Noon, MD   Critical care provider statement:    Critical care time (minutes):  45   Critical care time was exclusive of:  Separately billable procedures and treating other patients and teaching time   Critical care was necessary to treat or prevent imminent or life-threatening deterioration of the following conditions:  Sepsis   Critical care was time spent personally by me on the following activities:  Discussions with consultants, evaluation of patient's response to treatment, examination of patient, ordering and performing treatments and interventions, ordering and review of laboratory studies, ordering and review of radiographic studies, pulse oximetry, re-evaluation of patient's condition, obtaining history from patient or surrogate and review of old charts   I assumed direction of critical care for this patient from another provider in my specialty: no     Care discussed with: admitting provider     ____________________________________________   INITIAL IMPRESSION / ASSESSMENT AND PLAN / ED COURSE      30 year old female with past medical history of asthma who presents to the ED complaining of chills and headache since last night, developed sudden onset shortness of breath, palpitations, and chest tightness just prior to arrival.  Patient noted to be febrile and tachycardic by EMS, but is overall well-appearing on arrival to the ED and suspicion for sepsis is lower.  We will initiate sepsis work-up and IV fluid resuscitation, but hold off on broad-spectrum antibiotics for now.  She does have symptoms of a UTI but no findings to  suggest pyelonephritis.  I would also consider viral syndrome given her headache and malaise.  No meningismus to suggest meningitis and no focal neurologic deficits on exam.  UA is concerning for UTI and additional labs are concerning for sepsis.  Patient noted to have mildly elevated lactic acid that is improved on recheck, additionally with mildly elevated troponin and LFTs.  She continues to deny any abdominal pain, tachycardia improving with additional IV fluids.  Patient started on broad-spectrum antibiotics and  case discussed with hospitalist for admission.      ____________________________________________   FINAL CLINICAL IMPRESSION(S) / ED DIAGNOSES  Final diagnoses:  Sepsis without acute organ dysfunction, due to unspecified organism [facility])  Urinary tract infection without hematuria, site unspecified     ED Discharge Orders     None        Note:  This document was prepared using Dragon voice recognition software and may include unintentional dictation errors.    Chesley Noon, MD 05/29/21 2016

## 2021-05-30 ENCOUNTER — Observation Stay: Payer: Medicaid Other

## 2021-05-30 DIAGNOSIS — Z20822 Contact with and (suspected) exposure to covid-19: Secondary | ICD-10-CM | POA: Diagnosis present

## 2021-05-30 DIAGNOSIS — E876 Hypokalemia: Secondary | ICD-10-CM | POA: Diagnosis present

## 2021-05-30 DIAGNOSIS — Z833 Family history of diabetes mellitus: Secondary | ICD-10-CM | POA: Diagnosis not present

## 2021-05-30 DIAGNOSIS — R6520 Severe sepsis without septic shock: Secondary | ICD-10-CM | POA: Diagnosis not present

## 2021-05-30 DIAGNOSIS — K8020 Calculus of gallbladder without cholecystitis without obstruction: Secondary | ICD-10-CM | POA: Diagnosis not present

## 2021-05-30 DIAGNOSIS — A419 Sepsis, unspecified organism: Secondary | ICD-10-CM | POA: Diagnosis not present

## 2021-05-30 DIAGNOSIS — D649 Anemia, unspecified: Secondary | ICD-10-CM | POA: Diagnosis present

## 2021-05-30 DIAGNOSIS — R531 Weakness: Secondary | ICD-10-CM | POA: Diagnosis not present

## 2021-05-30 DIAGNOSIS — R509 Fever, unspecified: Secondary | ICD-10-CM | POA: Diagnosis not present

## 2021-05-30 DIAGNOSIS — E872 Acidosis, unspecified: Secondary | ICD-10-CM | POA: Diagnosis present

## 2021-05-30 DIAGNOSIS — R7989 Other specified abnormal findings of blood chemistry: Secondary | ICD-10-CM | POA: Diagnosis present

## 2021-05-30 DIAGNOSIS — R945 Abnormal results of liver function studies: Secondary | ICD-10-CM | POA: Diagnosis not present

## 2021-05-30 DIAGNOSIS — Z803 Family history of malignant neoplasm of breast: Secondary | ICD-10-CM | POA: Diagnosis not present

## 2021-05-30 DIAGNOSIS — N390 Urinary tract infection, site not specified: Secondary | ICD-10-CM | POA: Diagnosis not present

## 2021-05-30 DIAGNOSIS — R0602 Shortness of breath: Secondary | ICD-10-CM | POA: Diagnosis not present

## 2021-05-30 DIAGNOSIS — Z23 Encounter for immunization: Secondary | ICD-10-CM | POA: Diagnosis not present

## 2021-05-30 DIAGNOSIS — R000 Tachycardia, unspecified: Secondary | ICD-10-CM | POA: Diagnosis not present

## 2021-05-30 DIAGNOSIS — J45909 Unspecified asthma, uncomplicated: Secondary | ICD-10-CM | POA: Diagnosis present

## 2021-05-30 DIAGNOSIS — Z79899 Other long term (current) drug therapy: Secondary | ICD-10-CM | POA: Diagnosis not present

## 2021-05-30 DIAGNOSIS — F419 Anxiety disorder, unspecified: Secondary | ICD-10-CM | POA: Diagnosis present

## 2021-05-30 DIAGNOSIS — A4151 Sepsis due to Escherichia coli [E. coli]: Secondary | ICD-10-CM | POA: Diagnosis present

## 2021-05-30 DIAGNOSIS — I248 Other forms of acute ischemic heart disease: Secondary | ICD-10-CM | POA: Diagnosis present

## 2021-05-30 HISTORY — DX: Sepsis, unspecified organism: A41.9

## 2021-05-30 LAB — COMPREHENSIVE METABOLIC PANEL
ALT: 75 U/L — ABNORMAL HIGH (ref 0–44)
AST: 66 U/L — ABNORMAL HIGH (ref 15–41)
Albumin: 3.1 g/dL — ABNORMAL LOW (ref 3.5–5.0)
Alkaline Phosphatase: 101 U/L (ref 38–126)
Anion gap: 6 (ref 5–15)
BUN: 12 mg/dL (ref 6–20)
CO2: 24 mmol/L (ref 22–32)
Calcium: 8.1 mg/dL — ABNORMAL LOW (ref 8.9–10.3)
Chloride: 108 mmol/L (ref 98–111)
Creatinine, Ser: 0.63 mg/dL (ref 0.44–1.00)
GFR, Estimated: >60 mL/min (ref >60)
Glucose, Bld: 115 mg/dL — ABNORMAL HIGH (ref 70–99)
Potassium: 3.6 mmol/L (ref 3.5–5.1)
Sodium: 138 mmol/L (ref 135–145)
Total Bilirubin: 1.2 mg/dL (ref 0.3–1.2)
Total Protein: 6 g/dL — ABNORMAL LOW (ref 6.5–8.1)

## 2021-05-30 LAB — CBC
HCT: 31 % — ABNORMAL LOW (ref 36.0–46.0)
Hemoglobin: 10.7 g/dL — ABNORMAL LOW (ref 12.0–15.0)
MCH: 29.6 pg (ref 26.0–34.0)
MCHC: 34.5 g/dL (ref 30.0–36.0)
MCV: 85.6 fL (ref 80.0–100.0)
Platelets: 175 10*3/uL (ref 150–400)
RBC: 3.62 MIL/uL — ABNORMAL LOW (ref 3.87–5.11)
RDW: 13.3 % (ref 11.5–15.5)
WBC: 7 10*3/uL (ref 4.0–10.5)
nRBC: 0 % (ref 0.0–0.2)

## 2021-05-30 LAB — PROCALCITONIN: Procalcitonin: 6.56 ng/mL

## 2021-05-30 LAB — LACTIC ACID, PLASMA: Lactic Acid, Venous: 0.8 mmol/L (ref 0.5–1.9)

## 2021-05-30 LAB — TROPONIN I (HIGH SENSITIVITY): Troponin I (High Sensitivity): 25 ng/L — ABNORMAL HIGH (ref <18)

## 2021-05-30 LAB — MAGNESIUM: Magnesium: 2.2 mg/dL (ref 1.7–2.4)

## 2021-05-30 MED ORDER — ACETAMINOPHEN 325 MG PO TABS
650.0000 mg | ORAL_TABLET | Freq: Four times a day (QID) | ORAL | Status: DC | PRN
  Administered 2021-05-30 – 2021-05-31 (×5): 650 mg via ORAL
  Filled 2021-05-30 (×5): qty 2

## 2021-05-30 MED ORDER — KETOROLAC TROMETHAMINE 30 MG/ML IJ SOLN
30.0000 mg | Freq: Once | INTRAMUSCULAR | Status: AC
  Administered 2021-05-30: 30 mg via INTRAVENOUS
  Filled 2021-05-30: qty 1

## 2021-05-30 MED ORDER — SODIUM CHLORIDE 0.9 % IV SOLN: INTRAVENOUS | Status: DC

## 2021-05-30 NOTE — Progress Notes (Signed)
PROGRESS NOTE    Natasha Sloan  UEK:[id number] DOB: October 18, 1990 DOA: 05/29/2021 PCP: Gunnar Bulla, CNM    Brief Narrative:  Natasha Sloan is a 30 y.o. female with medical history significant for asthma, anxiety who presented to the ED for evaluation of dysuria, fever. Yesterday she developed chills, fevers, diaphoresis, headache, nausea with one episode of emesis  9/30 complaining of left CVA pain feels a little better.  Not able to take any p.o. intake.  Consultants:    Procedures:   Antimicrobials:  Rocephin   Subjective: No diarrhea, abdominal pain, reports withdrawal decreased appetite  Objective: Vitals:   05/29/21 2226 05/30/21 0044 05/30/21 0541 05/30/21 0725  BP: 90/62 94/64 (!) 85/63 (!) 81/56  Pulse: 79 84 80 74  Resp: 16 16 16 14   Temp: 97.6 F (36.4 C) 97.6 F (36.4 C) 97.9 F (36.6 C) 98 F (36.7 C)  TempSrc:    Axillary  SpO2: 100% 100% 100% 100%  Weight:      Height:        Intake/Output Summary (Last 24 hours) at 05/30/2021 0825 Last data filed at 05/30/2021 0600 Gross per 24 hour  Intake 1796.87 ml  Output --  Net 1796.87 ml   Filed Weights   05/29/21 1607  Weight: 64 kg    Examination:  General exam: Appears calm, NAD Respiratory system: Clear to auscultation. Respiratory effort normal. Cardiovascular system: S1 & S2 heard, RRR. No JVD, murmurs, rubs, gallops or clicks.  Gastrointestinal system: Abdomen is nondistended, soft and nontender. No organomegaly or masses felt. Normal bowel sounds heard. Left CVA tenderness Central nervous system: Alert and oriented. No focal neurological deficits. Extremities: No edema Psychiatry: Judgement and insight appear normal. Mood & affect appropriate.     Data Reviewed: I have personally reviewed following labs and imaging studies  CBC: Recent Labs  Lab 05/29/21 1615 05/30/21 0057  WBC 6.9 7.0  NEUTROABS 5.6  --   HGB 13.2 10.7*  HCT 37.0 31.0*  MCV 85.3 85.6  PLT 188 175    Basic Metabolic Panel: Recent Labs  Lab 05/29/21 1615 05/30/21 0057  NA 138 138  K 2.9* 3.6  CL 103 108  CO2 24 24  GLUCOSE 119* 115*  BUN 13 12  CREATININE 0.94 0.63  CALCIUM 8.9 8.1*  MG 1.6* 2.2   GFR: Estimated Creatinine Clearance: 90.5 mL/min (by C-G formula based on SCr of 0.63 mg/dL). Liver Function Tests: Recent Labs  Lab 05/29/21 1615 05/30/21 0057  AST 154* 66*  ALT 109* 75*  ALKPHOS 139* 101  BILITOT 1.2 1.2  PROT 7.3 6.0*  ALBUMIN 4.1 3.1*   No results for input(s): LIPASE, AMYLASE in the last 168 hours. No results for input(s): AMMONIA in the last 168 hours. Coagulation Profile: No results for input(s): INR, PROTIME in the last 168 hours. Cardiac Enzymes: No results for input(s): CKTOTAL, CKMB, CKMBINDEX, TROPONINI in the last 168 hours. BNP (last 3 results) No results for input(s): PROBNP in the last 8760 hours. HbA1C: No results for input(s): HGBA1C in the last 72 hours. CBG: No results for input(s): GLUCAP in the last 168 hours. Lipid Profile: No results for input(s): CHOL, HDL, LDLCALC, TRIG, CHOLHDL, LDLDIRECT in the last 72 hours. Thyroid Function Tests: No results for input(s): TSH, T4TOTAL, FREET4, T3FREE, THYROIDAB in the last 72 hours. Anemia Panel: No results for input(s): VITAMINB12, FOLATE, FERRITIN, TIBC, IRON, RETICCTPCT in the last 72 hours. Sepsis Labs: Recent Labs  Lab 05/29/21 1615 05/29/21 1617 05/30/21 0057  PROCALCITON 0.46  --  6.56  LATICACIDVEN 2.4* 2.0*  --     Recent Results (from the past 240 hour(s))  Culture, blood (routine x 2)     Status: None (Preliminary result)   Collection Time: 05/29/21  4:15 PM   Specimen: BLOOD  Result Value Ref Range Status   Specimen Description BLOOD RIGHT ANTECUBITAL  Final   Special Requests   Final    BOTTLES DRAWN AEROBIC AND ANAEROBIC Blood Culture results may not be optimal due to an excessive volume of blood received in culture bottles   Culture   Final    NO GROWTH <  24 HOURS Performed at [facility], [address], [place], [place] [address]    Report Status PENDING  Incomplete  Culture, blood (routine x 2)     Status: None (Preliminary result)   Collection Time: 05/29/21  4:15 PM   Specimen: BLOOD  Result Value Ref Range Status   Specimen Description BLOOD LEFT ANTECUBITAL  Final   Special Requests   Final    BOTTLES DRAWN AEROBIC AND ANAEROBIC Blood Culture adequate volume   Culture   Final    NO GROWTH < 24 HOURS Performed at [facility], [address], [place], [place] [address]    Report Status PENDING  Incomplete  Resp Panel by RT-PCR (Flu A&B, Covid) Nasopharyngeal Swab     Status: None   Collection Time: 05/29/21  4:15 PM   Specimen: Nasopharyngeal Swab; Nasopharyngeal(NP) swabs in vial transport medium  Result Value Ref Range Status   SARS Coronavirus 2 by RT PCR NEGATIVE NEGATIVE Final    Comment: (NOTE) SARS-CoV-2 target nucleic acids are NOT DETECTED.  The SARS-CoV-2 RNA is generally detectable in upper respiratory specimens during the acute phase of infection. The lowest concentration of SARS-CoV-2 viral copies this assay can detect is 138 copies/mL. A negative result does not preclude SARS-Cov-2 infection and should not be used as the sole basis for treatment or other patient management decisions. A negative result may occur with  improper specimen collection/handling, submission of specimen other than nasopharyngeal swab, presence of viral mutation(s) within the areas targeted by this assay, and inadequate number of viral copies(<138 copies/mL). A negative result must be combined with clinical observations, patient history, and epidemiological information. The expected result is Negative.  Fact Sheet for Patients:  BloggerCourse.com  Fact Sheet for Healthcare Providers:  SeriousBroker.it  This test is no t yet approved or cleared by the [place] FDA and  has been authorized for detection and/or diagnosis of SARS-CoV-2 by FDA under an Emergency Use Authorization (EUA). This EUA will remain  in effect (meaning this test can be used) for the duration of the COVID-19 declaration under Section 564(b)(1) of the Act, 21 U.S.C.section 360bbb-3(b)(1), unless the authorization is terminated  or revoked sooner.       Influenza A by PCR NEGATIVE NEGATIVE Final   Influenza B by PCR NEGATIVE NEGATIVE Final    Comment: (NOTE) The Xpert Xpress SARS-CoV-2/FLU/RSV plus assay is intended as an aid in the diagnosis of influenza from Nasopharyngeal swab specimens and should not be used as a sole basis for treatment. Nasal washings and aspirates are unacceptable for Xpert Xpress SARS-CoV-2/FLU/RSV testing.  Fact Sheet for Patients: BloggerCourse.com  Fact Sheet for Healthcare Providers: SeriousBroker.it  This test is not yet approved or cleared by the [place] FDA and has been authorized for detection and/or diagnosis of SARS-CoV-2 by FDA under an Emergency Use Authorization (  EUA). This EUA will remain in effect (meaning this test can be used) for the duration of the COVID-19 declaration under Section 564(b)(1) of the Act, 21 U.S.C. section 360bbb-3(b)(1), unless the authorization is terminated or revoked.  Performed at [facility], [address], [place], [place] [address]          Radiology Studies: DG Chest 2 View  Result Date: 05/29/2021 CLINICAL DATA:  Short of breath, fever, weakness EXAM: CHEST - 2 VIEW COMPARISON:  None. FINDINGS: The heart size and mediastinal contours are within normal limits. Both lungs are clear. The visualized skeletal structures are unremarkable. IMPRESSION: No active cardiopulmonary disease. Electronically Signed   By: Sharlet Salina M.D.   On: 05/29/2021 16:49        Scheduled Meds:  enoxaparin (LOVENOX) injection  40 mg  Subcutaneous Q24H   influenza vac split quadrivalent PF  0.5 mL Intramuscular Tomorrow-1000   pneumococcal 23 valent vaccine  0.5 mL Intramuscular Tomorrow-1000   Continuous Infusions:  cefTRIAXone (ROCEPHIN)  IV 2 g (05/30/21 0542)   lactated ringers 100 mL/hr at 05/29/21 2238    Assessment & Plan:   Principal Problem:   Severe sepsis with acute organ dysfunction (HCC) Active Problems:   Asthma   Complicated UTI (urinary tract infection)   Hypokalemia   Hypomagnesemia   Elevated LFTs   Keishana Bungert is a 30 y.o. female with medical history significant for asthma, anxiety who is admitted with severe sepsis due to UTI.   Severe sepsis due to UTI: Minimally improved Still with dysuria and Lt CVA tenderness Continue iv rocephin Elevated procalcitonin Start IV fluid normal saline 100 mL/h for hydration as her p.o. intake is low Follow-up urine and blood cultures    Hypokalemia/hypomagnesemia: Received supplementation of her magnesium and potassium Currently stable  Elevated LFTs: Mild elevation of LFTs, likely due to hypoperfusion in setting of sepsis.   9/30 improving  Continue IV fluids  Abdominal ultrasound with cholelithiasis but no acute cholecystitis    Elevated troponin: Minimally elevated trending down Likely secondary to demand ischemia   Asthma: Shortness of breath prior to arrival resolved after albuterol treatment.   Currently without any acute exacerbation stable    DVT prophylaxis: Lovenox Code Status: Full Family Communication: None at bedside Disposition Plan:  Status is: Observation  The patient remains OBS appropriate and will d/c before 2 midnights.  Dispo: The patient is from: Home              Anticipated d/c is to: Home              Patient currently is not medically stable to d/c.   Difficult to place patient No   Patient with decreased p.o. intake and pain still.  Awaiting culture results.         LOS: 0 days   Time spent:  35 minutes with more than 50% on COC    Lynn Ito, MD [facility] Pager [phone]  If 7PM-7AM, please contact night-coverage 05/30/2021, 8:25 AM  prog

## 2021-05-30 NOTE — TOC Progression Note (Signed)
Transition of Care [facility]) - Progression Note    Patient Details  Name: Natasha Sloan MRN: [id number] Date of Birth: 11/24/1990  Transition of Care [facility]) CM/SW Contact  Allayne Butcher, RN Phone Number: 05/30/2021, 9:15 AM  Clinical Narrative:    [facility] consult acknowledged for PCP needs.  Patient has been seeing a nurse Midwife, recently delivered a baby in July.  Patient has Medicaid and automatically assigned a PCP with Medicaid.          Expected Discharge Plan and Services                                                 Social Determinants of Health (SDOH) Interventions    Readmission Risk Interventions No flowsheet data found.

## 2021-05-31 LAB — URINE CULTURE: Culture: >=100000 — AB

## 2021-05-31 MED ORDER — KETOROLAC TROMETHAMINE 30 MG/ML IJ SOLN
30.0000 mg | Freq: Four times a day (QID) | INTRAMUSCULAR | Status: DC | PRN
  Administered 2021-05-31 – 2021-06-01 (×2): 30 mg via INTRAVENOUS
  Filled 2021-05-31 (×2): qty 1

## 2021-05-31 NOTE — Progress Notes (Signed)
PROGRESS NOTE    Natasha Sloan  LPF:[id number] DOB: [birth year] DOA: 05/29/2021 PCP: Gunnar Bulla, CNM    Brief Narrative:  Natasha Sloan is a 30 y.o. female with medical history significant for asthma, anxiety who presented to the ED for evaluation of dysuria, fever. Yesterday she developed chills, fevers, diaphoresis, headache, nausea with one episode of emesis  9/30 complaining of left CVA pain feels a little better.  Not able to take any p.o. intake. 10/1- feels a little better. Tmax 101/7. Denies dysuria, but still with mild LT CVA tenderness.    Consultants:    Procedures:   Antimicrobials:  Rocephin   Subjective: Trying to eat, but not much appetite.   Objective: Vitals:   05/31/21 0035 05/31/21 0304 05/31/21 0514 05/31/21 0731  BP: 102/64 95/62 (!) 92/58 103/72  Pulse: 94 100 77 86  Resp: 16 16 16 17   Temp: 98.6 F (37 C) 99.2 F (37.3 C) 98.8 F (37.1 C) 98.2 F (36.8 C)  TempSrc: Oral     SpO2: 100% 100% 98% 100%  Weight:      Height:        Intake/Output Summary (Last 24 hours) at 05/31/2021 0756 Last data filed at 05/30/2021 1848 Gross per 24 hour  Intake 480 ml  Output --  Net 480 ml   Filed Weights   05/29/21 1607  Weight: 64 kg    Examination: Calm, NAD CTA no wheeze rales rhonchi's Regular S1 S2 no gallops Mild left CVA tenderness Soft benign positive bowel sounds No edema aaxox3    Data Reviewed: I have personally reviewed following labs and imaging studies  CBC: Recent Labs  Lab 05/29/21 1615 05/30/21 0057  WBC 6.9 7.0  NEUTROABS 5.6  --   HGB 13.2 10.7*  HCT 37.0 31.0*  MCV 85.3 85.6  PLT 188 175   Basic Metabolic Panel: Recent Labs  Lab 05/29/21 1615 05/30/21 0057  NA 138 138  K 2.9* 3.6  CL 103 108  CO2 24 24  GLUCOSE 119* 115*  BUN 13 12  CREATININE 0.94 0.63  CALCIUM 8.9 8.1*  MG 1.6* 2.2   GFR: Estimated Creatinine Clearance: 90.5 mL/min (by C-G formula based on SCr of 0.63 mg/dL). Liver  Function Tests: Recent Labs  Lab 05/29/21 1615 05/30/21 0057  AST 154* 66*  ALT 109* 75*  ALKPHOS 139* 101  BILITOT 1.2 1.2  PROT 7.3 6.0*  ALBUMIN 4.1 3.1*   No results for input(s): LIPASE, AMYLASE in the last 168 hours. No results for input(s): AMMONIA in the last 168 hours. Coagulation Profile: No results for input(s): INR, PROTIME in the last 168 hours. Cardiac Enzymes: No results for input(s): CKTOTAL, CKMB, CKMBINDEX, TROPONINI in the last 168 hours. BNP (last 3 results) No results for input(s): PROBNP in the last 8760 hours. HbA1C: No results for input(s): HGBA1C in the last 72 hours. CBG: No results for input(s): GLUCAP in the last 168 hours. Lipid Profile: No results for input(s): CHOL, HDL, LDLCALC, TRIG, CHOLHDL, LDLDIRECT in the last 72 hours. Thyroid Function Tests: No results for input(s): TSH, T4TOTAL, FREET4, T3FREE, THYROIDAB in the last 72 hours. Anemia Panel: No results for input(s): VITAMINB12, FOLATE, FERRITIN, TIBC, IRON, RETICCTPCT in the last 72 hours. Sepsis Labs: Recent Labs  Lab 05/29/21 1615 05/29/21 1617 05/30/21 0057 05/30/21 2239  PROCALCITON 0.46  --  6.56  --   LATICACIDVEN 2.4* 2.0*  --  0.8    Recent Results (from the past 240 hour(s))  Culture,  blood (routine x 2)     Status: None (Preliminary result)   Collection Time: 05/29/21  4:15 PM   Specimen: BLOOD  Result Value Ref Range Status   Specimen Description BLOOD RIGHT ANTECUBITAL  Final   Special Requests   Final    BOTTLES DRAWN AEROBIC AND ANAEROBIC Blood Culture results may not be optimal due to an excessive volume of blood received in culture bottles   Culture   Final    NO GROWTH 2 DAYS Performed at [facility], [address] [place], [place] [address]    Report Status PENDING  Incomplete  Culture, blood (routine x 2)     Status: None (Preliminary result)   Collection Time: 05/29/21  4:15 PM   Specimen: BLOOD  Result Value Ref Range Status   Specimen  Description BLOOD LEFT ANTECUBITAL  Final   Special Requests   Final    BOTTLES DRAWN AEROBIC AND ANAEROBIC Blood Culture adequate volume   Culture   Final    NO GROWTH 2 DAYS Performed at [facility], [address], [place], [place] [address]    Report Status PENDING  Incomplete  Resp Panel by RT-PCR (Flu A&B, Covid) Nasopharyngeal Swab     Status: None   Collection Time: 05/29/21  4:15 PM   Specimen: Nasopharyngeal Swab; Nasopharyngeal(NP) swabs in vial transport medium  Result Value Ref Range Status   SARS Coronavirus 2 by RT PCR NEGATIVE NEGATIVE Final    Comment: (NOTE) SARS-CoV-2 target nucleic acids are NOT DETECTED.  The SARS-CoV-2 RNA is generally detectable in upper respiratory specimens during the acute phase of infection. The lowest concentration of SARS-CoV-2 viral copies this assay can detect is 138 copies/mL. A negative result does not preclude SARS-Cov-2 infection and should not be used as the sole basis for treatment or other patient management decisions. A negative result may occur with  improper specimen collection/handling, submission of specimen other than nasopharyngeal swab, presence of viral mutation(s) within the areas targeted by this assay, and inadequate number of viral copies(<138 copies/mL). A negative result must be combined with clinical observations, patient history, and epidemiological information. The expected result is Negative.  Fact Sheet for Patients:  BloggerCourse.com  Fact Sheet for Healthcare Providers:  SeriousBroker.it  This test is no t yet approved or cleared by the [place] FDA and  has been authorized for detection and/or diagnosis of SARS-CoV-2 by FDA under an Emergency Use Authorization (EUA). This EUA will remain  in effect (meaning this test can be used) for the duration of the COVID-19 declaration under Section 564(b)(1) of the Act, 21 U.S.C.section  360bbb-3(b)(1), unless the authorization is terminated  or revoked sooner.       Influenza A by PCR NEGATIVE NEGATIVE Final   Influenza B by PCR NEGATIVE NEGATIVE Final    Comment: (NOTE) The Xpert Xpress SARS-CoV-2/FLU/RSV plus assay is intended as an aid in the diagnosis of influenza from Nasopharyngeal swab specimens and should not be used as a sole basis for treatment. Nasal washings and aspirates are unacceptable for Xpert Xpress SARS-CoV-2/FLU/RSV testing.  Fact Sheet for Patients: BloggerCourse.com  Fact Sheet for Healthcare Providers: SeriousBroker.it  This test is not yet approved or cleared by the [place] FDA and has been authorized for detection and/or diagnosis of SARS-CoV-2 by FDA under an Emergency Use Authorization (EUA). This EUA will remain in effect (meaning this test can be used) for the duration of the COVID-19 declaration under Section 564(b)(1) of the Act, 21  U.S.C. section 360bbb-3(b)(1), unless the authorization is terminated or revoked.  Performed at [facility], [address], [place], [place] [address]   Urine Culture     Status: Abnormal (Preliminary result)   Collection Time: 05/29/21  4:15 PM   Specimen: Urine, Clean Catch  Result Value Ref Range Status   Specimen Description   Final    URINE, CLEAN CATCH Performed at [facility], [address], [place], [place] [address]    Special Requests   Final    NONE Performed at [facility], [address], [place], [place] [address]    Culture (A)  Final    >=100,000 COLONIES/mL ESCHERICHIA COLI SUSCEPTIBILITIES TO FOLLOW Performed at [facility], [address] [place], [place] [address]    Report Status PENDING  Incomplete         Radiology Studies: DG Chest 2 View  Result Date: 05/29/2021 CLINICAL DATA:  Short of breath, fever, weakness EXAM: CHEST - 2 VIEW COMPARISON:  None. FINDINGS:  The heart size and mediastinal contours are within normal limits. Both lungs are clear. The visualized skeletal structures are unremarkable. IMPRESSION: No active cardiopulmonary disease. Electronically Signed   By: Sharlet Salina M.D.   On: 05/29/2021 16:49   US Abdomen Complete  Result Date: 05/30/2021 CLINICAL DATA:  Elevated LFTs, sepsis EXAM: ABDOMEN ULTRASOUND COMPLETE COMPARISON:  CT 02/06/2020 FINDINGS: Gallbladder: Mobile 9 mm gallstone within the gallbladder. No wall thickening or sonographic Murphy sign. Common bile duct: Diameter: Normal caliber, 3 mm. Liver: No focal lesion identified. Within normal limits in parenchymal echogenicity. Portal vein is patent on color Doppler imaging with normal direction of blood flow towards the liver. IVC: No abnormality visualized. Pancreas: Visualized portion unremarkable. Spleen: Size and appearance within normal limits. Right Kidney: Length: 11.1 cm. Echogenicity within normal limits. No mass or hydronephrosis visualized. Left Kidney: Length: 11.2 cm. Echogenicity within normal limits. No mass or hydronephrosis visualized. Abdominal aorta: No aneurysm visualized. Other findings: None. IMPRESSION: Cholelithiasis.  No sonographic evidence of acute cholecystitis. Electronically Signed   By: Charlett Nose M.D.   On: 05/30/2021 09:05        Scheduled Meds:  enoxaparin (LOVENOX) injection  40 mg Subcutaneous Q24H   Continuous Infusions:  sodium chloride 100 mL/hr at 05/30/21 2307   cefTRIAXone (ROCEPHIN)  IV 2 g (05/31/21 0501)    Assessment & Plan:   Principal Problem:   Severe sepsis with acute organ dysfunction (HCC) Active Problems:   Asthma   Complicated UTI (urinary tract infection)   Hypokalemia   Hypomagnesemia   Elevated LFTs   Sepsis due to urinary tract infection (HCC)   Natasha Sloan is a 30 y.o. female with medical history significant for asthma, anxiety who is admitted with severe sepsis due to UTI.   Severe sepsis due to  UTI: Improving slowly Still with left CVA tenderness Urine culture more than 100,000 E. coli, susceptibility pending Blood culture pending Continue IV Rocephin Elevated procalcitonin Continue IV fluids  E.Coli UTI Continue IV fluids Continue Rocephin Follow-up susceptibility   Hypokalemia/hypomagnesemia: Supplemented and stable monitor periodically   Elevated LFTs: Mild elevation of LFTs, likely due to hypoperfusion in setting of sepsis.   10/1 improving Continue IV fluids Abdominal ultrasound with cholelithiasis but no acute cholecystitis Monitor LFTs    Elevated troponin: Minimally elevated trending down 10/1 likely secondary to demand ischemia Trending down   Asthma: Shortness of breath prior to arrival resolved after albuterol treatment.   Currently  without any acute exacerbation stable    DVT prophylaxis: Lovenox Code Status: Full Family Communication: None at bedside Disposition Plan:  Status is: Observation  The patient remains OBS appropriate and will d/c before 2 midnights.  Dispo: The patient is from: Home              Anticipated d/c is to: Home              Patient currently is not medically stable to d/c.   Difficult to place patient No   Patient with decreased p.o. intake and pain still.  Awaiting culture results.         LOS: 1 day   Time spent: 35 minutes with more than 50% on COC    Lynn Ito, MD [facility] Pager [phone]  If 7PM-7AM, please contact night-coverage 05/31/2021, 7:56 AM

## 2021-05-31 NOTE — Progress Notes (Signed)
05/30/21 2026 05/30/21 2041 05/30/21 2208  Assess: MEWS Score  Temp (!) 101.8 F (38.8 C) 100 F (37.8 C) (!) 101.7 F (38.7 C)  BP 114/73 115/78 108/77  Pulse Rate (!) 119 (!) 115 (!) 101  Resp 16 16 16   SpO2 100 % 99 % 99 %  O2 Device Room Air Room Air Room Air  Assess: MEWS Score  MEWS Temp 2 0 2  MEWS Systolic 0 0 0  MEWS Pulse 2 2 1   MEWS RR 0 0 0  MEWS LOC 0 0 0  MEWS Score 4 2 3   MEWS Score Color Red Yellow Yellow  Assess: if the MEWS score is Yellow or Red  Were vital signs taken at a resting state? Yes Yes Yes  Focused Assessment No change from prior assessment No change from prior assessment No change from prior assessment  Does the patient meet 2 or more of the SIRS criteria? Yes No No  Does the patient have a confirmed or suspected source of infection? Yes (urinary tract infection) Yes (urinary tract infection) Yes (urinary tract infection)  Provider and Rapid Response Notified?  --   --   --   MEWS guidelines implemented *See Row Information* No, vital signs rechecked No, vital signs rechecked  --   Treat  Pain Scale  --  0-10 0-10  Pain Score  --  0 8  Pain Type  --   --  Other (Comment) (cramping like sensation)  Pain Location  --   --  Abdomen  Pain Orientation  --   --  Mid;Lower;Left  Pain Descriptors / Indicators  --   --  Cramping  Pain Frequency  --   --  Constant  Patients Stated Pain Goal  --   --  0  Pain Intervention(s)  --   --   --   Multiple Pain Sites  --   --  No  Complains of  --  Other (Comment) (no complaints) Other (Comment) (room too hot, room temperature some one had turned it up to 80 degrees, now turned down to 70. ice pack for abdominal pain)  Interventions  --   (Patient had recieved tylenol650 mg at 1913 & 3 blankets removed off by patient that she had on.) Cold pack  Patients response to intervention  --   --   --   Take Vital Signs  Increase Vital Sign Frequency   --  Yellow: Q 2hr X 2 then Q 4hr X 2, if remains yellow,  continue Q 4hrs  --   Escalate  MEWS: Escalate  --  Yellow: discuss with charge nurse/RN and consider discussing with provider and RRT  --   Notify: Charge Nurse/RN  Name of Charge Nurse/RN Notified  --   --   Date Charge Nurse/RN Notified  --  05/30/21  --   Time Charge Nurse/RN Notified  --  2035  --   Notify: Provider  Provider Name/Title  --   --   --   Date Provider Notified  --   --   --   Time Provider Notified  --   --   --   Notification Type  --   --   --   Notification Reason  --   --   --   Provider response  --   --   --   Date of Provider Response  --   --   --   Time of Provider  Response  --   --   --   Assess: SIRS CRITERIA  SIRS Temperature  1 0 1  SIRS Pulse 1 1 1   SIRS Respirations  0 0 0  SIRS WBC 0 0 0  SIRS Score Sum  2 1 2     05/30/21 2230 05/30/21 2300 05/31/21 0035  Assess: MEWS Score  Temp  --  100.2 F (37.9 C) 98.6 F (37 C)  BP  --   --  102/64  Pulse Rate  --   --  94  Resp  --   --  16  SpO2  --   --  100 %  O2 Device  --   --  Room Air  Assess: MEWS Score  MEWS Temp  --  0 0  MEWS Systolic  --  0 0  MEWS Pulse  --  1 0  MEWS RR  --  0 0  MEWS LOC  --  0 0  MEWS Score  --  1 0  MEWS Score Color  --  06/01/21  Assess: if the MEWS score is Yellow or Red  Were vital signs taken at a resting state?  --   --  Yes  Focused Assessment  --   --  No change from prior assessment  Does the patient meet 2 or more of the SIRS criteria?  --   --  No  Does the patient have a confirmed or suspected source of infection?  --   --   --   Provider and Rapid Response Notified? Yes (provider notified, new orders received for iv toradol  30 mg x1)  --   --   MEWS guidelines implemented *See Row Information*  --   --   --   Treat  Pain Scale  --   --   --   Pain Score  --   --   --   Pain Type  --   --   --   Pain Location  --   --   --   Pain Orientation  --   --   --   Pain Descriptors / Indicators  --   --   --   Pain Frequency  --    --   --   Patients Stated Pain Goal  --   --   --   Pain Intervention(s) Medication (See eMAR)  --   --   Multiple Pain Sites  --   --   --   Complains of  --   --   --   Interventions Other (comment) 07/31/21 NP. notified, new orders received.)  --   --   Patients response to intervention  --  Effective (temperature & pain has decreased)  --   Take Vital Signs  Increase Vital Sign Frequency   --   --   --   Escalate  MEWS: Escalate  --   --   --   Notify: Charge Nurse/RN  Name of Charge Nurse/RN Notified  --   --   --   Date Charge Nurse/RN Notified  --   --   --   Time Charge Nurse/RN Notified  --   --   --   Notify: Provider  Provider Name/Title  --  B. Riki Sheer NP  --   Date Provider Notified  --  05/30/21  --   Time Provider Notified  --  2230  --   Notification Type  --   (  secure chat)  --   Notification Reason  --  Other (Comment) (left abdominal pain 8 of 10, temperature increased, patient had received pneumonia & flu immunization at 1303 05/30/21)  --   Provider response  --  See new orders  --   Date of Provider Response  --  05/30/21  --   Time of Provider Response  --  2234  --   Assess: SIRS CRITERIA  SIRS Temperature   --  0 0  SIRS Pulse  --  1 1  SIRS Respirations   --  0 0  SIRS WBC  --  0 0  SIRS Score Sum   --  1 1

## 2021-06-01 LAB — HEPATIC FUNCTION PANEL
ALT: 46 U/L — ABNORMAL HIGH (ref 0–44)
AST: 23 U/L (ref 15–41)
Albumin: 3 g/dL — ABNORMAL LOW (ref 3.5–5.0)
Alkaline Phosphatase: 124 U/L (ref 38–126)
Bilirubin, Direct: <0.1 mg/dL (ref 0.0–0.2)
Total Bilirubin: 0.7 mg/dL (ref 0.3–1.2)
Total Protein: 6.5 g/dL (ref 6.5–8.1)

## 2021-06-01 LAB — CBC
HCT: 30.8 % — ABNORMAL LOW (ref 36.0–46.0)
Hemoglobin: 10.9 g/dL — ABNORMAL LOW (ref 12.0–15.0)
MCH: 29.9 pg (ref 26.0–34.0)
MCHC: 35.4 g/dL (ref 30.0–36.0)
MCV: 84.4 fL (ref 80.0–100.0)
Platelets: 190 10*3/uL (ref 150–400)
RBC: 3.65 MIL/uL — ABNORMAL LOW (ref 3.87–5.11)
RDW: 13.1 % (ref 11.5–15.5)
WBC: 5.2 10*3/uL (ref 4.0–10.5)
nRBC: 0 % (ref 0.0–0.2)

## 2021-06-01 MED ORDER — OXYCODONE-ACETAMINOPHEN 5-325 MG PO TABS
1.0000 | ORAL_TABLET | ORAL | 0 refills | Status: AC | PRN
Start: 2021-06-01 — End: 2021-06-03

## 2021-06-01 MED ORDER — CIPROFLOXACIN HCL 500 MG PO TABS: 500.0000 mg | ORAL_TABLET | Freq: Two times a day (BID) | ORAL | 0 refills | Status: AC

## 2021-06-01 MED ORDER — ACETAMINOPHEN 325 MG PO TABS: 650.0000 mg | ORAL_TABLET | Freq: Four times a day (QID) | ORAL | Status: DC | PRN

## 2021-06-01 MED ORDER — CEFDINIR 300 MG PO CAPS: 300.0000 mg | ORAL_CAPSULE | Freq: Two times a day (BID) | ORAL | 0 refills | Status: AC

## 2021-06-01 NOTE — Discharge Summary (Signed)
Natasha Sloan FVC:[id number] DOB: November 19, 1990 DOA: 05/29/2021  PCP: Gunnar Bulla, CNM  Admit date: 05/29/2021 Discharge date: 06/01/2021  Admitted From: home Disposition:  home  Recommendations for Outpatient Follow-up:  Follow up with PCP in 1 week Please obtain BMP/CBC in one week       Discharge Condition:Stable CODE STATUS:full  Diet recommendation: regular    Brief/Interim Summary: Per HPI: Natasha Sloan is a 30 y.o. female with medical history significant for asthma, anxiety who presented to the ED for evaluation of dysuria, fever, chills , and Lt CVA tenderness. She was found with sepsis and E.coli UTI. Feeling better today. She is lactating which she told me this am and also told me she has been pumping and dumping. I had sent script for ciprofloxacin and percocet, but called her pharmacy to not fill either med since she is lactating. Spoke to our pharmacy and they recommended Ceftidir as less issues in lactating females. She was instructed to continue pump and dump as precautionary anyways until she completes antibiotic course. She verbalizes an understanding.  Severe sepsis due to UTI: Clinically has improved UCX+ E.coli Procalcitonin was elevated Was started in on IV Rocephin, now will switch to po antibiotics.  E.Coli UTI Treated as above   Hypokalemia/hypomagnesemia: Supplemented and stable      Elevated LFTs: Mild elevation of LFTs, likely due to hypoperfusion in setting of sepsis.   Improving with IVF Abdominal ultrasound with cholelithiasis but no acute cholecystitis F/u with pcp      Elevated troponin: Minimally elevated trending down likely secondary to demand ischemia from sepsis Trended down   Asthma: Shortness of breath prior to arrival resolved after albuterol treatment.   Currently without any acute exacerbation stable    Discharge Diagnoses:  Principal Problem:   Severe sepsis with acute organ dysfunction (HCC) Active Problems:    Asthma   Complicated UTI (urinary tract infection)   Hypokalemia   Hypomagnesemia   Elevated LFTs   Sepsis due to urinary tract infection [facility])    Discharge Instructions  Discharge Instructions     Call MD for:  severe uncontrolled pain   Complete by: As directed    Call MD for:  temperature >100.4   Complete by: As directed    Diet - low sodium heart healthy   Complete by: As directed    Discharge instructions   Complete by: As directed    While taking antibiotic and pain med need to pump and dump. After the last dose of antibiotic , dump milk for 24 hrs, then can resume with breast feeding.  Sent a script for ciprofloxacin- DO NOT TAKE THIS> . Sent a new script for a different antibiotic.  F/u with pcp and obgyn   Increase activity slowly   Complete by: As directed       Allergies as of 06/01/2021   No Known Allergies      Medication List     STOP taking these medications    ibuprofen 600 MG tablet Commonly known as: ADVIL   Prenate Pixie 10-0.6-0.4-200 MG Caps       TAKE these medications    acetaminophen 325 MG tablet Commonly known as: TYLENOL Take 2 tablets (650 mg total) by mouth every 6 (six) hours as needed for mild pain or moderate pain. What changed:  medication strength how much to take reasons to take this   albuterol 108 (90 Base) MCG/ACT inhaler Commonly known as: VENTOLIN HFA Inhale 2 puffs into the lungs every  6 (six) hours as needed for wheezing or shortness of breath.   cefdinir 300 MG capsule Commonly known as: OMNICEF Take 1 capsule (300 mg total) by mouth 2 (two) times daily for 5 days.   ciprofloxacin 500 MG tablet Commonly known as: CIPRO Take 1 tablet (500 mg total) by mouth 2 (two) times daily for 7 days.   dibucaine 1 % Oint Commonly known as: NUPERCAINAL Place 1 application rectally as needed for hemorrhoids.   docusate sodium 100 MG capsule Commonly known as: COLACE Take 1 capsule (100 mg total) by mouth 2 (two)  times daily as needed for mild constipation.   oxyCODONE-acetaminophen 5-325 MG tablet Commonly known as: Percocet Take 1 tablet by mouth every 4 (four) hours as needed for up to 2 days for severe pain.   Prenatal 27-1 MG Tabs Take by mouth.   senna-docusate 8.6-50 MG tablet Commonly known as: Senokot-S Take 2 tablets by mouth daily.   witch hazel-glycerin pad Commonly known as: TUCKS Apply 1 application topically as needed for hemorrhoids.        Follow-up Information     Gunnar Bulla, CNM Follow up in 1 week(s).   Specialties: Certified Nurse Midwife, Obstetrics and Gynecology, Radiology               No Known Allergies  Consultations:    Procedures/Studies: DG Chest 2 View  Result Date: 05/29/2021 CLINICAL DATA:  Short of breath, fever, weakness EXAM: CHEST - 2 VIEW COMPARISON:  None. FINDINGS: The heart size and mediastinal contours are within normal limits. Both lungs are clear. The visualized skeletal structures are unremarkable. IMPRESSION: No active cardiopulmonary disease. Electronically Signed   By: Sharlet Salina M.D.   On: 05/29/2021 16:49   US Abdomen Complete  Result Date: 05/30/2021 CLINICAL DATA:  Elevated LFTs, sepsis EXAM: ABDOMEN ULTRASOUND COMPLETE COMPARISON:  CT 02/06/2020 FINDINGS: Gallbladder: Mobile 9 mm gallstone within the gallbladder. No wall thickening or sonographic Murphy sign. Common bile duct: Diameter: Normal caliber, 3 mm. Liver: No focal lesion identified. Within normal limits in parenchymal echogenicity. Portal vein is patent on color Doppler imaging with normal direction of blood flow towards the liver. IVC: No abnormality visualized. Pancreas: Visualized portion unremarkable. Spleen: Size and appearance within normal limits. Right Kidney: Length: 11.1 cm. Echogenicity within normal limits. No mass or hydronephrosis visualized. Left Kidney: Length: 11.2 cm. Echogenicity within normal limits. No mass or hydronephrosis  visualized. Abdominal aorta: No aneurysm visualized. Other findings: None. IMPRESSION: Cholelithiasis.  No sonographic evidence of acute cholecystitis. Electronically Signed   By: Charlett Nose M.D.   On: 05/30/2021 09:05      Subjective: Feels better.partner at bedside. Denies sob, dizziness, or abd pain  Discharge Exam: Vitals:   06/01/21 0743 06/01/21 1120  BP: 124/72 106/75  Pulse: 68 63  Resp: 16 16  Temp: 98.3 F (36.8 C) 98 F (36.7 C)  SpO2: 100% 100%   Vitals:   06/01/21 0553 06/01/21 0553 06/01/21 0743 06/01/21 1120  BP: 109/69 109/69 124/72 106/75  Pulse: 75 78 68 63  Resp: Temp: 99.1 F (37.3 C) 99.1 F (37.3 C) 98.3 F (36.8 C) 98 F (36.7 C)  TempSrc: Oral Oral Oral Oral  SpO2:  99% 100% 100%  Weight:      Height:        General: Pt is alert, awake, not in acute distress Cardiovascular: RRR, S1/S2 +, no rubs, no gallops Respiratory: CTA bilaterally, no wheezing, no rhonchi  Abdominal: Soft, NT, ND, bowel sounds + Extremities: no edema, no cyanosis    The results of significant diagnostics from this hospitalization (including imaging, microbiology, ancillary and laboratory) are listed below for reference.     Microbiology: Recent Results (from the past 240 hour(s))  Culture, blood (routine x 2)     Status: None (Preliminary result)   Collection Time: 05/29/21  4:15 PM   Specimen: BLOOD  Result Value Ref Range Status   Specimen Description BLOOD RIGHT ANTECUBITAL  Final   Special Requests   Final    BOTTLES DRAWN AEROBIC AND ANAEROBIC Blood Culture results may not be optimal due to an excessive volume of blood received in culture bottles   Culture   Final    NO GROWTH 3 DAYS Performed at [facility], [address] [place], [place] [address]    Report Status PENDING  Incomplete  Culture, blood (routine x 2)     Status: None (Preliminary result)   Collection Time: 05/29/21  4:15 PM   Specimen: BLOOD  Result Value Ref  Range Status   Specimen Description BLOOD LEFT ANTECUBITAL  Final   Special Requests   Final    BOTTLES DRAWN AEROBIC AND ANAEROBIC Blood Culture adequate volume   Culture   Final    NO GROWTH 3 DAYS Performed at [facility], [address] [place], [place] [address]    Report Status PENDING  Incomplete  Resp Panel by RT-PCR (Flu A&B, Covid) Nasopharyngeal Swab     Status: None   Collection Time: 05/29/21  4:15 PM   Specimen: Nasopharyngeal Swab; Nasopharyngeal(NP) swabs in vial transport medium  Result Value Ref Range Status   SARS Coronavirus 2 by RT PCR NEGATIVE NEGATIVE Final    Comment: (NOTE) SARS-CoV-2 target nucleic acids are NOT DETECTED.  The SARS-CoV-2 RNA is generally detectable in upper respiratory specimens during the acute phase of infection. The lowest concentration of SARS-CoV-2 viral copies this assay can detect is 138 copies/mL. A negative result does not preclude SARS-Cov-2 infection and should not be used as the sole basis for treatment or other patient management decisions. A negative result may occur with  improper specimen collection/handling, submission of specimen other than nasopharyngeal swab, presence of viral mutation(s) within the areas targeted by this assay, and inadequate number of viral copies(<138 copies/mL). A negative result must be combined with clinical observations, patient history, and epidemiological information. The expected result is Negative.  Fact Sheet for Patients:  BloggerCourse.com  Fact Sheet for Healthcare Providers:  SeriousBroker.it  This test is no t yet approved or cleared by the [place] FDA and  has been authorized for detection and/or diagnosis of SARS-CoV-2 by FDA under an Emergency Use Authorization (EUA). This EUA will remain  in effect (meaning this test can be used) for the duration of the COVID-19 declaration under Section 564(b)(1) of the Act,  21 U.S.C.section 360bbb-3(b)(1), unless the authorization is terminated  or revoked sooner.       Influenza A by PCR NEGATIVE NEGATIVE Final   Influenza B by PCR NEGATIVE NEGATIVE Final    Comment: (NOTE) The Xpert Xpress SARS-CoV-2/FLU/RSV plus assay is intended as an aid in the diagnosis of influenza from Nasopharyngeal swab specimens and should not be used as a sole basis for treatment. Nasal washings and aspirates are unacceptable for Xpert Xpress SARS-CoV-2/FLU/RSV testing.  Fact Sheet for Patients: BloggerCourse.com  Fact Sheet for Healthcare Providers: SeriousBroker.it  This test is not yet approved or cleared by  the [employer] and has been authorized for detection and/or diagnosis of SARS-CoV-2 by FDA under an Emergency Use Authorization (EUA). This EUA will remain in effect (meaning this test can be used) for the duration of the COVID-19 declaration under Section 564(b)(1) of the Act, 21 U.S.C. section 360bbb-3(b)(1), unless the authorization is terminated or revoked.  Performed at [facility], [address] [place], [place] [address]   Urine Culture     Status: Abnormal   Collection Time: 05/29/21  4:15 PM   Specimen: Urine, Clean Catch  Result Value Ref Range Status   Specimen Description   Final    URINE, CLEAN CATCH Performed at [facility], [address], [place], [place] [address]    Special Requests   Final    NONE Performed at [facility], [address] [place], [place] [address]    Culture >=100,000 COLONIES/mL ESCHERICHIA COLI (A)  Final   Report Status 05/31/2021 FINAL  Final   Organism ID, Bacteria ESCHERICHIA COLI (A)  Final      Susceptibility   Escherichia coli - MIC*    AMPICILLIN >=32 RESISTANT Resistant     CEFAZOLIN <=4 SENSITIVE Sensitive     CEFEPIME <=0.12 SENSITIVE Sensitive     CEFTRIAXONE <=0.25 SENSITIVE Sensitive     CIPROFLOXACIN  <=0.25 SENSITIVE Sensitive     GENTAMICIN <=1 SENSITIVE Sensitive     IMIPENEM <=0.25 SENSITIVE Sensitive     NITROFURANTOIN <=16 SENSITIVE Sensitive     TRIMETH/SULFA >=320 RESISTANT Resistant     AMPICILLIN/SULBACTAM 16 INTERMEDIATE Intermediate     PIP/TAZO <=4 SENSITIVE Sensitive     * >=100,000 COLONIES/mL ESCHERICHIA COLI     Labs: BNP (last 3 results) No results for input(s): BNP in the last 8760 hours. Basic Metabolic Panel: Recent Labs  Lab 05/29/21 1615 05/30/21 0057  NA 138 138  K 2.9* 3.6  CL 103 108  CO2 24 24  GLUCOSE 119* 115*  BUN 13 12  CREATININE 0.94 0.63  CALCIUM 8.9 8.1*  MG 1.6* 2.2   Liver Function Tests: Recent Labs  Lab 05/29/21 1615 05/30/21 0057 06/01/21 0502  AST 154* 66* 23  ALT 109* 75* 46*  ALKPHOS 139* 101 124  BILITOT 1.2 1.2 0.7  PROT 7.3 6.0* 6.5  ALBUMIN 4.1 3.1* 3.0*   No results for input(s): LIPASE, AMYLASE in the last 168 hours. No results for input(s): AMMONIA in the last 168 hours. CBC: Recent Labs  Lab 05/29/21 1615 05/30/21 0057 06/01/21 0502  WBC 6.9 7.0 5.2  NEUTROABS 5.6  --   --   HGB 13.2 10.7* 10.9*  HCT 37.0 31.0* 30.8*  MCV 85.3 85.6 84.4  PLT 188 175 190   Cardiac Enzymes: No results for input(s): CKTOTAL, CKMB, CKMBINDEX, TROPONINI in the last 168 hours. BNP: Invalid input(s): POCBNP CBG: No results for input(s): GLUCAP in the last 168 hours. D-Dimer No results for input(s): DDIMER in the last 72 hours. Hgb A1c No results for input(s): HGBA1C in the last 72 hours. Lipid Profile No results for input(s): CHOL, HDL, LDLCALC, TRIG, CHOLHDL, LDLDIRECT in the last 72 hours. Thyroid function studies No results for input(s): TSH, T4TOTAL, T3FREE, THYROIDAB in the last 72 hours.  Invalid input(s): FREET3 Anemia work up No results for input(s): VITAMINB12, FOLATE, FERRITIN, TIBC, IRON, RETICCTPCT in the last 72 hours. Urinalysis    Component Value Date/Time   COLORURINE YELLOW 05/29/2021 1615    APPEARANCEUR CLEAR 05/29/2021 1615   APPEARANCEUR  Cloudy 06/22/2012 2146   LABSPEC 1.010 05/29/2021 1615   LABSPEC 1.026 06/22/2012 2146   PHURINE 6.0 05/29/2021 1615   GLUCOSEU NEGATIVE 05/29/2021 1615   GLUCOSEU Negative 06/22/2012 2146   HGBUR MODERATE (A) 05/29/2021 1615   BILIRUBINUR NEGATIVE 05/29/2021 1615   BILIRUBINUR neg 03/05/2021 1603   BILIRUBINUR Negative 06/22/2012 2146   KETONESUR NEGATIVE 05/29/2021 1615   PROTEINUR 30 (A) 05/29/2021 1615   UROBILINOGEN 0.2 03/11/2021 1427   NITRITE POSITIVE (A) 05/29/2021 1615   LEUKOCYTESUR SMALL (A) 05/29/2021 1615   LEUKOCYTESUR Trace 06/22/2012 2146   Sepsis Labs Invalid input(s): PROCALCITONIN,  WBC,  LACTICIDVEN Microbiology Recent Results (from the past 240 hour(s))  Culture, blood (routine x 2)     Status: None (Preliminary result)   Collection Time: 05/29/21  4:15 PM   Specimen: BLOOD  Result Value Ref Range Status   Specimen Description BLOOD RIGHT ANTECUBITAL  Final   Special Requests   Final    BOTTLES DRAWN AEROBIC AND ANAEROBIC Blood Culture results may not be optimal due to an excessive volume of blood received in culture bottles   Culture   Final    NO GROWTH 3 DAYS Performed at [facility], [address] [place], [place] [address]    Report Status PENDING  Incomplete  Culture, blood (routine x 2)     Status: None (Preliminary result)   Collection Time: 05/29/21  4:15 PM   Specimen: BLOOD  Result Value Ref Range Status   Specimen Description BLOOD LEFT ANTECUBITAL  Final   Special Requests   Final    BOTTLES DRAWN AEROBIC AND ANAEROBIC Blood Culture adequate volume   Culture   Final    NO GROWTH 3 DAYS Performed at [facility], [address] [place], [place] [address]    Report Status PENDING  Incomplete  Resp Panel by RT-PCR (Flu A&B, Covid) Nasopharyngeal Swab     Status: None   Collection Time: 05/29/21  4:15 PM   Specimen: Nasopharyngeal Swab; Nasopharyngeal(NP) swabs in  vial transport medium  Result Value Ref Range Status   SARS Coronavirus 2 by RT PCR NEGATIVE NEGATIVE Final    Comment: (NOTE) SARS-CoV-2 target nucleic acids are NOT DETECTED.  The SARS-CoV-2 RNA is generally detectable in upper respiratory specimens during the acute phase of infection. The lowest concentration of SARS-CoV-2 viral copies this assay can detect is 138 copies/mL. A negative result does not preclude SARS-Cov-2 infection and should not be used as the sole basis for treatment or other patient management decisions. A negative result may occur with  improper specimen collection/handling, submission of specimen other than nasopharyngeal swab, presence of viral mutation(s) within the areas targeted by this assay, and inadequate number of viral copies(<138 copies/mL). A negative result must be combined with clinical observations, patient history, and epidemiological information. The expected result is Negative.  Fact Sheet for Patients:  BloggerCourse.com  Fact Sheet for Healthcare Providers:  SeriousBroker.it  This test is no t yet approved or cleared by the [place] FDA and  has been authorized for detection and/or diagnosis of SARS-CoV-2 by FDA under an Emergency Use Authorization (EUA). This EUA will remain  in effect (meaning this test can be used) for the duration of the COVID-19 declaration under Section 564(b)(1) of the Act, 21 U.S.C.section 360bbb-3(b)(1), unless the authorization is terminated  or revoked sooner.       Influenza A by PCR NEGATIVE NEGATIVE Final   Influenza B by PCR NEGATIVE NEGATIVE Final  Comment: (NOTE) The Xpert Xpress SARS-CoV-2/FLU/RSV plus assay is intended as an aid in the diagnosis of influenza from Nasopharyngeal swab specimens and should not be used as a sole basis for treatment. Nasal washings and aspirates are unacceptable for Xpert Xpress SARS-CoV-2/FLU/RSV testing.  Fact  Sheet for Patients: BloggerCourse.com  Fact Sheet for Healthcare Providers: SeriousBroker.it  This test is not yet approved or cleared by the [place] FDA and has been authorized for detection and/or diagnosis of SARS-CoV-2 by FDA under an Emergency Use Authorization (EUA). This EUA will remain in effect (meaning this test can be used) for the duration of the COVID-19 declaration under Section 564(b)(1) of the Act, 21 U.S.C. section 360bbb-3(b)(1), unless the authorization is terminated or revoked.  Performed at [facility], [address] [place], [place] [address]   Urine Culture     Status: Abnormal   Collection Time: 05/29/21  4:15 PM   Specimen: Urine, Clean Catch  Result Value Ref Range Status   Specimen Description   Final    URINE, CLEAN CATCH Performed at [facility], [address], [place], [place] [address]    Special Requests   Final    NONE Performed at [facility], [address] [place], [place] [address]    Culture >=100,000 COLONIES/mL ESCHERICHIA COLI (A)  Final   Report Status 05/31/2021 FINAL  Final   Organism ID, Bacteria ESCHERICHIA COLI (A)  Final      Susceptibility   Escherichia coli - MIC*    AMPICILLIN >=32 RESISTANT Resistant     CEFAZOLIN <=4 SENSITIVE Sensitive     CEFEPIME <=0.12 SENSITIVE Sensitive     CEFTRIAXONE <=0.25 SENSITIVE Sensitive     CIPROFLOXACIN <=0.25 SENSITIVE Sensitive     GENTAMICIN <=1 SENSITIVE Sensitive     IMIPENEM <=0.25 SENSITIVE Sensitive     NITROFURANTOIN <=16 SENSITIVE Sensitive     TRIMETH/SULFA >=320 RESISTANT Resistant     AMPICILLIN/SULBACTAM 16 INTERMEDIATE Intermediate     PIP/TAZO <=4 SENSITIVE Sensitive     * >=100,000 COLONIES/mL ESCHERICHIA COLI     Time coordinating discharge: Over 30 minutes  SIGNED:   Lynn Ito, MD  [facility] 06/01/2021, 12:33 PM Pager   If 7PM-7AM, please contact  night-coverage [email or web address] Password [email or web address]

## 2021-06-01 NOTE — Progress Notes (Signed)
Patient is being discharged home.  Discharge papers given and explained to patient.  She verbalized understanding.  Meds and f/u appointments reviewed.  Rx sent electronically to pharmacy.  Patient made aware.

## 2021-06-03 LAB — CULTURE, BLOOD (ROUTINE X 2)
Culture: NO GROWTH
Culture: NO GROWTH
Special Requests: ADEQUATE

## 2021-06-06 ENCOUNTER — Encounter: Payer: Self-pay | Admitting: Certified Nurse Midwife

## 2021-06-06 ENCOUNTER — Other Ambulatory Visit: Payer: Self-pay

## 2021-06-06 ENCOUNTER — Other Ambulatory Visit (HOSPITAL_COMMUNITY)
Admission: RE | Admit: 2021-06-06 | Discharge: 2021-06-06 | Disposition: A | Discharge status: Home / Self Care | Payer: Medicaid Other | Source: Ambulatory Visit | Attending: Certified Nurse Midwife | Admitting: Certified Nurse Midwife

## 2021-06-06 ENCOUNTER — Ambulatory Visit (INDEPENDENT_AMBULATORY_CARE_PROVIDER_SITE_OTHER): Payer: Medicaid Other | Admitting: Certified Nurse Midwife

## 2021-06-06 VITALS — BP 98/65 | HR 73 | Ht 62.0 in | Wt 132.7 lb

## 2021-06-06 DIAGNOSIS — Z30017 Encounter for initial prescription of implantable subdermal contraceptive: Secondary | ICD-10-CM | POA: Diagnosis not present

## 2021-06-06 DIAGNOSIS — Z87440 Personal history of urinary (tract) infections: Secondary | ICD-10-CM

## 2021-06-06 DIAGNOSIS — Z113 Encounter for screening for infections with a predominantly sexual mode of transmission: Secondary | ICD-10-CM

## 2021-06-06 DIAGNOSIS — Z30019 Encounter for initial prescription of contraceptives, unspecified: Secondary | ICD-10-CM

## 2021-06-06 NOTE — Progress Notes (Signed)
Natasha Sloan is a 30 y.o. year old Caucasian female here for Nexplanon insertion.  No LMP recorded., last sexual intercourse was prior to delivery. She is breastfeeding. Risks/benefits/side effects of Nexplanon have been discussed and her questions have been answered.  Specifically, a failure rate of 08/998 has been reported, with an increased failure rate if pt takes St. John's Wort and/or antiseizure medicaitons.  [name] is aware of the common side effect of irregular bleeding, which the incidence of decreases over time.  BP 98/65   Pulse 73   Ht 5\' 2"  (1.575 m)   Wt 132 lb 11.2 oz (60.2 kg)   Breastfeeding Yes   BMI 24.27 kg/m   No results found for this or any previous visit (from the past 24 hour(s)).   She is right-handed, so her left arm, approximately 4 inches proximal from the elbow, was cleansed with alcohol and anesthetized with 2cc of 2% Lidocaine.  The area was cleansed again with betadine and the Nexplanon was inserted per manufacturer's recommendations without difficulty.  A steri-strip and pressure bandage were applied.  Pt was instructed to keep the area clean and dry, remove pressure bandage in 24 hours, and keep insertion site covered with the steri-strip for 3-5 days.  Back up contraception was recommended for 2 weeks.  She was given a card indicating date Nexplanon was inserted and date it needs to be removed. Follow-up PRN problems. She is requesting swab today for screening. Urine culture completed for TOC.    Massey Ruhland,CNM

## 2021-06-06 NOTE — Patient Instructions (Signed)
Nexplanon Instructions After Insertion  Keep bandage clean and dry for 24 hours  May use ice/Tylenol/Ibuprofen for soreness or pain  If you develop fever, drainage or increased warmth from incision site-contact office immediately   

## 2021-06-09 ENCOUNTER — Other Ambulatory Visit: Payer: Self-pay | Admitting: Certified Nurse Midwife

## 2021-06-09 LAB — CERVICOVAGINAL ANCILLARY ONLY
Bacterial Vaginitis (gardnerella): POSITIVE — AB
Candida Glabrata: NEGATIVE
Candida Vaginitis: NEGATIVE
Chlamydia: NEGATIVE
Comment: NEGATIVE
Comment: NEGATIVE
Comment: NEGATIVE
Comment: NEGATIVE
Comment: NEGATIVE
Comment: NORMAL
Neisseria Gonorrhea: NEGATIVE
Trichomonas: NEGATIVE

## 2021-06-09 LAB — URINE CULTURE: Organism ID, Bacteria: NO GROWTH

## 2021-06-09 MED ORDER — METRONIDAZOLE 500 MG PO TABS: 500.0000 mg | ORAL_TABLET | Freq: Two times a day (BID) | ORAL | 0 refills | Status: AC

## 2021-07-10 DIAGNOSIS — R519 Headache, unspecified: Secondary | ICD-10-CM | POA: Diagnosis not present

## 2021-07-10 DIAGNOSIS — J101 Influenza due to other identified influenza virus with other respiratory manifestations: Secondary | ICD-10-CM | POA: Diagnosis not present

## 2021-07-10 DIAGNOSIS — H6691 Otitis media, unspecified, right ear: Secondary | ICD-10-CM | POA: Diagnosis not present

## 2021-07-10 DIAGNOSIS — Z20822 Contact with and (suspected) exposure to covid-19: Secondary | ICD-10-CM | POA: Diagnosis not present

## 2021-07-17 ENCOUNTER — Other Ambulatory Visit: Payer: Self-pay

## 2021-07-17 ENCOUNTER — Encounter: Payer: Self-pay | Admitting: Family Medicine

## 2021-07-17 ENCOUNTER — Ambulatory Visit (INDEPENDENT_AMBULATORY_CARE_PROVIDER_SITE_OTHER): Payer: Medicaid Other | Admitting: Family Medicine

## 2021-07-17 VITALS — BP 98/62 | HR 77 | Ht 61.0 in | Wt 138.4 lb

## 2021-07-17 DIAGNOSIS — J019 Acute sinusitis, unspecified: Secondary | ICD-10-CM

## 2021-07-17 DIAGNOSIS — B9689 Other specified bacterial agents as the cause of diseases classified elsewhere: Secondary | ICD-10-CM

## 2021-07-17 MED ORDER — PROMETHAZINE-DM 6.25-15 MG/5ML PO SYRP
2.5000 mL | ORAL_SOLUTION | Freq: Four times a day (QID) | ORAL | 0 refills | Status: DC | PRN
Start: 2021-07-17 — End: 2021-08-08

## 2021-07-17 NOTE — Assessment & Plan Note (Signed)
Previously noted and diagnosed by outside urgent care, initiated on cefdinir, has taken a few days worth of doses without significant improvement or worsening.  Her current complaints are primarily congestion and right ear pain.  Physical examination does reveal fluid behind the right tympanic membrane, without overt erythema at the tympanic membrane or ear canal, there is erythema and swelling in the right nares, less so at the left, left ear exam benign, there is tender right posterior cervical lymphadenopathy, oropharynx benign.  Her cardiopulmonary findings are benign as well.  I have advised continuation of her current antibiotic regimen, adjunct care with Mucinex, Flonase, and as needed promethazine-DM.  She has confirmed that she is not currently nursing and understands to hold from this while on medications to minimize risk.  She will return for follow-up for annual physical as scheduled.

## 2021-07-17 NOTE — Patient Instructions (Signed)
-   Finish out remaining antibiotics for full course - Start Mucinex twice daily x7 days - Use Flonase, 2 sprays in each nostril daily x7 days - Can use Phenergan DM up to 4 times a day on an as-needed basis for additional cough control - Review information provided, drink plenty of water, get plenty of rest - Continue to hold from nursing - Return for follow-up in 4-6 weeks for annual physical

## 2021-07-17 NOTE — Progress Notes (Signed)
     Primary Care / Sports Medicine Office Visit  Patient Information:  Patient ID: Natasha Sloan, female DOB: 04-28-1991 Age: 30 y.o. MRN: [id number]   Natasha Sloan is a pleasant 30 y.o. female presenting with the following:  Chief Complaint  Patient presents with   New Patient (Initial Visit)   Establish Care   Sinus Problem    X1 week dry cough, chest tightness, lymph node swelling, congestion; seen by [facility] 07/15/21 and received course of antibiotics    Patient Active Problem List   Diagnosis Date Noted   Acute bacterial rhinosinusitis 07/17/2021   Sepsis due to urinary tract infection (HCC) 05/30/2021   Severe sepsis with acute organ dysfunction (HCC) 05/29/2021   Complicated UTI (urinary tract infection) 05/29/2021   Hypokalemia 05/29/2021   Hypomagnesemia 05/29/2021   Elevated LFTs 05/29/2021   Normal labor 03/13/2021   Vaginal delivery    [redacted] weeks gestation of pregnancy    Labor and delivery, indication for care 03/12/2021   History of postpartum hemorrhage 02/27/2021   Hx of maternal laceration, sulcus tear, currently pregnant, third trimester 02/27/2021   GBS bacteriuria 01/03/2021   Spotting affecting pregnancy in third trimester 12/30/2020   Rubella non-immune status, antepartum 09/30/2020   Late prenatal care 09/27/2020   History of chlamydia 09/14/2020   Vaginal bleeding in pregnancy 09/14/2020   Type O blood, Rh positive 09/13/2020   R/O Posttraumatic stress disorder 03/29/2018   Asthma 09/09/2011    Vitals:   07/17/21 1459  BP: 98/62  Pulse: 77  SpO2: 98%   Vitals:   07/17/21 1459  Weight: 138 lb 6.4 oz (62.8 kg)  Height: 5\' 1"  (1.549 m)   Body mass index is 26.15 kg/m.  No results found.   Independent interpretation of notes and tests performed by another provider:   None  Procedures performed:   None  Pertinent History, Exam, Impression, and Recommendations:   Acute bacterial rhinosinusitis Previously noted and diagnosed by  outside urgent care, initiated on cefdinir, has taken a few days worth of doses without significant improvement or worsening.  Her current complaints are primarily congestion and right ear pain.  Physical examination does reveal fluid behind the right tympanic membrane, without overt erythema at the tympanic membrane or ear canal, there is erythema and swelling in the right nares, less so at the left, left ear exam benign, there is tender right posterior cervical lymphadenopathy, oropharynx benign.  Her cardiopulmonary findings are benign as well.  I have advised continuation of her current antibiotic regimen, adjunct care with Mucinex, Flonase, and as needed promethazine-DM.  She has confirmed that she is not currently nursing and understands to hold from this while on medications to minimize risk.  She will return for follow-up for annual physical as scheduled.   Orders & Medications Meds ordered this encounter  Medications   promethazine-dextromethorphan (PROMETHAZINE-DM) 6.25-15 MG/5ML syrup    Sig: Take 2.5 mLs by mouth 4 (four) times daily as needed for cough.    Dispense:  118 mL    Refill:  0   No orders of the defined types were placed in this encounter.    Return in about 4 weeks (around 08/14/2021) for 4-6 weeks, annual physical.     08/16/2021, MD   Primary Care Sports Medicine [facility] [place]

## 2021-08-07 ENCOUNTER — Encounter: Payer: Self-pay | Admitting: Obstetrics and Gynecology

## 2021-08-07 ENCOUNTER — Other Ambulatory Visit: Payer: Self-pay

## 2021-08-07 ENCOUNTER — Ambulatory Visit (INDEPENDENT_AMBULATORY_CARE_PROVIDER_SITE_OTHER): Payer: Medicaid Other | Admitting: Obstetrics and Gynecology

## 2021-08-07 VITALS — BP 115/84 | HR 81 | Ht 61.0 in | Wt 140.1 lb

## 2021-08-07 DIAGNOSIS — R399 Unspecified symptoms and signs involving the genitourinary system: Secondary | ICD-10-CM

## 2021-08-07 DIAGNOSIS — N898 Other specified noninflammatory disorders of vagina: Secondary | ICD-10-CM | POA: Diagnosis not present

## 2021-08-07 NOTE — Progress Notes (Signed)
    GYNECOLOGY PROGRESS NOTE  Subjective:    Patient ID: Natasha Sloan, female    DOB: April 29, 1991, 30 y.o.   MRN: [id number]  HPI  Patient is a 30 y.o. [id number] female who presents for complaints of urinary discomfort, burning that started 4 days ago.  Also noted a lump on the edge of her vagina last week, has gotten smaller over time but initially was bulging. Was not painful, denies and drainage from the lesion.    The following portions of the patient's history were reviewed and updated as appropriate: allergies, current medications, past family history, past medical history, past social history, past surgical history, and problem list.  Review of Systems Pertinent items noted in HPI and remainder of comprehensive ROS otherwise negative.   Objective:   Blood pressure 115/84, pulse 81, height 5\' 1"  (1.549 m), weight 140 lb 1.6 oz (63.5 kg), last menstrual period 07/25/2021, not currently breastfeeding. Body mass index is 26.47 kg/m. General appearance: alert and no distress Abdomen: soft, non-tender; bowel sounds normal; no masses,  no organomegaly Pelvic: external genitalia normal, rectovaginal septum normal.  Vagina without discharge.  Cervix normal appearing, no lesions and no motion tenderness.  Uterus mobile, nontender, normal shape and size.  Adnexae non-palpable, nontender bilaterally.    Labs:  Results for orders placed or performed in visit on 08/07/21  POCT Urinalysis Dipstick  Result Value Ref Range   Color, UA     Clarity, UA     Glucose, UA Negative Negative   Bilirubin, UA Negative    Ketones, UA Negative    Spec Grav, UA 1.015 1.010 - 1.025   Blood, UA Small    pH, UA 7.5 5.0 - 8.0   Protein, UA Negative Negative   Urobilinogen, UA 0.2 0.2 or 1.0 E.U./dL   Nitrite, UA Negative    Leukocytes, UA Negative Negative   Appearance     Odor      Assessment:   1. Vaginal lesion   2. UTI symptoms     Plan:   - Normal genitalia noted on exam today, lesion  likely has resolved.  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  Advised that if it returns to notify MD for appointment.  - UTI symptoms, however UA overall normal with small amount of blood. Advised on increasing water intake and can utilize AZO.  - Call or return to clinic prn if these symptoms worsen or fail to improve as anticipated.   14/08/22, MD [facility]

## 2021-08-08 LAB — POCT URINALYSIS DIPSTICK
Bilirubin, UA: NEGATIVE
Glucose, UA: NEGATIVE
Ketones, UA: NEGATIVE
Leukocytes, UA: NEGATIVE
Nitrite, UA: NEGATIVE
Protein, UA: NEGATIVE
Spec Grav, UA: 1.015 (ref 1.010–1.025)
Urobilinogen, UA: 0.2 E.U./dL
pH, UA: 7.5 (ref 5.0–8.0)

## 2021-08-14 ENCOUNTER — Other Ambulatory Visit: Payer: Self-pay

## 2021-08-14 ENCOUNTER — Ambulatory Visit (INDEPENDENT_AMBULATORY_CARE_PROVIDER_SITE_OTHER): Payer: Medicaid Other | Admitting: Family Medicine

## 2021-08-14 ENCOUNTER — Encounter: Payer: Self-pay | Admitting: Family Medicine

## 2021-08-14 VITALS — BP 110/78 | HR 102 | Ht 61.0 in | Wt 138.0 lb

## 2021-08-14 DIAGNOSIS — E559 Vitamin D deficiency, unspecified: Secondary | ICD-10-CM

## 2021-08-14 DIAGNOSIS — Z0000 Encounter for general adult medical examination without abnormal findings: Secondary | ICD-10-CM | POA: Insufficient documentation

## 2021-08-14 DIAGNOSIS — Z13220 Encounter for screening for lipoid disorders: Secondary | ICD-10-CM

## 2021-08-14 NOTE — Progress Notes (Signed)
Annual Physical Exam Visit  Patient Information:  Patient ID: Natasha Sloan, female DOB: March 08, 1991 Age: 30 y.o. MRN: [id number]   Subjective:   CC: Annual Physical Exam  HPI:  Natasha Sloan is here for their annual physical.  I reviewed the past medical history, family history, social history, surgical history, and allergies today and changes were made as necessary.  Please see the problem list section below for additional details.  Past Medical History: Past Medical History:  Diagnosis Date   Anxiety    Asthma    History of anemia    History of asthma    History of urinary tract infection    Past Surgical History: Past Surgical History:  Procedure Laterality Date   EYE SURGERY Left 2007   "remove clot"   ROOT CANAL     x1   WISDOM TOOTH EXTRACTION Bilateral 02/2021   Family History: Family History  Problem Relation Age of Onset   Breast cancer Mother    Diabetes Mother    Diabetes Father    Allergies: No Known Allergies Health Maintenance: Health Maintenance  Topic Date Due   PAP-Cervical Cytology Screening  12/28/2021 (Originally 03/18/2021)   PAP SMEAR-Modifier  12/28/2021 (Originally 03/18/2021)   Pneumococcal Vaccine 34-57 Years old (2 - PCV) 05/30/2022   TETANUS/TDAP  12/31/2030   INFLUENZA VACCINE  Completed   Hepatitis C Screening  Completed   HIV Screening  Completed   HPV VACCINES  Aged Out    HM Colonoscopy     This patient has no relevant Health Maintenance data.      Medications: Current Outpatient Medications on File Prior to Visit  Medication Sig Dispense Refill   albuterol (VENTOLIN HFA) 108 (90 Base) MCG/ACT inhaler Inhale 2 puffs into the lungs every 6 (six) hours as needed for wheezing or shortness of breath. 8 g 2   No current facility-administered medications on file prior to visit.    Review of Systems: No headache, visual changes, nausea, vomiting, diarrhea, constipation, dizziness, abdominal pain, skin rash, fevers, chills,  night sweats, swollen lymph nodes, weight loss, chest pain, body aches, joint swelling, muscle aches, shortness of breath, mood changes, visual or auditory hallucinations reported.  Objective:   Vitals:   08/14/21 1444  BP: 110/78  Pulse: (!) 102  SpO2: 99%   Vitals:   08/14/21 1444  Weight: 138 lb (62.6 kg)  Height: 5\' 1"  (1.549 m)   Body mass index is 26.07 kg/m.  General: Well Developed, well nourished, and in no acute distress.  Neuro: Alert and oriented x3, extra-ocular muscles intact, sensation grossly intact. Cranial nerves II through XII are grossly intact, motor, sensory, and coordinative functions are intact. HEENT: Normocephalic, atraumatic, pupils equal round reactive to light, neck supple, no masses, no lymphadenopathy, thyroid nonpalpable. Oropharynx, nasopharynx, external ear canals are unremarkable. Skin: Warm and dry, no rashes noted.  Cardiac: Regular rate and rhythm, no murmurs rubs or gallops. No peripheral edema. Pulses symmetric. Respiratory: Clear to auscultation bilaterally. Not using accessory muscles, speaking in full sentences.  Abdominal: Soft, nontender, nondistended, positive bowel sounds, no masses, no organomegaly. Musculoskeletal: Shoulder, elbow, wrist, hip, knee, ankle stable, and with full range of motion.  Female chaperone initials: BN present throughout the physical examination.  Impression and Recommendations:   The patient was counselled, risk factors were discussed, and anticipatory guidance given.  Annual physical exam Annual examination completed, risk stratification labs ordered, anticipatory guidance provided.  We will follow labs once resulted.   Orders &  Medications Medications: No orders of the defined types were placed in this encounter.  Orders Placed This Encounter  Procedures   TSH Rfx on Abnormal to Free T4   CBC with Differential/Platelet   Comprehensive metabolic panel   Lipid panel   VITAMIN D 25 Hydroxy (Vit-D  Deficiency, Fractures)     Return in about 1 year (around 08/14/2022) for Annual physical.    Jerrol Banana, MD   Primary Care Sports Medicine [facility] [place]

## 2021-08-14 NOTE — Assessment & Plan Note (Signed)
Annual examination completed, risk stratification labs ordered, anticipatory guidance provided.  We will follow labs once resulted. 

## 2021-08-14 NOTE — Patient Instructions (Signed)
-   Obtain fasting labs with orders provided (can have water or black coffee but otherwise no food or drink x 8 hours before labs) °- Review information provided °- Attend eye doctor annually, dentist every 6 months, work towards or maintain 30 minutes of moderate intensity physical activity at least 5 days per week, and consume a balanced diet °- Return in 1 year for physical °- Contact us for any questions between now and then °

## 2021-08-18 ENCOUNTER — Ambulatory Visit: Payer: Medicaid Other | Admitting: Family Medicine

## 2021-08-25 ENCOUNTER — Encounter: Payer: Self-pay | Admitting: Family Medicine

## 2021-08-26 ENCOUNTER — Other Ambulatory Visit: Payer: Self-pay

## 2021-08-26 ENCOUNTER — Encounter: Payer: Self-pay | Admitting: Family Medicine

## 2021-08-26 ENCOUNTER — Ambulatory Visit (INDEPENDENT_AMBULATORY_CARE_PROVIDER_SITE_OTHER): Payer: Medicaid Other | Admitting: Family Medicine

## 2021-08-26 VITALS — BP 110/82 | HR 100 | Temp 98.1°F | Ht 61.0 in | Wt 136.0 lb

## 2021-08-26 DIAGNOSIS — J029 Acute pharyngitis, unspecified: Secondary | ICD-10-CM | POA: Diagnosis not present

## 2021-08-26 LAB — POCT RAPID STREP A (OFFICE): Rapid Strep A Screen: NEGATIVE

## 2021-08-26 LAB — POCT INFLUENZA A/B
Influenza A, POC: NEGATIVE
Influenza B, POC: NEGATIVE

## 2021-08-26 LAB — POC COVID19 BINAXNOW: SARS Coronavirus 2 Ag: NEGATIVE

## 2021-08-26 MED ORDER — AZITHROMYCIN 250 MG PO TABS: ORAL_TABLET | ORAL | 0 refills | Status: AC

## 2021-08-26 MED ORDER — PROMETHAZINE-DM 6.25-15 MG/5ML PO SYRP
2.5000 mL | ORAL_SOLUTION | Freq: Four times a day (QID) | ORAL | 0 refills | Status: AC | PRN
Start: 2021-08-26 — End: 2021-08-31

## 2021-08-26 NOTE — Telephone Encounter (Signed)
Please schedule virtual or in-person visit at 4:20 pm.  Please advise patient to mask when in close contact with baby, others, and to wash hands often (avoid any secretions from eye, nose, mouth contacting baby, others). If baby becomes symptomatic, touch base with pediatrician.

## 2021-08-26 NOTE — Telephone Encounter (Signed)
Called patient to schedule and advise.  No answer.  Voicemail full.

## 2021-08-26 NOTE — Progress Notes (Signed)
Primary Care / Sports Medicine Office Visit  Patient Information:  Patient ID: Natasha Sloan, female DOB: [birth year] Age: 30 y.o. MRN: [id number]   Natasha Sloan is a pleasant 30 y.o. female presenting with the following:  Chief Complaint  Patient presents with   Sore Throat    Bilateral sides   Cough    Productive; started 08/25/21; using inhaler with relief   Conjunctivitis    Started 08/21/21; using over the counter eye drops   Ear Pain    Bilateral ears and swollen lymph nodes associated   Emesis    Last night; no fever associated    Patient Active Problem List   Diagnosis Date Noted   Pharyngitis 08/26/2021   Annual physical exam 08/14/2021   Acute bacterial rhinosinusitis 07/17/2021   Sepsis due to urinary tract infection (HCC) 05/30/2021   Severe sepsis with acute organ dysfunction (HCC) 05/29/2021   Complicated UTI (urinary tract infection) 05/29/2021   Hypokalemia 05/29/2021   Hypomagnesemia 05/29/2021   Elevated LFTs 05/29/2021   Normal labor 03/13/2021   Vaginal delivery    [redacted] weeks gestation of pregnancy    Labor and delivery, indication for care 03/12/2021   History of postpartum hemorrhage 02/27/2021   Hx of maternal laceration, sulcus tear, currently pregnant, third trimester 02/27/2021   GBS bacteriuria 01/03/2021   Spotting affecting pregnancy in third trimester 12/30/2020   Rubella non-immune status, antepartum 09/30/2020   Late prenatal care 09/27/2020   History of chlamydia 09/14/2020   Vaginal bleeding in pregnancy 09/14/2020   Type O blood, Rh positive 09/13/2020   R/O Posttraumatic stress disorder 03/29/2018   Asthma 09/09/2011    Vitals:   08/26/21 1622  BP: 110/82  Pulse: 100  Temp: 98.1 F (36.7 C)  SpO2: 99%   Vitals:   08/26/21 1622  Weight: 136 lb (61.7 kg)  Height: 5\' 1"  (1.549 m)   Body mass index is 25.7 kg/m.  No results found.   Independent interpretation of notes and tests performed by another provider:    None  Procedures performed:   None  Pertinent History, Exam, Impression, and Recommendations:   Pharyngitis Patient wearing a roughly 5-day history of progressive worsened cough, nasal congestion, injected sclera and copious purulent discharge about the eyes.  Additionally, she has noted nausea, nonbloody/nonbilious emesis, and anorexia.  He does state that prior to onset her family member with similar symptoms had visited.  She denies any fevers or chills, has been taking rest and over-the-counter eyedrops.  Physical examination reveals injected sclera, scant straw-colored about the inferior lashes bilaterally, oropharynx injected with cobblestone pattern, bilateral tympanic membranes with fluid present, canals benign, shotty lymphadenopathy about the neck, clear lung fields throughout, tachycardic rate, otherwise no arrhythmia, no additional heart sounds.  We did test the patient at a point-of-care standpoint for strep, flu, and COVID.  The results were negative.  Possible concern for adenovirus given constellation of symptoms however given the copious purulent eye discharge, bacterial component cannot be excluded.  Given the acuity of symptoms, broad involvement, plan for azithromycin course, Phenergan-DM for supportive care, intranasal steroid, saline spray, graded diet advance, and for patient to touch base in 7 days if symptoms fail to improve.   Orders & Medications Meds ordered this encounter  Medications   azithromycin (ZITHROMAX) 250 MG tablet    Sig: Take 2 tablets on day 1, then 1 tablet daily on days 2 through 5    Dispense:  6 tablet    Refill:  0   promethazine-dextromethorphan (PROMETHAZINE-DM) 6.25-15 MG/5ML syrup    Sig: Take 2.5 mLs by mouth 4 (four) times daily as needed for up to 5 days for cough.    Dispense:  118 mL    Refill:  0   Orders Placed This Encounter  Procedures   POCT Influenza A/B   POC COVID-19   POCT rapid strep A     Return if symptoms  worsen or fail to improve.     Jerrol Banana, MD   Primary Care Sports Medicine [facility]

## 2021-08-26 NOTE — Assessment & Plan Note (Signed)
Patient wearing a roughly 5-day history of progressive worsened cough, nasal congestion, injected sclera and copious purulent discharge about the eyes.  Additionally, she has noted nausea, nonbloody/nonbilious emesis, and anorexia.  He does state that prior to onset her family member with similar symptoms had visited.  She denies any fevers or chills, has been taking rest and over-the-counter eyedrops.  Physical examination reveals injected sclera, scant straw-colored about the inferior lashes bilaterally, oropharynx injected with cobblestone pattern, bilateral tympanic membranes with fluid present, canals benign, shotty lymphadenopathy about the neck, clear lung fields throughout, tachycardic rate, otherwise no arrhythmia, no additional heart sounds.  We did test the patient at a point-of-care standpoint for strep, flu, and COVID.  The results were negative.  Possible concern for adenovirus given constellation of symptoms however given the copious purulent eye discharge, bacterial component cannot be excluded.  Given the acuity of symptoms, broad involvement, plan for azithromycin course, Phenergan-DM for supportive care, intranasal steroid, saline spray, graded diet advance, and for patient to touch base in 7 days if symptoms fail to improve.

## 2021-08-26 NOTE — Patient Instructions (Signed)
-   Dose of the muscle for full course - Can dose cough medicine to 4 times a day on an as-needed basis - Use saline nasal spray throughout the day and prior to Flonase - Perform 2 sprays of Flonase (fluticasone propionate) in each nostril x7 days then as needed - Focus on fluids (electrolyte rich such as Gatorade, etc.), can advance to easy to digest foods (soups, broths), - Recommend daily vitamin C and vitamin D supplementation - Contact our office in 7 days for any symptoms of fail to improve - Follow-up as needed

## 2021-08-26 NOTE — Telephone Encounter (Signed)
Please advise.  Only appointment available today is 4:20 PM.

## 2021-10-01 ENCOUNTER — Ambulatory Visit: Payer: Medicaid Other | Admitting: Family Medicine

## 2021-10-02 ENCOUNTER — Other Ambulatory Visit: Payer: Self-pay

## 2021-10-02 ENCOUNTER — Encounter: Payer: Self-pay | Admitting: Family Medicine

## 2021-10-02 ENCOUNTER — Ambulatory Visit (INDEPENDENT_AMBULATORY_CARE_PROVIDER_SITE_OTHER): Payer: Medicaid Other | Admitting: Family Medicine

## 2021-10-02 VITALS — BP 118/84 | HR 78 | Temp 97.9°F | Ht 61.0 in | Wt 140.0 lb

## 2021-10-02 DIAGNOSIS — J45901 Unspecified asthma with (acute) exacerbation: Secondary | ICD-10-CM | POA: Diagnosis not present

## 2021-10-02 DIAGNOSIS — J329 Chronic sinusitis, unspecified: Secondary | ICD-10-CM

## 2021-10-02 DIAGNOSIS — R319 Hematuria, unspecified: Secondary | ICD-10-CM

## 2021-10-02 DIAGNOSIS — R300 Dysuria: Secondary | ICD-10-CM | POA: Diagnosis not present

## 2021-10-02 HISTORY — DX: Chronic sinusitis, unspecified: J32.9

## 2021-10-02 LAB — POCT URINALYSIS DIPSTICK
Bilirubin, UA: NEGATIVE
Glucose, UA: NEGATIVE
Ketones, UA: NEGATIVE
Leukocytes, UA: NEGATIVE
Nitrite, UA: NEGATIVE
Protein, UA: NEGATIVE
Spec Grav, UA: 1.015 (ref 1.010–1.025)
Urobilinogen, UA: 0.2 E.U./dL
pH, UA: 6 (ref 5.0–8.0)

## 2021-10-02 LAB — POC COVID19 BINAXNOW: SARS Coronavirus 2 Ag: NEGATIVE

## 2021-10-02 LAB — POCT INFLUENZA A/B
Influenza A, POC: NEGATIVE
Influenza B, POC: NEGATIVE

## 2021-10-02 LAB — POCT RAPID STREP A (OFFICE): Rapid Strep A Screen: NEGATIVE

## 2021-10-02 MED ORDER — DULERA 100-5 MCG/ACT IN AERO: 2.0000 | INHALATION_SPRAY | Freq: Two times a day (BID) | RESPIRATORY_TRACT | 2 refills | Status: DC

## 2021-10-02 MED ORDER — ALBUTEROL SULFATE HFA 108 (90 BASE) MCG/ACT IN AERS: 2.0000 | INHALATION_SPRAY | Freq: Four times a day (QID) | RESPIRATORY_TRACT | 2 refills | Status: DC | PRN

## 2021-10-02 MED ORDER — PROMETHAZINE-DM 6.25-15 MG/5ML PO SYRP: 5.0000 mL | ORAL_SOLUTION | Freq: Four times a day (QID) | ORAL | 0 refills | Status: DC | PRN

## 2021-10-02 MED ORDER — METHYLPREDNISOLONE 4 MG PO TBPK: ORAL_TABLET | ORAL | 0 refills | Status: DC

## 2021-10-02 NOTE — Patient Instructions (Signed)
-   Take prednisone for full course - Start Dulera - Use albuterol as-needed - Use Rx cough medicine as-needed - Start OTC guaifenesin (regular Mucinex) x 10 days - Start OTC Claritin or Allegra x 10 days - Referral coordinator will contact you to schedule ENT visit - Contact us next week to provide an update

## 2021-10-02 NOTE — Assessment & Plan Note (Signed)
Patient with longstanding history of asthma, previously considered mild and intermittent, has noted progressive worsening since pregnancy and significant worsening since 09/28/2021 where she has noted progressive wheeze, dyspnea, and productive cough.  Examination today reveals ill-appearing patient without respiratory distress, HEENT document elsewhere, lung fields do show equal air entry throughout with expiratory wheeze, no rales, no rhonchi, no accessory muscle usage noted.  Her saturations are 9 9% on room air.  Patient has asthma exacerbation with comorbid rhinosinusitis, given recent course of antibiotics, may represent viral etiology versus sequelae of recent infection.  I have advised a Medrol Dosepak steroid course, initiation of Dulera, continue as needed albuterol, Rx antitussive to be used as needed, and OTC supportive care options.  Chronic condition, exacerbation, Rx management

## 2021-10-02 NOTE — Assessment & Plan Note (Addendum)
Patient presents with roughly 4-day noted worsening symptoms involving congestion, productive cough, shortness of air, fatigue, body aches, nausea, chills, and diarrhea.  Of note, during her last visit on 08/26/2021, concern was for bacterial pharyngitis, was treated with azithromycin course which provided improvement followed by recurrence of symptoms now with dyspnea.  Findings today do reveal swollen turbinates, asymmetric swelling, mild erythema, oropharynx without exudate, mild injection, tympanic membranes and ear canals bilaterally benign, mild tenderness about the left frontal, ethmoid, maxillary sinuses, shotty cervical lymphadenopathy bilaterally.  Patient has had recurrent episodes of nasopharyngeal related symptomatology in the setting of comorbid asthma with exacerbation noted today (See additional assessments for details).  She has been placed on a course of oral steroids, as needed antitussive, scheduled OTC Mucinex and antihistamine of her choosing.  Additionally, I have advised the benefit of further evaluation by ENT and a referral was placed today in that regard.

## 2021-10-02 NOTE — Progress Notes (Signed)
Primary Care / Sports Medicine Office Visit  Patient Information:  Patient ID: Natasha Sloan, female DOB: 06/24/1991 Age: 31 y.o. MRN: [id number]   Natasha Sloan is a pleasant 31 y.o. female presenting with the following:  Chief Complaint  Patient presents with   Sinus Problem   Cough    Recurrent from last visit 08/26/21 and has been more severe since 09/28/2021; productive, nausea, chills, body aches, and diarrhea associated    Vitals:   10/02/21 0909  BP: 118/84  Pulse: 78  Temp: 97.9 F (36.6 C)  SpO2: 99%   Vitals:   10/02/21 0909  Weight: 140 lb (63.5 kg)  Height: 5\' 1"  (1.549 m)   Body mass index is 26.45 kg/m.  No results found.   Independent interpretation of notes and tests performed by another provider:   None  Procedures performed:   None  Pertinent History, Exam, Impression, and Recommendations:   Recurrent rhinosinusitis Patient presents with roughly 4-day noted worsening symptoms involving congestion, productive cough, shortness of air, fatigue, body aches, nausea, chills, and diarrhea.  Of note, during her last visit on 08/26/2021, concern was for bacterial pharyngitis, was treated with azithromycin course which provided improvement followed by recurrence of symptoms now with dyspnea.  Findings today do reveal swollen turbinates, asymmetric swelling, mild erythema, oropharynx without exudate, mild injection, tympanic membranes and ear canals bilaterally benign, mild tenderness about the left frontal, ethmoid, maxillary sinuses, shotty cervical lymphadenopathy bilaterally.  Patient has had recurrent episodes of nasopharyngeal related symptomatology in the setting of comorbid asthma with exacerbation noted today (See additional assessments for details).  She has been placed on a course of oral steroids, as needed antitussive, scheduled OTC Mucinex and antihistamine of her choosing.  Additionally, I have advised the benefit of further evaluation by ENT  and a referral was placed today in that regard.  Asthma Patient with longstanding history of asthma, previously considered mild and intermittent, has noted progressive worsening since pregnancy and significant worsening since 09/28/2021 where she has noted progressive wheeze, dyspnea, and productive cough.  Examination today reveals ill-appearing patient without respiratory distress, HEENT document elsewhere, lung fields do show equal air entry throughout with expiratory wheeze, no rales, no rhonchi, no accessory muscle usage noted.  Her saturations are 9 9% on room air.  Patient has asthma exacerbation with comorbid rhinosinusitis, given recent course of antibiotics, may represent viral etiology versus sequelae of recent infection.  I have advised a Medrol Dosepak steroid course, initiation of Dulera, continue as needed albuterol, Rx antitussive to be used as needed, and OTC supportive care options.  Chronic condition, exacerbation, Rx management   Orders & Medications Meds ordered this encounter  Medications   albuterol (VENTOLIN HFA) 108 (90 Base) MCG/ACT inhaler    Sig: Inhale 2 puffs into the lungs every 6 (six) hours as needed for wheezing or shortness of breath.    Dispense:  8 g    Refill:  2   methylPREDNISolone (MEDROL DOSEPAK) 4 MG TBPK tablet    Sig: Take 6 tabs PO daily then 5 tabs PO daily then 4 tabs PO daily then 3 tabs PO daily then 2 tabs PO daily then 1 tab PO daily    Dispense:  21 tablet    Refill:  0   mometasone-formoterol (DULERA) 100-5 MCG/ACT AERO    Sig: Inhale 2 puffs into the lungs in the morning and at bedtime.    Dispense:  1 each    Refill:  2  promethazine-dextromethorphan (PROMETHAZINE-DM) 6.25-15 MG/5ML syrup    Sig: Take 5 mLs by mouth 4 (four) times daily as needed for cough.    Dispense:  118 mL    Refill:  0   Orders Placed This Encounter  Procedures   Urine Culture   Ambulatory referral to ENT   POCT Urinalysis Dipstick   POCT rapid strep A    POCT Influenza A/B   POC COVID-19     Return if symptoms worsen or fail to improve.     Montel Culver, MD   Primary Care Sports Medicine [place]

## 2021-10-04 LAB — URINE CULTURE

## 2021-10-04 LAB — SPECIMEN STATUS REPORT

## 2021-11-12 DIAGNOSIS — R0981 Nasal congestion: Secondary | ICD-10-CM | POA: Diagnosis not present

## 2021-11-12 DIAGNOSIS — J309 Allergic rhinitis, unspecified: Secondary | ICD-10-CM | POA: Diagnosis not present

## 2021-11-14 LAB — FETAL NONSTRESS TEST

## 2021-11-25 DIAGNOSIS — E559 Vitamin D deficiency, unspecified: Secondary | ICD-10-CM | POA: Diagnosis not present

## 2021-11-25 DIAGNOSIS — R7309 Other abnormal glucose: Secondary | ICD-10-CM | POA: Diagnosis not present

## 2021-11-25 DIAGNOSIS — Z0000 Encounter for general adult medical examination without abnormal findings: Secondary | ICD-10-CM | POA: Diagnosis not present

## 2021-11-25 DIAGNOSIS — Z13220 Encounter for screening for lipoid disorders: Secondary | ICD-10-CM | POA: Diagnosis not present

## 2021-11-25 DIAGNOSIS — J301 Allergic rhinitis due to pollen: Secondary | ICD-10-CM | POA: Diagnosis not present

## 2021-11-26 ENCOUNTER — Other Ambulatory Visit: Payer: Self-pay | Admitting: Family Medicine

## 2021-11-26 DIAGNOSIS — R7989 Other specified abnormal findings of blood chemistry: Secondary | ICD-10-CM

## 2021-11-26 LAB — COMPREHENSIVE METABOLIC PANEL
ALT: 21 IU/L (ref 0–32)
AST: 20 IU/L (ref 0–40)
Albumin/Globulin Ratio: 1.5 (ref 1.2–2.2)
Albumin: 3.8 g/dL — ABNORMAL LOW (ref 3.9–5.0)
Alkaline Phosphatase: 91 IU/L (ref 44–121)
BUN/Creatinine Ratio: 17 (ref 9–23)
BUN: 10 mg/dL (ref 6–20)
Bilirubin Total: 0.3 mg/dL (ref 0.0–1.2)
CO2: 23 mmol/L (ref 20–29)
Calcium: 8.2 mg/dL — ABNORMAL LOW (ref 8.7–10.2)
Chloride: 106 mmol/L (ref 96–106)
Creatinine, Ser: 0.59 mg/dL (ref 0.57–1.00)
Globulin, Total: 2.6 g/dL (ref 1.5–4.5)
Glucose: 107 mg/dL — ABNORMAL HIGH (ref 70–99)
Potassium: 3.9 mmol/L (ref 3.5–5.2)
Sodium: 140 mmol/L (ref 134–144)
Total Protein: 6.4 g/dL (ref 6.0–8.5)
eGFR: 126 mL/min/{1.73_m2} (ref >59)

## 2021-11-26 LAB — CBC WITH DIFFERENTIAL/PLATELET
Basophils Absolute: 0 10*3/uL (ref 0.0–0.2)
Basos: 0 %
EOS (ABSOLUTE): 0.3 10*3/uL (ref 0.0–0.4)
Eos: 6 %
Hematocrit: 34.8 % (ref 34.0–46.6)
Hemoglobin: 12 g/dL (ref 11.1–15.9)
Immature Grans (Abs): 0 10*3/uL (ref 0.0–0.1)
Immature Granulocytes: 0 %
Lymphocytes Absolute: 1.1 10*3/uL (ref 0.7–3.1)
Lymphs: 20 %
MCH: 29.3 pg (ref 26.6–33.0)
MCHC: 34.5 g/dL (ref 31.5–35.7)
MCV: 85 fL (ref 79–97)
Monocytes Absolute: 0.4 10*3/uL (ref 0.1–0.9)
Monocytes: 8 %
Neutrophils Absolute: 3.5 10*3/uL (ref 1.4–7.0)
Neutrophils: 66 %
Platelets: 254 10*3/uL (ref 150–450)
RBC: 4.1 x10E6/uL (ref 3.77–5.28)
RDW: 13.8 % (ref 11.7–15.4)
WBC: 5.3 10*3/uL (ref 3.4–10.8)

## 2021-11-26 LAB — LIPID PANEL
Chol/HDL Ratio: 3.9 ratio (ref 0.0–4.4)
Cholesterol, Total: 140 mg/dL (ref 100–199)
HDL: 36 mg/dL — ABNORMAL LOW (ref >39)
LDL Chol Calc (NIH): 63 mg/dL (ref 0–99)
Triglycerides: 253 mg/dL — ABNORMAL HIGH (ref 0–149)
VLDL Cholesterol Cal: 41 mg/dL — ABNORMAL HIGH (ref 5–40)

## 2021-11-26 LAB — TSH RFX ON ABNORMAL TO FREE T4: TSH: 1.69 u[IU]/mL (ref 0.450–4.500)

## 2021-11-26 LAB — VITAMIN D 25 HYDROXY (VIT D DEFICIENCY, FRACTURES): Vit D, 25-Hydroxy: 8.8 ng/mL — ABNORMAL LOW (ref 30.0–100.0)

## 2021-11-26 MED ORDER — VITAMIN D (ERGOCALCIFEROL) 1.25 MG (50000 UNIT) PO CAPS
50000.0000 [IU] | ORAL_CAPSULE | ORAL | 0 refills | Status: DC
Start: 2021-11-26 — End: 2022-09-24

## 2021-11-27 DIAGNOSIS — J343 Hypertrophy of nasal turbinates: Secondary | ICD-10-CM | POA: Diagnosis not present

## 2021-11-27 DIAGNOSIS — R0981 Nasal congestion: Secondary | ICD-10-CM | POA: Diagnosis not present

## 2021-11-27 DIAGNOSIS — J309 Allergic rhinitis, unspecified: Secondary | ICD-10-CM | POA: Diagnosis not present

## 2021-11-27 DIAGNOSIS — J329 Chronic sinusitis, unspecified: Secondary | ICD-10-CM | POA: Diagnosis not present

## 2021-12-03 LAB — HGB A1C W/O EAG: Hgb A1c MFr Bld: 5.4 % (ref 4.8–5.6)

## 2021-12-03 LAB — SPECIMEN STATUS REPORT

## 2021-12-17 ENCOUNTER — Encounter: Payer: Self-pay | Admitting: Unknown Physician Specialty

## 2021-12-22 NOTE — Discharge Instructions (Signed)
?[facility] ?[facility] ?ENDOSCOPIC SINUS SURGERY ?[facility] ? ?What is Functional Endoscopic Sinus Surgery? ? The Surgery involves making the natural openings of the sinuses larger by removing the bony partitions that separate the sinuses from the nasal cavity.  The natural sinus lining is preserved as much as possible to allow the sinuses to resume normal function after the surgery.  In some patients nasal polyps (excessively swollen lining of the sinuses) may be removed to relieve obstruction of the sinus openings.  The surgery is performed through the nose using lighted scopes, which eliminates the need for incisions on the face.  A septoplasty is a different procedure which is sometimes performed with sinus surgery.  It involves straightening the boy partition that separates the two sides of your nose.  A crooked or deviated septum may need repair if is obstructing the sinuses or nasal airflow.  Turbinate reduction is also often performed during sinus surgery.  The turbinates are bony proturberances from the side walls of the nose which swell and can obstruct the nose in patients with sinus and allergy problems.  Their size can be surgically reduced to help relieve nasal obstruction. ? ?What Can Sinus Surgery Do For Me? ? Sinus surgery can reduce the frequency of sinus infections requiring antibiotic treatment.  This can provide improvement in nasal congestion, post-nasal drainage, facial pressure and nasal obstruction.  Surgery will NOT prevent you from ever having an infection again, so it usually only for patients who get infections 4 or more times yearly requiring antibiotics, or for infections that do not clear with antibiotics.  It will not cure nasal allergies, so patients with allergies may still require medication to treat their allergies after surgery. Surgery may improve headaches related to sinusitis, however, some people will continue to  require medication to control sinus headaches related to allergies.  Surgery will do nothing for other forms of headache (migraine, tension or cluster). ? ?What Are the Risks of Endoscopic Sinus Surgery? ? Current techniques allow surgery to be performed safely with little risk, however, there are rare complications that patients should be aware of.  Because the sinuses are located around the eyes, there is risk of eye injury, including blindness, though again, this would be quite rare. This is usually a result of bleeding behind the eye during surgery, which can effect vision, though there are treatments to protect the vision and prevent permanent injury. More serious complications would include bleeding inside the brain cavity or damage to the brain.This happens when the fluid around the brain leaks out into the sinus cavity.  Again, all of these complications are uncommon, and spinal fluid leaks can be safely managed surgically if they occur.  The most common complication of sinus surgery is bleeding from the nose, which may require packing or cauterization of the nose.  Patients with polyps may experience recurrence of the polyps that would require revision surgery.  Alterations of sense of smell or injury to the tear ducts are also rare complications.  ? ?What is the Surgery Like, and what is the Recovery? ? The Surgery usually takes a couple of hours to perform, and is usually performed under a general anesthetic (completely asleep).  Patients are usually discharged home after a couple of hours.  Sometimes during surgery it is necessary to pack the nose to control bleeding, and the packing is left in place for 24 - 48 hours, and removed by your surgeon.  If   a septoplasty was performed during the procedure, there is often a splint placed which must be removed after 5-7 days.   ?Discomfort: Pain is usually mild to moderate, and can be controlled by prescription pain medication or acetaminophen (Tylenol).   Aspirin, Ibuprofen (Advil, Motrin), or Naprosyn (Aleve) should be avoided, as they can cause increased bleeding.  Most patients feel sinus pressure like they have a bad head cold for several days.  Sleeping with your head elevated can help reduce swelling and facial pressure, as can ice packs over the face.  A humidifier may be helpful to keep the mucous and blood from drying in the nose.  ? ?Diet: There are no specific diet restrictions, however, you should generally start with clear liquids and a light diet of bland foods because the anesthetic can cause some nausea.  Advance your diet depending on how your stomach feels.  Taking your pain medication with food will often help reduce stomach upset which pain medications can cause. ? ?Nasal Saline Irrigation: It is important to remove blood clots and dried mucous from the nose as it is healing.  This is done by having you irrigate the nose at least 3 - 4 times daily with a salt water solution.  We recommend using NeilMed Sinus Rinse (available at the drug store).  Fill the squeeze bottle with the solution, bend over a sink, and insert the tip of the squeeze bottle into the nose ? of an inch.  Point the tip of the squeeze bottle towards the inside corner of the eye on the same side your irrigating.  Squeeze the bottle and gently irrigate the nose.  If you bend forward as you do this, most of the fluid will flow back out of the nose, instead of down your throat.   The solution should be warm, near body temperature, when you irrigate.   Each time you irrigate, you should use a full squeeze bottle.  ? ?Note that if you are instructed to use Nasal Steroid Sprays at any time after your surgery, irrigate with saline BEFORE using the steroid spray, so you do not wash it all out of the nose. ?Another product, Nasal Saline Gel (such as AYR Nasal Saline Gel) can be applied in each nostril 3 - 4 times daily to moisture the nose and reduce scabbing or crusting. ? ?Bleeding:   Bloody drainage from the nose can be expected for several days, and patients are instructed to irrigate their nose frequently with salt water to help remove mucous and blood clots.  The drainage may be dark red or brown, though some fresh blood may be seen intermittently, especially after irrigation.  Do not blow you nose, as bleeding may occur. If you must sneeze, keep your mouth open to allow air to escape through your mouth. ? ?If heavy bleeding occurs: Irrigate the nose with saline to rinse out clots, then spray the nose 3 - 4 times with Afrin Nasal Decongestant Spray.  The spray will constrict the blood vessels to slow bleeding.  Pinch the lower half of your nose shut to apply pressure, and lay down with your head elevated.  Ice packs over the nose may help as well. If bleeding persists despite these measures, you should notify your doctor.  Do not use the Afrin routinely to control nasal congestion after surgery, as it can result in worsening congestion and may affect healing.  ? ? ? ?Activity: Return to work varies among patients. Most patients will be out   of work at least 5 - 7 days to recover.  Patient may return to work after they are off of narcotic pain medication, and feeling well enough to perform the functions of their job.  Patients must avoid heavy lifting (over 10 pounds) or strenuous physical for 2 weeks after surgery, so your employer may need to assign you to light duty, or keep you out of work longer if light duty is not possible.  NOTE: you should not drive, operate dangerous machinery, do any mentally demanding tasks or make any important legal or financial decisions while on narcotic pain medication and recovering from the general anesthetic.  ?  ?Call Your Doctor Immediately if You Have Any of the Following: ?Bleeding that you cannot control with the above measures ?Loss of vision, double vision, bulging of the eye or black eyes. ?Fever over 101 degrees ?Neck stiffness with severe headache,  fever, nausea and change in mental state. ?You are always encouraged to call anytime with concerns, however, please call with requests for pain medication refills during office hours. ? ?Office Endoscopy: [facility]

## 2021-12-23 ENCOUNTER — Encounter: Payer: Medicaid Other | Admitting: Certified Nurse Midwife

## 2021-12-26 ENCOUNTER — Ambulatory Visit: Payer: Medicaid Other | Admitting: Anesthesiology

## 2021-12-26 ENCOUNTER — Other Ambulatory Visit: Payer: Self-pay

## 2021-12-26 ENCOUNTER — Encounter
Admission: RE | Disposition: A | Discharge status: Home / Self Care | Payer: Self-pay | Source: Home / Self Care | Attending: Unknown Physician Specialty

## 2021-12-26 ENCOUNTER — Ambulatory Visit
Admission: RE | Admit: 2021-12-26 | Discharge: 2021-12-26 | Disposition: A | Discharge status: Home / Self Care | Payer: Medicaid Other | Attending: Unknown Physician Specialty | Admitting: Unknown Physician Specialty

## 2021-12-26 ENCOUNTER — Encounter: Payer: Self-pay | Admitting: Unknown Physician Specialty

## 2021-12-26 DIAGNOSIS — J3489 Other specified disorders of nose and nasal sinuses: Secondary | ICD-10-CM | POA: Insufficient documentation

## 2021-12-26 DIAGNOSIS — J343 Hypertrophy of nasal turbinates: Secondary | ICD-10-CM | POA: Diagnosis not present

## 2021-12-26 DIAGNOSIS — J342 Deviated nasal septum: Secondary | ICD-10-CM | POA: Diagnosis not present

## 2021-12-26 HISTORY — PX: SEPTOPLASTY: SHX2393

## 2021-12-26 HISTORY — PX: NASAL TURBINATE REDUCTION: SHX2072

## 2021-12-26 IMAGING — CR DG CHEST 2V
1 series · 2 of 2 positions shown · non-contrast
Comparison: None.

CLINICAL DATA: Short of breath, fever, weakness

EXAM:
CHEST - 2 VIEW

[Series 1: dg chest 2 view · 0.14mm/px · 2 of 2 slices shown]
[im 1/2]
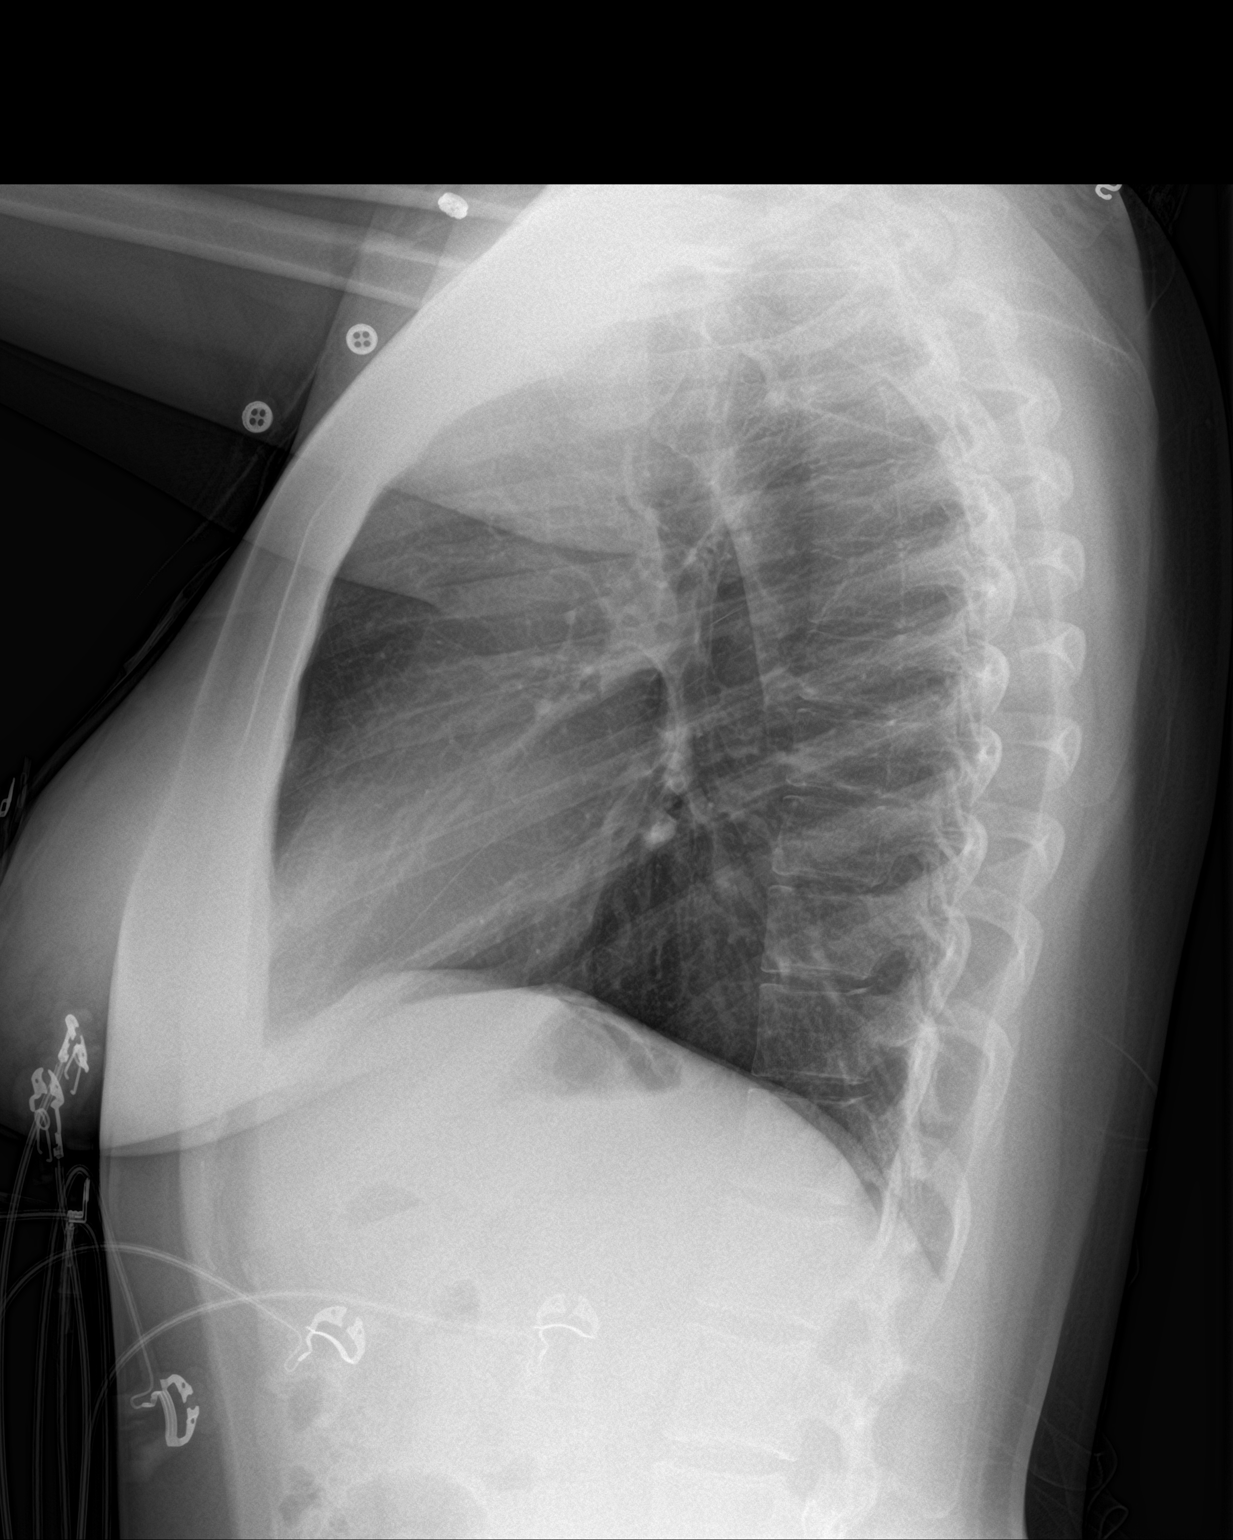
[im 2/2]
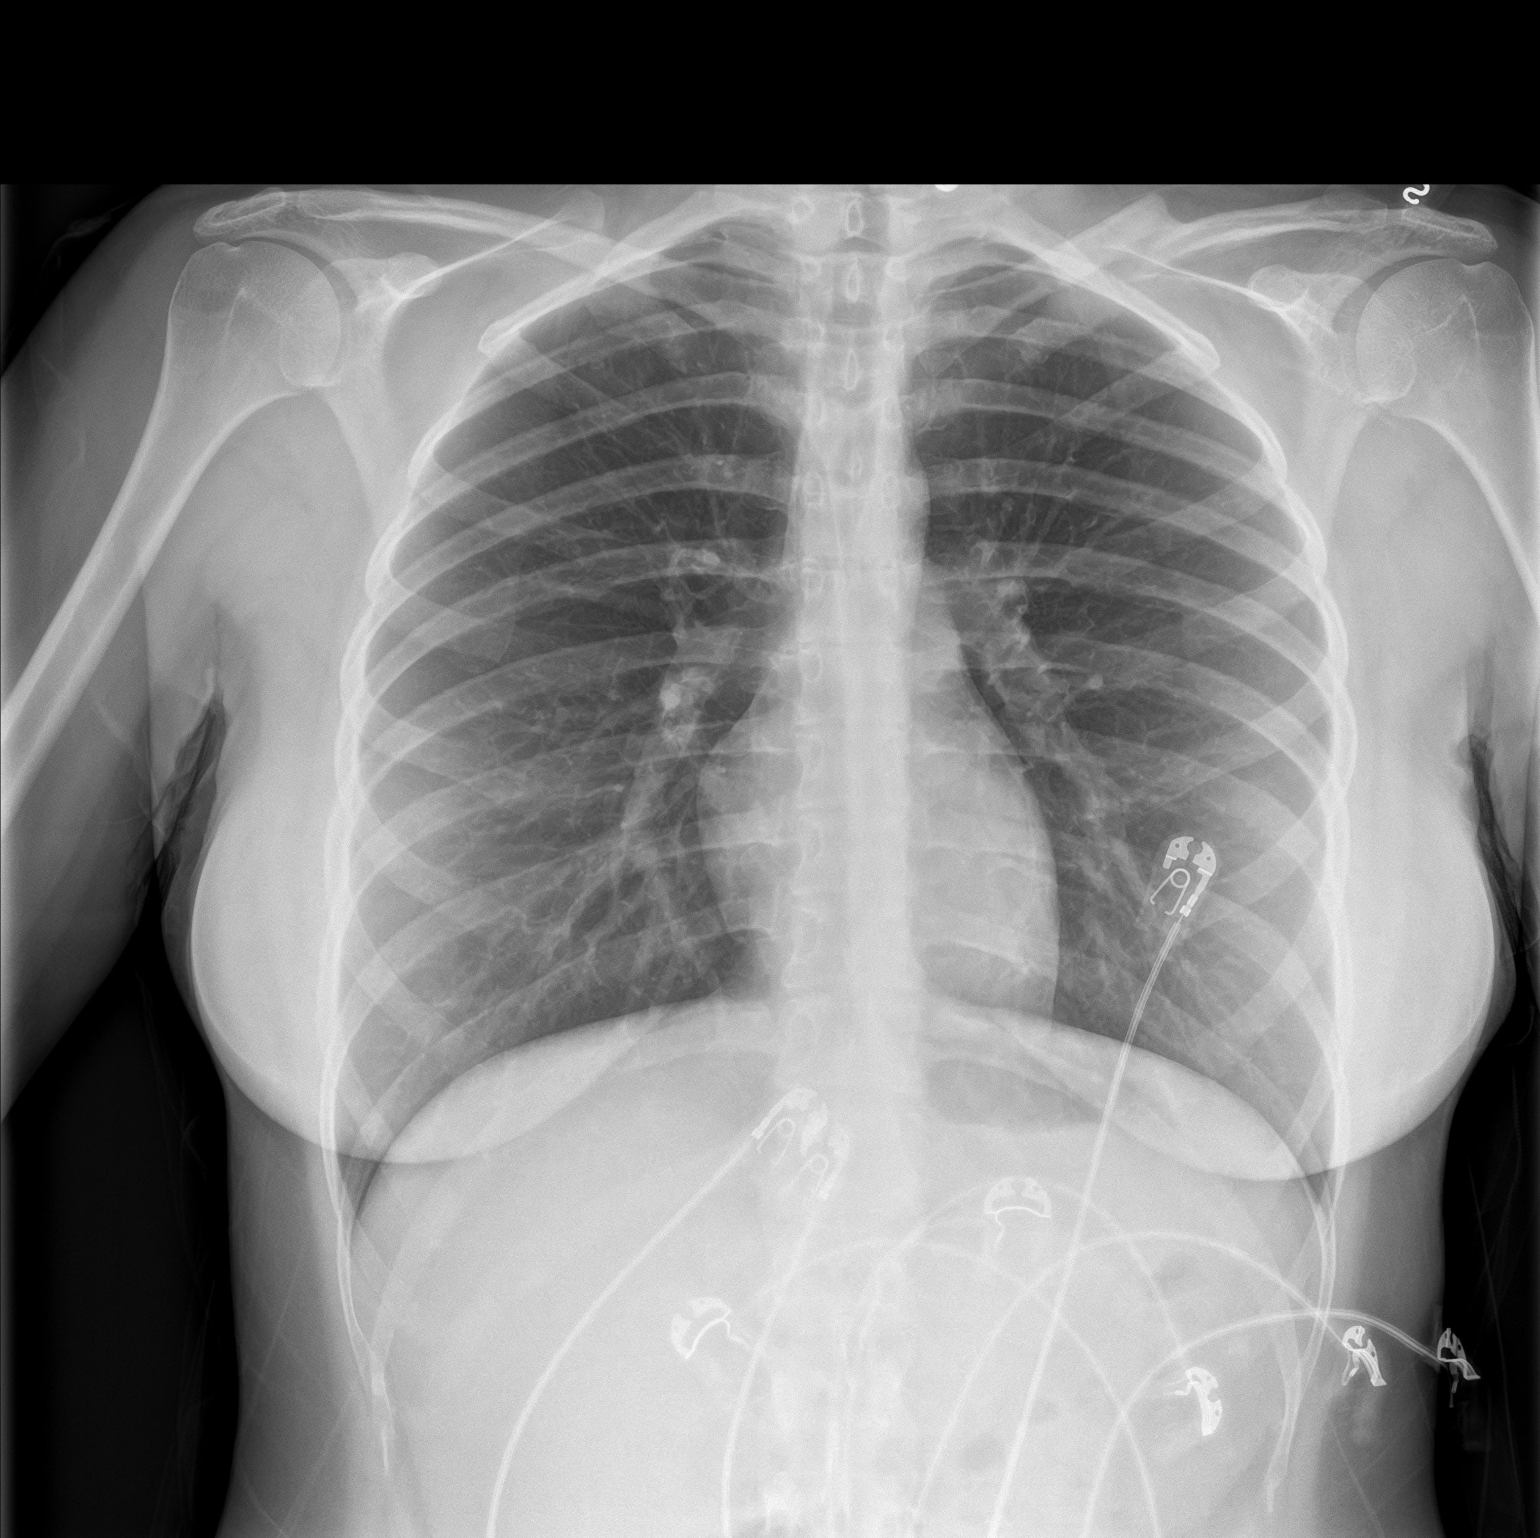

[2 of 2 positions shown; findings below may reference images not displayed]

FINDINGS: The heart size and mediastinal contours are within normal limits.
Both lungs are clear. The visualized skeletal structures are
unremarkable.
IMPRESSION: No active cardiopulmonary disease.

## 2021-12-26 SURGERY — SEPTOPLASTY, NOSE
Anesthesia: General | Site: Nose

## 2021-12-26 MED ORDER — SUCCINYLCHOLINE CHLORIDE 200 MG/10ML IV SOSY
PREFILLED_SYRINGE | INTRAVENOUS | Status: DC | PRN
  Administered 2021-12-26: 100 mg via INTRAVENOUS

## 2021-12-26 MED ORDER — PROPOFOL 10 MG/ML IV BOLUS
INTRAVENOUS | Status: DC | PRN
  Administered 2021-12-26: 150 mg via INTRAVENOUS

## 2021-12-26 MED ORDER — OXYCODONE HCL 5 MG PO TABS: 5.0000 mg | ORAL_TABLET | Freq: Once | ORAL | Status: AC | PRN

## 2021-12-26 MED ORDER — DEXMEDETOMIDINE (PRECEDEX) IN NS 20 MCG/5ML (4 MCG/ML) IV SYRINGE
PREFILLED_SYRINGE | INTRAVENOUS | Status: DC | PRN
  Administered 2021-12-26 (×2): 10 ug via INTRAVENOUS

## 2021-12-26 MED ORDER — SCOPOLAMINE 1 MG/3DAYS TD PT72
1.0000 | MEDICATED_PATCH | Freq: Once | TRANSDERMAL | Status: DC
  Administered 2021-12-26: 1.5 mg via TRANSDERMAL

## 2021-12-26 MED ORDER — SULFAMETHOXAZOLE-TRIMETHOPRIM 800-160 MG PO TABS: 1.0000 | ORAL_TABLET | Freq: Two times a day (BID) | ORAL | 0 refills | Status: DC

## 2021-12-26 MED ORDER — LACTATED RINGERS IV SOLN: INTRAVENOUS | Status: DC

## 2021-12-26 MED ORDER — OXYMETAZOLINE HCL 0.05 % NA SOLN
6.0000 | Freq: Once | NASAL | Status: AC
Start: 2021-12-26 — End: 2021-12-26
  Administered 2021-12-26: 6 via NASAL

## 2021-12-26 MED ORDER — PHENYLEPHRINE HCL 0.5 % NA SOLN
NASAL | Status: DC | PRN
  Administered 2021-12-26: 15 mL via TOPICAL

## 2021-12-26 MED ORDER — GLYCOPYRROLATE 0.2 MG/ML IJ SOLN
INTRAMUSCULAR | Status: DC | PRN
  Administered 2021-12-26: .2 mg via INTRAVENOUS

## 2021-12-26 MED ORDER — FENTANYL CITRATE PF 50 MCG/ML IJ SOSY
25.0000 ug | PREFILLED_SYRINGE | INTRAMUSCULAR | Status: DC | PRN
  Administered 2021-12-26: 25 ug via INTRAVENOUS

## 2021-12-26 MED ORDER — HYDROCODONE-ACETAMINOPHEN 5-300 MG PO TABS: 1.0000 | ORAL_TABLET | ORAL | 0 refills | Status: DC | PRN

## 2021-12-26 MED ORDER — LIDOCAINE HCL (CARDIAC) PF 100 MG/5ML IV SOSY
PREFILLED_SYRINGE | INTRAVENOUS | Status: DC | PRN
  Administered 2021-12-26: 40 mg via INTRAVENOUS

## 2021-12-26 MED ORDER — DEXAMETHASONE SODIUM PHOSPHATE 4 MG/ML IJ SOLN
INTRAMUSCULAR | Status: DC | PRN
  Administered 2021-12-26 (×2): 4 mg via INTRAVENOUS

## 2021-12-26 MED ORDER — ONDANSETRON HCL 4 MG/2ML IJ SOLN
INTRAMUSCULAR | Status: DC | PRN
  Administered 2021-12-26: 4 mg via INTRAVENOUS

## 2021-12-26 MED ORDER — LIDOCAINE-EPINEPHRINE 1 %-1:100000 IJ SOLN
INTRAMUSCULAR | Status: DC | PRN
  Administered 2021-12-26: 10 mL

## 2021-12-26 MED ORDER — MIDAZOLAM HCL 5 MG/5ML IJ SOLN
INTRAMUSCULAR | Status: DC | PRN
Start: 2021-12-26 — End: 2021-12-26
  Administered 2021-12-26: 2 mg via INTRAVENOUS

## 2021-12-26 MED ORDER — ACETAMINOPHEN 10 MG/ML IV SOLN
1000.0000 mg | Freq: Once | INTRAVENOUS | Status: AC
Start: 2021-12-26 — End: 2021-12-26
  Administered 2021-12-26: 1000 mg via INTRAVENOUS

## 2021-12-26 MED ORDER — OXYCODONE HCL 5 MG/5ML PO SOLN
5.0000 mg | Freq: Once | ORAL | Status: AC | PRN
  Administered 2021-12-26: 5 mg via ORAL

## 2021-12-26 MED ORDER — FENTANYL CITRATE (PF) 100 MCG/2ML IJ SOLN
INTRAMUSCULAR | Status: DC | PRN
  Administered 2021-12-26: 50 ug via INTRAVENOUS

## 2021-12-26 SURGICAL SUPPLY — 22 items
COAG SUCT 10F 3.5MM HAND CTRL (MISCELLANEOUS) ×3 IMPLANT
DRAPE HEAD BAR (DRAPES) ×3 IMPLANT
DRESSING NASL FOAM PST OP SINU (MISCELLANEOUS) IMPLANT
DRSG NASAL FOAM POST OP SINU (MISCELLANEOUS) ×3
ELECT REM PT RETURN 9FT ADLT (ELECTROSURGICAL) ×3
ELECTRODE REM PT RTRN 9FT ADLT (ELECTROSURGICAL) ×2 IMPLANT
GLOVE SURG ENC TEXT LTX SZ7.5 (GLOVE) ×6 IMPLANT
HANDLE YANKAUER SUCT BULB TIP (MISCELLANEOUS) ×3 IMPLANT
KIT TURNOVER KIT A (KITS) ×3 IMPLANT
NDL HYPO 25GX1X1/2 BEV (NEEDLE) ×2 IMPLANT
NEEDLE HYPO 25GX1X1/2 BEV (NEEDLE) ×3 IMPLANT
PACK ENT CUSTOM (PACKS) ×3 IMPLANT
SPLINT NASAL SEPTAL BLV .50 ST (MISCELLANEOUS) ×1 IMPLANT
SPONGE NEURO XRAY DETECT 1X3 (DISPOSABLE) ×3 IMPLANT
STRAP BODY AND KNEE 60X3 (MISCELLANEOUS) ×3 IMPLANT
SUT CHROMIC 3-0 (SUTURE) ×3
SUT CHROMIC 3-0 KS 27XMFL CR (SUTURE) ×2
SUT ETHILON 3-0 KS 30 BLK (SUTURE) ×3 IMPLANT
SUTURE CHRMC 3-0 KS 27XMFL CR (SUTURE) ×2 IMPLANT
SYR 10ML LL (SYRINGE) ×3 IMPLANT
TOWEL OR 17X26 4PK STRL BLUE (TOWEL DISPOSABLE) ×3 IMPLANT
WATER STERILE IRR 250ML POUR (IV SOLUTION) ×3 IMPLANT

## 2021-12-26 NOTE — Anesthesia Postprocedure Evaluation (Signed)
Anesthesia Post Note ? ?Patient: Natasha Sloan ? ?Procedure(s) Performed: SEPTOPLASTY (Nose) ?TURBINATE REDUCTION/SUBMUCOSAL RESECTION (Bilateral: Nose) ? ? ?  ?Patient location during evaluation: PACU ?Anesthesia Type: General ?Level of consciousness: awake ?Pain management: pain level controlled ?Vital Signs Assessment: post-procedure vital signs reviewed and stable ?Respiratory status: respiratory function stable ?Cardiovascular status: stable ?Postop Assessment: no signs of nausea or vomiting ?Anesthetic complications: no ? ? ?No notable events documented. ? ?Jola Babinski ? ? ? ? ? ?

## 2021-12-26 NOTE — Anesthesia Preprocedure Evaluation (Signed)
Anesthesia Evaluation  ?Patient identified by MRN, date of birth, ID band ?Patient awake ? ? ? ?Reviewed: ?Allergy & Precautions, NPO status  ? ?Airway ?Mallampati: II ? ?TM Distance: >3 FB ? ? ? ? Dental ?  ?Pulmonary ?asthma , neg recent URI,  ?  ?Pulmonary exam normal ? ? ? ? ? ? ? Cardiovascular ?negative cardio ROS ? ? ?Rhythm:Regular Rate:Normal ? ? ?  ?Neuro/Psych ?PSYCHIATRIC DISORDERS Anxiety   ? GI/Hepatic ?negative GI ROS,   ?Endo/Other  ? ? Renal/GU ?  ? ?  ?Musculoskeletal ? ? Abdominal ?  ?Peds ? Hematology ?  ?Anesthesia Other Findings ? ? Reproductive/Obstetrics ? ?  ? ? ? ? ? ? ? ? ? ? ? ? ? ?  ?  ? ? ? ? ? ? ? ? ?Anesthesia Physical ?Anesthesia Plan ? ?ASA: 2 ? ?Anesthesia Plan: General  ? ?Post-op Pain Management: Ofirmev IV (intra-op)  ? ?Induction: Intravenous ? ?PONV Risk Score and Plan: 3 and Ondansetron, Dexamethasone, Midazolam and Treatment may vary due to age or medical condition ? ?Airway Management Planned: Oral ETT ? ?Additional Equipment:  ? ?Intra-op Plan:  ? ?Post-operative Plan:  ? ?Informed Consent: I have reviewed the patients History and Physical, chart, labs and discussed the procedure including the risks, benefits and alternatives for the proposed anesthesia with the patient or authorized representative who has indicated his/her understanding and acceptance.  ? ? ? ?Dental advisory given ? ?Plan Discussed with: CRNA ? ?Anesthesia Plan Comments:   ? ? ? ? ? ? ?Anesthesia Quick Evaluation ? ?

## 2021-12-26 NOTE — Op Note (Signed)
PREOPERATIVE DIAGNOSIS:  Chronic nasal obstruction. ? ?POSTOPERATIVE DIAGNOSIS:  Chronic nasal obstruction. ? ?SURGEON:  Davina Poke, M.D. ? ?NAME OF PROCEDURE:  ?Nasal septoplasty. ?Submucous resection of inferior turbinates. ? ?OPERATIVE FINDINGS:  Severe nasal septal deformity, hypertrophy of the inferior turbinates.  ? ?DESCRIPTION OF THE PROCEDURE:  Natasha Sloan was identified in the holding area and taken to the operating room and placed in the supine position.  After general endotracheal anesthesia was induced, the table was turned 45 degrees and the patient was placed in a semi-Fowler position.  The nose was then topically anesthetized with Lidocaine, cotton pledgets were placed within each nostril. After approximately 5 minutes, this was removed at which time a local anesthetic of 1% Lidocaine 1:100,000 units of Epinephrine was used to inject the inferior turbinates in the nasal septum. A total of 10 ml was used. Examination of the nose showed a  left nasal septal deformity and tremendous hypertrophied inferior turbinate.  Beginning on the right hand side a hemitransfixion incision was then created on the leading edge of the septum on the right.  A subperichondrial plane was elevated posteriorly on the left and taken back to the perpendicular plate of the ethmoid where subperiosteal plane was elevated posteriorly on the left. A  septal spur was identified on the left hand side .  An inferior rim of cartilage was removed anteriorly with care taken to leave an anterior strut to prevent nasal collapse. With this strut removed the perpendicular plate of the ethmoid was separated from the quadrangular cartilage. The large septal spur was removed.  The septum was then replaced in the midline. Reinspection through each nostril showed excellent reduction of the septal deformity. A left posterior inferior fenestration was then created to allow hematoma drainage.  With the septoplasty completed, beginning on  the left-hand side, a 15 blade was used to incise along the inferior edge of the inferior turbinate. A superior laterally based flap was then elevated. The underlying conchal bone of mucosa was excised using Knight scissors. The flap was then laid back over the turbinate stump and cauterized using suction cautery. In a similar fashion the submucous resection was performed on the right.  With the submucous resection completed bilaterally and no active bleeding, the hemitransfixion incision was then closed using two interrupted 3-0 chromic sutures.  Plastic nasal septal splints were placed within each nostril and affixed to the septum using a 3-0 nylon suture. Stammberger was then used beneath each inferior turbinate for hemostasis.   ? ?The patient tolerated the procedure well, was returned to anesthesia, extubated in the operating room, and taken to the recovery room in stable condition.   ? ?CULTURES:  None. ? ?SPECIMENS:  None. ? ?ESTIMATED BLOOD LOSS:  25 cc. ? ?Davina Poke ? ?12/26/2021 ? ?12:54 PM ? ?  ?

## 2021-12-26 NOTE — Transfer of Care (Signed)
Immediate Anesthesia Transfer of Care Note ? ?Patient: Natasha Sloan ? ?Procedure(s) Performed: SEPTOPLASTY (Nose) ?TURBINATE REDUCTION/SUBMUCOSAL RESECTION (Bilateral: Nose) ? ?Patient Location: PACU ? ?Anesthesia Type: General ? ?Level of Consciousness: awake, alert  and patient cooperative ? ?Airway and Oxygen Therapy: Patient Spontanous Breathing and Patient connected to supplemental oxygen ? ?Post-op Assessment: Post-op Vital signs reviewed, Patient's Cardiovascular Status Stable, Respiratory Function Stable, Patent Airway and No signs of Nausea or vomiting ? ?Post-op Vital Signs: Reviewed and stable ? ?Complications: No notable events documented. ? ?

## 2021-12-26 NOTE — Anesthesia Procedure Notes (Signed)
Procedure Name: Intubation ?Date/Time: 12/26/2021 12:19 PM ?Performed by: Cameron Ali, CRNA ?Pre-anesthesia Checklist: Patient identified, Emergency Drugs available, Suction available, Patient being monitored and Timeout performed ?Patient Re-evaluated:Patient Re-evaluated prior to induction ?Oxygen Delivery Method: Circle system utilized ?Preoxygenation: Pre-oxygenation with 100% oxygen ?Induction Type: IV induction ?Ventilation: Mask ventilation without difficulty ?Laryngoscope Size: Mac and 3 ?Grade View: Grade I ?Tube type: Oral Dwyane Luo ?Tube size: 7.0 mm ?Number of attempts: 1 ?Placement Confirmation: ETT inserted through vocal cords under direct vision, positive ETCO2 and breath sounds checked- equal and bilateral ?Tube secured with: Tape ?Dental Injury: Teeth and Oropharynx as per pre-operative assessment  ? ? ? ? ?

## 2021-12-26 NOTE — H&P (Signed)
The patient's history has been reviewed, patient examined, no change in status, stable for surgery.  Questions were answered to the patients satisfaction.  

## 2021-12-27 IMAGING — US US ABDOMEN COMPLETE
1 series · 14 of 25 positions shown · non-contrast
Comparison: CT 02/06/2020

CLINICAL DATA: Elevated LFTs, sepsis

EXAM:
ABDOMEN ULTRASOUND COMPLETE

[Series 1: us abdomen complete · 14 of 87 slices shown]
[im 1/87]
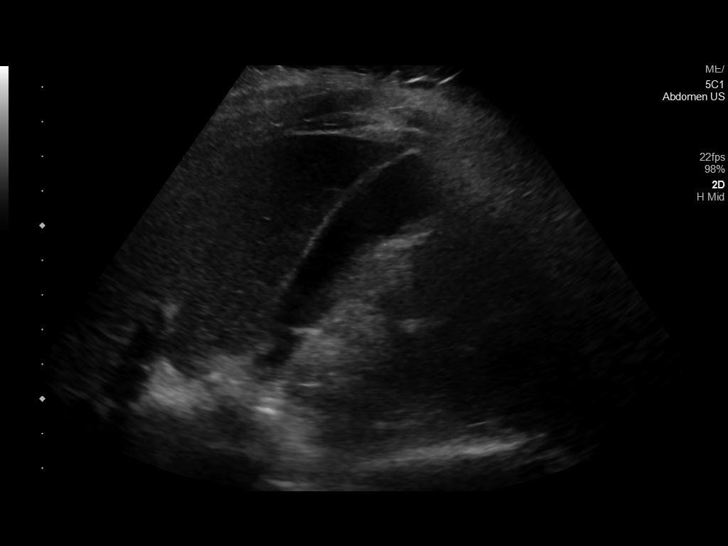
[im 8/87]
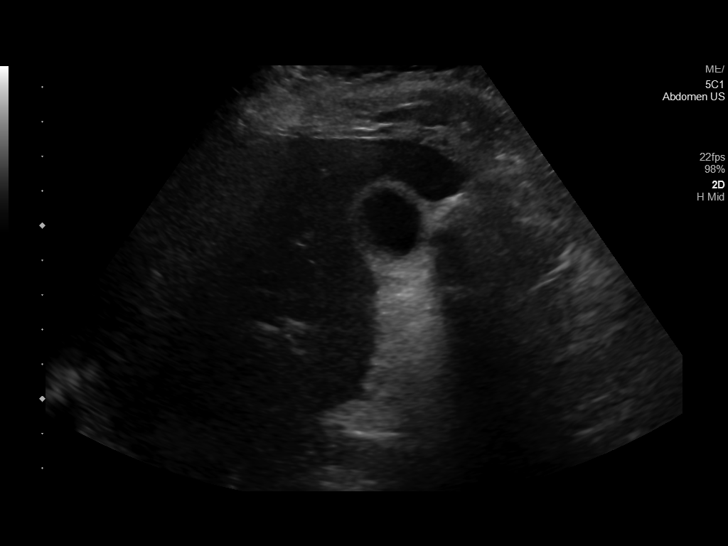
[im 15/87]
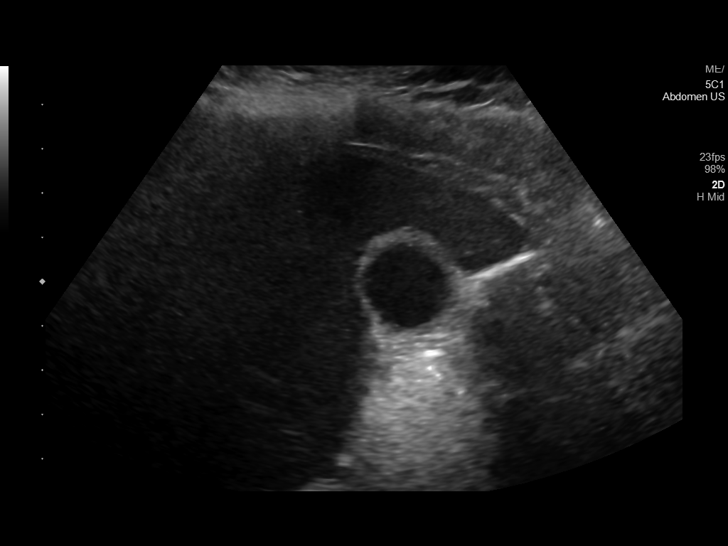
[im 22/87]
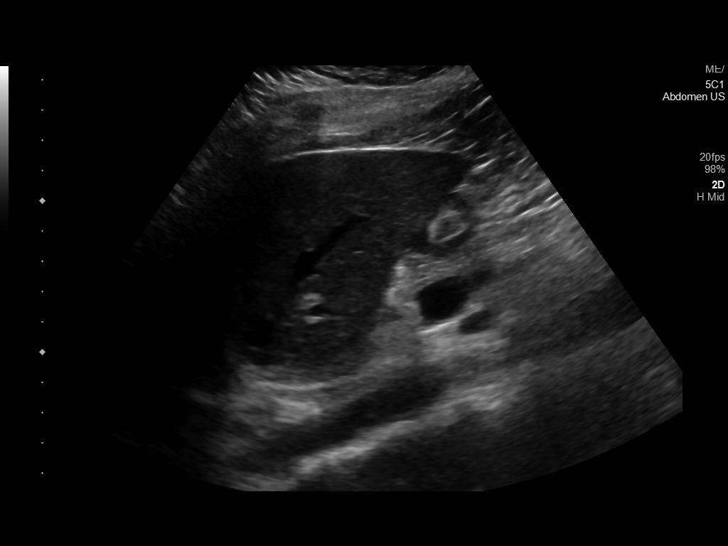
[im 29/87]
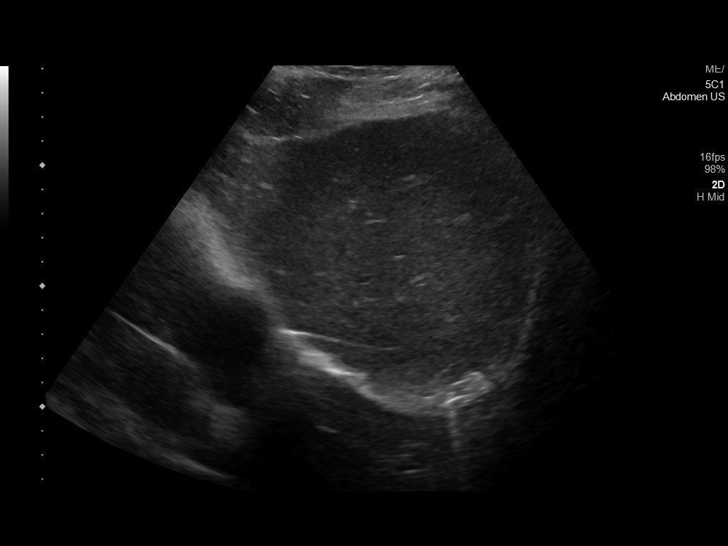
[im 33/87]
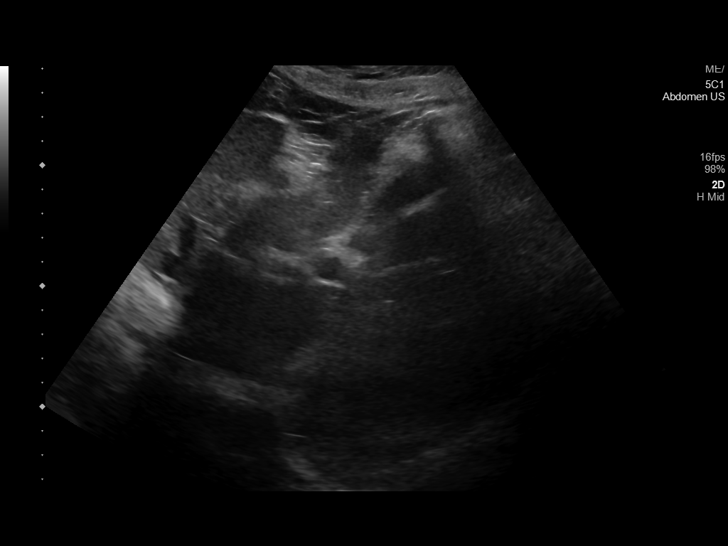
[im 40/87]
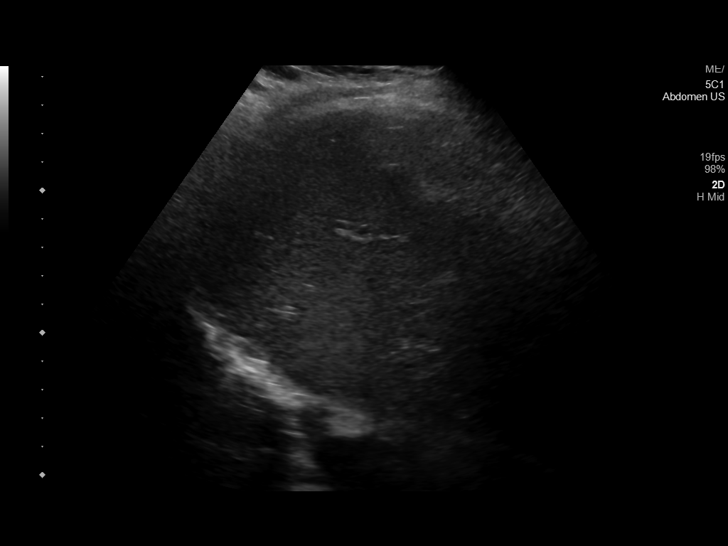
[im 47/87]
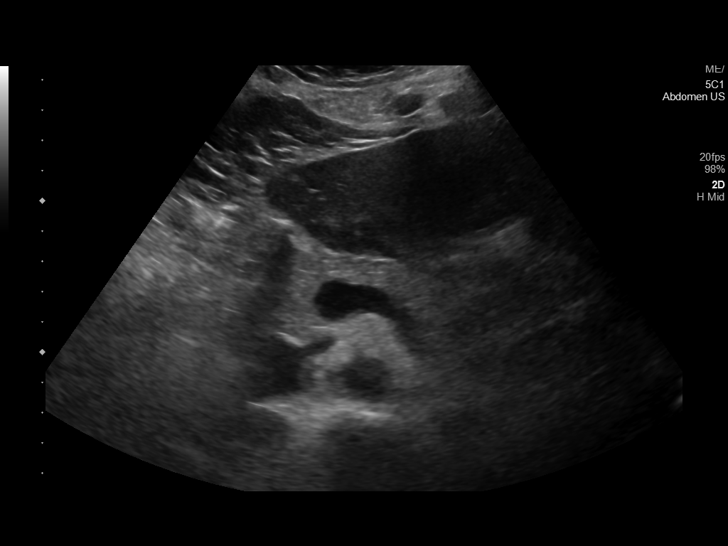
[im 54/87]
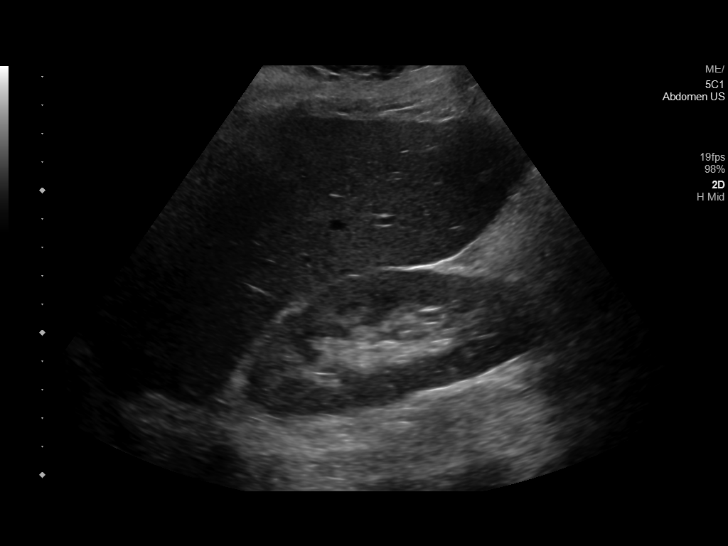
[im 58/87]
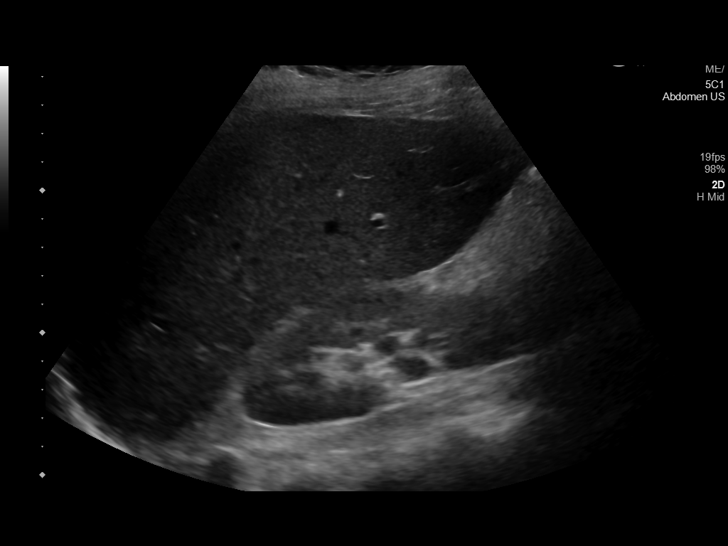
[im 65/87]
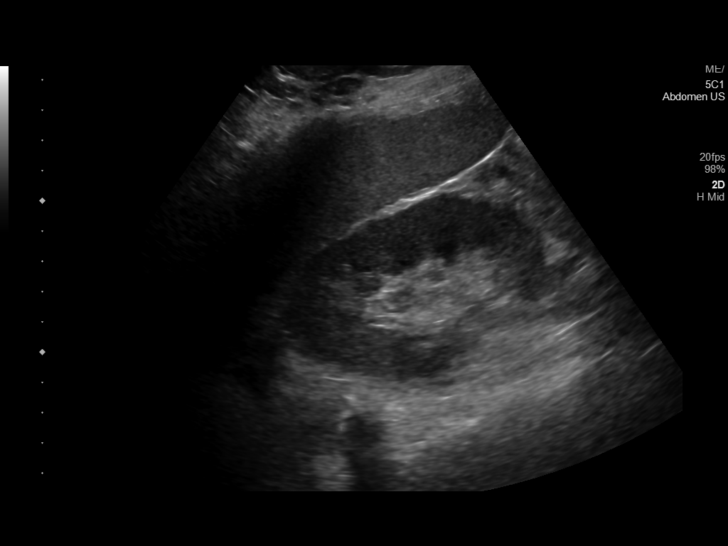
[im 72/87]
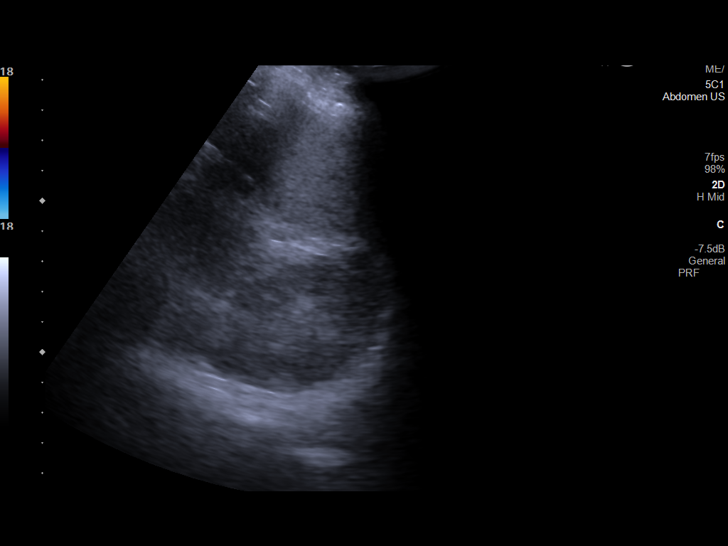
[im 79/87]
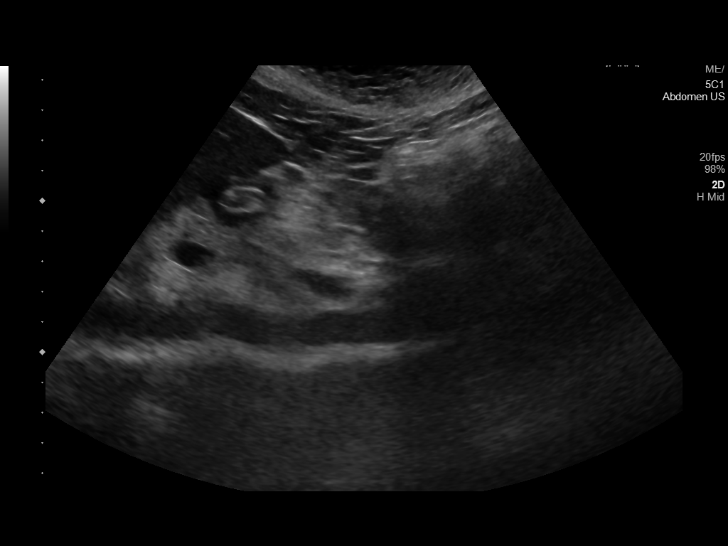
[im 87/87]
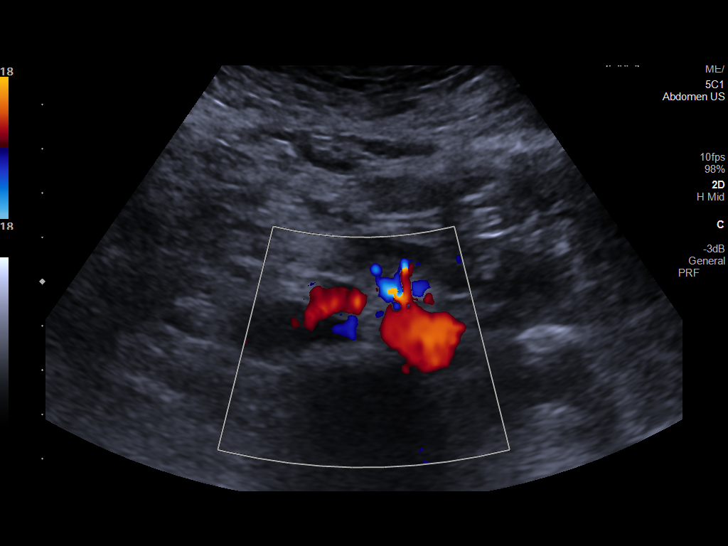

[14 of 25 positions shown; findings below may reference images not displayed]

FINDINGS: Gallbladder: Mobile 9 mm gallstone within the gallbladder. No wall
thickening or sonographic Murphy sign.

Common bile duct: Diameter: Normal caliber, 3 mm.

Liver: No focal lesion identified. Within normal limits in
parenchymal echogenicity. Portal vein is patent on color Doppler
imaging with normal direction of blood flow towards the liver.

IVC: No abnormality visualized.

Pancreas: Visualized portion unremarkable.

Spleen: Size and appearance within normal limits.

Right Kidney: Length: 11.1 cm. Echogenicity within normal limits. No
mass or hydronephrosis visualized.

Left Kidney: Length: 11.2 cm. Echogenicity within normal limits. No
mass or hydronephrosis visualized.

Abdominal aorta: No aneurysm visualized.

Other findings: None.
IMPRESSION: Cholelithiasis.  No sonographic evidence of acute cholecystitis.

## 2022-01-14 ENCOUNTER — Encounter: Payer: Medicaid Other | Admitting: Certified Nurse Midwife

## 2022-01-16 ENCOUNTER — Ambulatory Visit: Payer: Medicaid Other

## 2022-01-22 DIAGNOSIS — R0981 Nasal congestion: Secondary | ICD-10-CM | POA: Diagnosis not present

## 2022-01-28 DIAGNOSIS — J301 Allergic rhinitis due to pollen: Secondary | ICD-10-CM | POA: Diagnosis not present

## 2022-02-05 DIAGNOSIS — J301 Allergic rhinitis due to pollen: Secondary | ICD-10-CM | POA: Diagnosis not present

## 2022-02-06 ENCOUNTER — Encounter: Payer: Self-pay | Admitting: Advanced Practice Midwife

## 2022-02-06 ENCOUNTER — Ambulatory Visit: Payer: Medicaid Other | Admitting: Advanced Practice Midwife

## 2022-02-06 DIAGNOSIS — F1510 Other stimulant abuse, uncomplicated: Secondary | ICD-10-CM | POA: Insufficient documentation

## 2022-02-06 DIAGNOSIS — Z113 Encounter for screening for infections with a predominantly sexual mode of transmission: Secondary | ICD-10-CM | POA: Diagnosis not present

## 2022-02-06 DIAGNOSIS — B9689 Other specified bacterial agents as the cause of diseases classified elsewhere: Secondary | ICD-10-CM

## 2022-02-06 HISTORY — DX: Other stimulant abuse, uncomplicated: F15.10

## 2022-02-06 LAB — WET PREP FOR TRICH, YEAST, CLUE
Trichomonas Exam: NEGATIVE
Yeast Exam: NEGATIVE

## 2022-02-06 LAB — HM HIV SCREENING LAB: HM HIV Screening: NEGATIVE

## 2022-02-06 MED ORDER — METRONIDAZOLE 500 MG PO TABS: 500.0000 mg | ORAL_TABLET | Freq: Two times a day (BID) | ORAL | 0 refills | Status: AC

## 2022-02-06 NOTE — Progress Notes (Signed)
[facility] Department  STI clinic/screening visit [place] [address] [phone]  Subjective:  Natasha Sloan is a 31 y.o. SHF exsmoker G3P2  female being seen today for an STI screening visit. The patient reports they do have symptoms.  Patient reports that they do not desire a pregnancy in the next year.   They reported they are not interested in discussing contraception today.    Patient's last menstrual period was 01/14/2022.   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   Sepsis due to urinary tract infection ([place]) 05/30/2021   Severe sepsis with acute organ dysfunction ([place]) 65/99/3570   Complicated UTI (urinary tract infection) 05/29/2021   Hypokalemia 05/29/2021   Hypomagnesemia 05/29/2021   Elevated LFTs 05/29/2021   History of postpartum hemorrhage 02/27/2021   Hx of maternal laceration, sulcus tear, currently pregnant, third trimester 02/27/2021   GBS bacteriuria 01/03/2021   History of chlamydia 09/14/2020   R/O Posttraumatic stress disorder 03/29/2018   Asthma 09/09/2011    Chief Complaint  Patient presents with   SEXUALLY TRANSMITTED DISEASE    Screening    HPI  Patient reports c/o dysuria and urinary frequency x 3 days. LMP 01/14/22. Has Nexplanon x 9 mo. Last sex 02/03/22 without condom; with current partner x 4 mo; 2 sex partners in last 3 mo. Not breastfeeding 61 mo old. Last cig 2013. Last vaped 2013. Last MJ 2014. Last ETOH 01/30/22 (2 beers) qo weekend. Drinks 4 12 oz cans coke/day  Last HIV test per patient/review of record was 09/27/20 Patient reports last pap was 03/18/18 neg  Screening for MPX risk: Does the patient have an unexplained rash? No Is the patient MSM? No Does the patient endorse multiple sex partners or anonymous sex partners? No Did the patient have close or sexual contact with a person diagnosed with MPX?  No Has the patient traveled outside the [place] where MPX is endemic? No Is there a high clinical suspicion for MPX-- evidenced by one of the following No  -Unlikely to be chickenpox  -Lymphadenopathy  -Rash that present in same phase of evolution on any given body part See flowsheet for further details and programmatic requirements.   Immunization history:  Immunization History  Administered Date(s) Administered   Influenza,inj,Quad PF,6+ Mos 05/30/2021   MMR 03/14/2021   Pneumococcal Polysaccharide-23 05/30/2021   Tdap 12/30/2020     The following portions of the patient's history were reviewed and updated as appropriate: allergies, current medications, past medical history, past social history, past surgical history and problem list.  Objective:  There were no vitals filed for this visit.  Physical Exam Vitals and nursing note reviewed.  Constitutional:      Appearance: Normal appearance. She is normal weight.  HENT:     Head: Normocephalic and atraumatic.     Mouth/Throat:     Mouth: Mucous membranes are moist.     Pharynx: Oropharynx is clear. No oropharyngeal exudate or posterior oropharyngeal erythema.     Comments: Pharyngeal swab obtained Eyes:     Conjunctiva/sclera: Conjunctivae normal.  Pulmonary:     Effort: Pulmonary effort is normal.  Abdominal:     General: Abdomen is flat.     Palpations: Abdomen is soft. There is no mass.     Tenderness: There is no abdominal tenderness. There is no rebound.     Comments: Soft without masses or tenderness, fair  tone  Genitourinary:    General: Normal vulva.     Exam position: Lithotomy position.     Pubic Area: No rash or pubic lice.      Labia:        Right: No rash or lesion.        Left: No rash or lesion.      Vagina: Bleeding (heavy red menses blood, ph>4.5) present. No vaginal discharge, erythema or lesions.     Cervix: Normal.     Uterus: Normal.      Adnexa: Right adnexa normal and left adnexa normal.      Rectum: Normal.     Comments: pH = >4.5 Lymphadenopathy:     Head:     Right side of head: No preauricular or posterior auricular adenopathy.     Left side of head: No preauricular or posterior auricular adenopathy.     Cervical: No cervical adenopathy.     Right cervical: No superficial, deep or posterior cervical adenopathy.    Left cervical: No superficial, deep or posterior cervical adenopathy.     Upper Body:     Right upper body: No supraclavicular, axillary or epitrochlear adenopathy.     Left upper body: No supraclavicular, axillary or epitrochlear adenopathy.     Lower Body: No right inguinal adenopathy. No left inguinal adenopathy.  Skin:    General: Skin is warm and dry.     Findings: No rash.  Neurological:     Mental Status: She is alert and oriented to person, place, and time.      Assessment and Plan:  Natasha Sloan is a 31 y.o. female presenting to the [facility] Department for STI screening  1. Screening examination for venereal disease Treat wet mount per standing orders Immunization nurse consult Referred to primary care MD for ? UTI sxs Counseled to d/c coke  - WET PREP FOR TRICH, YEAST, CLUE - Syphilis Serology, [place] [facility] - HIV [place] [facility] - Chlamydia/Gonorrhea [place] [facility] - Gonococcus culture - Gonococcus culture     No follow-ups on file.  Future Appointments  Date Time Provider [place]  02/26/2022 11:00 AM Philip Aspen, CNM [place] None    Herbie Saxon, CNM

## 2022-02-06 NOTE — Progress Notes (Signed)
Pt here for STI screening.  Wet mount reviewed.  Pt positive for BV.  Metronidazole 500mg  #14 1 tablet BID for 7 days.  Pt counseled to avoid alcohol x 10 days, take medication with food, and no sex x 10-14 days.  Pt verbalizes understanding of above and will call if any further questions or concerns.  Condoms declined.-Forest Becker, RN

## 2022-02-11 LAB — GONOCOCCUS CULTURE

## 2022-02-12 DIAGNOSIS — J301 Allergic rhinitis due to pollen: Secondary | ICD-10-CM | POA: Diagnosis not present

## 2022-02-16 DIAGNOSIS — J301 Allergic rhinitis due to pollen: Secondary | ICD-10-CM | POA: Diagnosis not present

## 2022-02-19 DIAGNOSIS — J301 Allergic rhinitis due to pollen: Secondary | ICD-10-CM | POA: Diagnosis not present

## 2022-02-23 ENCOUNTER — Encounter: Payer: Medicaid Other | Admitting: Certified Nurse Midwife

## 2022-02-23 ENCOUNTER — Telehealth: Payer: Self-pay

## 2022-02-23 DIAGNOSIS — J301 Allergic rhinitis due to pollen: Secondary | ICD-10-CM | POA: Diagnosis not present

## 2022-02-23 NOTE — Telephone Encounter (Addendum)
Calling pt regarding positive chlamydia result from 02/06/22 vagina specimen. Pt needs tx appt.   Phone call to pt, and pt provided password. Counseled regarding positive CT result. Pt requested Wed appt. States: NKA. Has had Nexplanon for about 1-1/2 years for [facility].  Pt scheduled for 02/25/22 tx appt.

## 2022-02-25 ENCOUNTER — Ambulatory Visit: Payer: Medicaid Other

## 2022-02-25 DIAGNOSIS — J301 Allergic rhinitis due to pollen: Secondary | ICD-10-CM | POA: Diagnosis not present

## 2022-02-25 DIAGNOSIS — A749 Chlamydial infection, unspecified: Secondary | ICD-10-CM

## 2022-02-25 MED ORDER — DOXYCYCLINE HYCLATE 100 MG PO TABS: 100.0000 mg | ORAL_TABLET | Freq: Two times a day (BID) | ORAL | 0 refills | Status: AC

## 2022-02-25 NOTE — Progress Notes (Signed)
In Nurse Clinic for chlamydia tx. Has implant. Treated today per SO Dr Karyl Kinnier with Doxycycline 100 mg #14 with instructions to take one capsule twice a day for 7 days. RN dispensed doxy 100 mg #14. Instructions explained. Questions answered and reports understanding.  Pt explained she did not complete the 7 day metronidazole treatment for BV. She only took 5 days worth. Consult A White, FNP who recommends pt to be rescreened  in 3 months when Chlamydia TOC is due.  RN explained provider recommendations. Also, RN recommended that if s/s do not resolve within next couple weeks, she can schedule provider visit. Jerel Shepherd, RN

## 2022-02-26 ENCOUNTER — Encounter: Payer: Medicaid Other | Admitting: Certified Nurse Midwife

## 2022-02-26 NOTE — Progress Notes (Signed)
Consulted by RN re: patient situation.  Reviewed RN note and agree that it reflects our discussion and my recommendations. Trelon Plush, FNP  

## 2022-02-26 NOTE — Telephone Encounter (Signed)
Pt kept tx appt 02/25/22.

## 2022-03-17 ENCOUNTER — Encounter: Payer: Medicaid Other | Admitting: Certified Nurse Midwife

## 2022-04-02 DIAGNOSIS — J301 Allergic rhinitis due to pollen: Secondary | ICD-10-CM | POA: Diagnosis not present

## 2022-04-09 DIAGNOSIS — J301 Allergic rhinitis due to pollen: Secondary | ICD-10-CM | POA: Diagnosis not present

## 2022-04-16 DIAGNOSIS — J301 Allergic rhinitis due to pollen: Secondary | ICD-10-CM | POA: Diagnosis not present

## 2022-05-14 ENCOUNTER — Encounter: Payer: Self-pay | Admitting: Advanced Practice Midwife

## 2022-05-14 ENCOUNTER — Ambulatory Visit: Payer: Medicaid Other | Admitting: Advanced Practice Midwife

## 2022-05-14 DIAGNOSIS — Z202 Contact with and (suspected) exposure to infections with a predominantly sexual mode of transmission: Secondary | ICD-10-CM

## 2022-05-14 DIAGNOSIS — Z113 Encounter for screening for infections with a predominantly sexual mode of transmission: Secondary | ICD-10-CM | POA: Diagnosis not present

## 2022-05-14 LAB — WET PREP FOR TRICH, YEAST, CLUE
Trichomonas Exam: NEGATIVE
Yeast Exam: NEGATIVE

## 2022-05-14 LAB — HM HIV SCREENING LAB: HM HIV Screening: NEGATIVE

## 2022-05-14 MED ORDER — DOXYCYCLINE HYCLATE 100 MG PO TABS: 100.0000 mg | ORAL_TABLET | Freq: Two times a day (BID) | ORAL | 0 refills | Status: AC

## 2022-05-14 NOTE — Progress Notes (Signed)
[facility] ([place] Department  STI clinic/screening visit [place] [address] [phone]  Subjective:  Natasha Sloan is a 31 y.o. SHF exsmoker G3P2 female being seen today for an STI screening visit. The patient reports they do have symptoms.  Patient reports that they do not desire a pregnancy in the next year.   They reported they are not interested in discussing contraception today.    Patient's last menstrual period was 05/04/2022 (exact date).   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   Sepsis due to urinary tract infection ([place]) 05/30/2021   Severe sepsis with acute organ dysfunction ([place]) 22/10/5425   Complicated UTI (urinary tract infection) 05/29/2021   Hypokalemia 05/29/2021   Hypomagnesemia 05/29/2021   Elevated LFTs 05/29/2021   History of postpartum hemorrhage 02/27/2021   GBS bacteriuria 01/03/2021   History of chlamydia 09/14/2020   R/O Posttraumatic stress disorder 03/29/2018   Asthma 09/09/2011    Chief Complaint  Patient presents with   SEXUALLY TRANSMITTED DISEASE    Screening- patient stated her partner tested positive for chlamydia patient complained of burning when urinating and white discharge     HPI  Patient reports partner dx'd with Chlamydia 2 wks ago and she is not sure if he was treated or not. She also c/o white d/c with dysuria x 2 days. LMP 05/04/22. Last sex 05/08/22 with condom; with current partner x 6 mo; 2 sex partners in last 3 mo. Last cig 10 years ago. Last ETOH 05/08/22 (1 wine cooler) 2x/mo. Has Nexplanon. Pt treated for + Chlamydia on 02/25/22.  Last HIV test per patient/review of record was 02/06/22 Patient reports last pap was 03/18/18 neg  Screening for MPX risk: Does the patient have an unexplained rash? No Is the patient MSM? No Does the patient endorse multiple sex  partners or anonymous sex partners? Yes Did the patient have close or sexual contact with a person diagnosed with MPX? No Has the patient traveled outside the [place] where MPX is endemic? No Is there a high clinical suspicion for MPX-- evidenced by one of the following No  -Unlikely to be chickenpox  -Lymphadenopathy  -Rash that present in same phase of evolution on any given body part See flowsheet for further details and programmatic requirements.   Immunization history:  Immunization History  Administered Date(s) Administered   Influenza,inj,Quad PF,6+ Mos 05/30/2021   MMR 03/14/2021   Pneumococcal Polysaccharide-23 05/30/2021   Tdap 12/30/2020     The following portions of the patient's history were reviewed and updated as appropriate: allergies, current medications, past medical history, past social history, past surgical history and problem list.  Objective:  There were no vitals filed for this visit.  Physical Exam Vitals and nursing note reviewed.  Constitutional:      Appearance: Normal appearance. She is normal weight.  HENT:     Head: Normocephalic and atraumatic.     Mouth/Throat:     Mouth: Mucous membranes are moist.     Pharynx: Oropharynx is clear. No oropharyngeal exudate or posterior oropharyngeal erythema.  Pulmonary:     Effort: Pulmonary effort is normal.  Abdominal:     General: Abdomen is flat.     Palpations: Abdomen is soft. There is no mass.     Tenderness: There is no abdominal tenderness. There is no rebound.     Comments: Soft  without masses or tenderness, good tone  Genitourinary:    General: Normal vulva.     Exam position: Lithotomy position.     Pubic Area: No rash or pubic lice.      Labia:        Right: No rash or lesion.        Left: No rash or lesion.      Vagina: Vaginal discharge (white creamy leukorrhea, ph<4.5) present. No erythema, bleeding or lesions.     Cervix: Normal.     Uterus: Normal.      Adnexa: Right adnexa normal and  left adnexa normal.     Rectum: Normal.     Comments: pH = <4.5 Lymphadenopathy:     Head:     Right side of head: No preauricular or posterior auricular adenopathy.     Left side of head: No preauricular or posterior auricular adenopathy.     Cervical: No cervical adenopathy.     Right cervical: No superficial, deep or posterior cervical adenopathy.    Left cervical: No superficial, deep or posterior cervical adenopathy.     Upper Body:     Right upper body: No supraclavicular, axillary or epitrochlear adenopathy.     Left upper body: No supraclavicular, axillary or epitrochlear adenopathy.     Lower Body: No right inguinal adenopathy. No left inguinal adenopathy.  Skin:    General: Skin is warm and dry.     Findings: No rash.  Neurological:     Mental Status: She is alert and oriented to person, place, and time.     Assessment and Plan:  Natasha Sloan is a 31 y.o. female presenting to the [facility] Department for STI screening  1. Screening examination for venereal disease Treat as contact to Chlamydia per standing orders please Treat wet mount per standing orders Immunization nurse consult  - Gonococcus culture - Chlamydia/Gonorrhea [place] [facility] - HIV [place] [facility] - Syphilis Serology,  Lab - Gonococcus culture - WET PREP FOR [place], YEAST, CLUE     No follow-ups on file.  No future appointments.  Herbie Saxon, CNM

## 2022-05-14 NOTE — Progress Notes (Deleted)
[facility] Department  STI clinic/screening visit [place] [address] [phone]  Subjective:  Natasha Sloan is a 31 y.o. female being seen today for an STI screening visit. The patient reports they {Actions; do/do not:19616} have symptoms.  Patient reports that they {Actions; do/do not:19616} desire a pregnancy in the next year.   They reported they {Actions; are/are not:16769} interested in discussing contraception today.    Patient's last menstrual period was 05/04/2022 (exact date).   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   Sepsis due to urinary tract infection ([place]) 05/30/2021   Severe sepsis with acute organ dysfunction ([place]) 32/44/0102   Complicated UTI (urinary tract infection) 05/29/2021   Hypokalemia 05/29/2021   Hypomagnesemia 05/29/2021   Elevated LFTs 05/29/2021   History of postpartum hemorrhage 02/27/2021   GBS bacteriuria 01/03/2021   History of chlamydia 09/14/2020   R/O Posttraumatic stress disorder 03/29/2018   Asthma 09/09/2011    Chief Complaint  Patient presents with   SEXUALLY TRANSMITTED DISEASE    Screening- patient stated her partner tested positive for chlamydia patient complained of burning when urinating and white discharge     HPI  Patient reports ***  Last HIV test per patient/review of record was *** Patient reports last pap was ***.   Screening for MPX risk: Does the patient have an unexplained rash? {yes/no:20286} Is the patient MSM? {yes/no:20286} Does the patient endorse multiple sex partners or anonymous sex partners? {yes/no:20286} Did the patient have close or sexual contact with a person diagnosed with MPX? {yes/no:20286} Has the patient traveled outside the [place] where MPX is endemic? {yes/no:20286} Is there a high clinical suspicion for MPX-- evidenced by one  of the following {yes/no:20286}  -Unlikely to be chickenpox  -Lymphadenopathy  -Rash that present in same phase of evolution on any given body part See flowsheet for further details and programmatic requirements.   Immunization history:  Immunization History  Administered Date(s) Administered   Influenza,inj,Quad PF,6+ Mos 05/30/2021   MMR 03/14/2021   Pneumococcal Polysaccharide-23 05/30/2021   Tdap 12/30/2020     The following portions of the patient's history were reviewed and updated as appropriate: allergies, current medications, past medical history, past social history, past surgical history and problem list.  Objective:  There were no vitals filed for this visit.     Assessment and Plan:  Natasha Sloan is a 31 y.o. female presenting to the [facility] Department for STI screening  1. Screening examination for venereal disease Treat wet mount per standing orders Treat as contact to Chlamydia per standing orders please Immunization nurse consult  - Gonococcus culture - Chlamydia/Gonorrhea [place] [facility] - HIV [place] [facility] - Syphilis Serology, [place] [facility] - Gonococcus culture - WET PREP FOR [place], YEAST, CLUE     No follow-ups on file.  No future appointments.  Herbie Saxon, CNM

## 2022-05-14 NOTE — Progress Notes (Signed)
Pt here for STD screening.  Wet mount results reviewed.  The patient was dispensed Doxycycline 100 mg #14 today. I provided counseling today regarding the medication. We discussed the medication, the side effects and when to call clinic. Patient given the opportunity to ask questions. Questions answered. Condoms declined.  Berdie Ogren, RN

## 2022-05-19 LAB — GONOCOCCUS CULTURE

## 2022-06-10 ENCOUNTER — Encounter: Payer: Self-pay | Admitting: Certified Nurse Midwife

## 2022-07-22 DIAGNOSIS — J45909 Unspecified asthma, uncomplicated: Secondary | ICD-10-CM | POA: Diagnosis not present

## 2022-07-22 DIAGNOSIS — Z87440 Personal history of urinary (tract) infections: Secondary | ICD-10-CM | POA: Diagnosis not present

## 2022-07-22 DIAGNOSIS — Z809 Family history of malignant neoplasm, unspecified: Secondary | ICD-10-CM | POA: Diagnosis not present

## 2022-07-22 DIAGNOSIS — Z833 Family history of diabetes mellitus: Secondary | ICD-10-CM | POA: Diagnosis not present

## 2022-08-13 ENCOUNTER — Emergency Department: Payer: Medicaid Other

## 2022-08-13 ENCOUNTER — Emergency Department
Admission: EM | Admit: 2022-08-13 | Discharge: 2022-08-13 | Disposition: A | Discharge status: Home / Self Care | Payer: Medicaid Other | Attending: Student in an Organized Health Care Education/Training Program | Admitting: Student in an Organized Health Care Education/Training Program

## 2022-08-13 ENCOUNTER — Ambulatory Visit: Payer: Self-pay | Admitting: *Deleted

## 2022-08-13 ENCOUNTER — Other Ambulatory Visit: Payer: Self-pay

## 2022-08-13 DIAGNOSIS — R300 Dysuria: Secondary | ICD-10-CM | POA: Insufficient documentation

## 2022-08-13 DIAGNOSIS — M545 Low back pain, unspecified: Secondary | ICD-10-CM | POA: Diagnosis not present

## 2022-08-13 DIAGNOSIS — R1031 Right lower quadrant pain: Secondary | ICD-10-CM | POA: Insufficient documentation

## 2022-08-13 LAB — BASIC METABOLIC PANEL
Anion gap: 5 (ref 5–15)
BUN: 13 mg/dL (ref 6–20)
CO2: 23 mmol/L (ref 22–32)
Calcium: 8.9 mg/dL (ref 8.9–10.3)
Chloride: 107 mmol/L (ref 98–111)
Creatinine, Ser: 0.54 mg/dL (ref 0.44–1.00)
GFR, Estimated: >60 mL/min (ref >60)
Glucose, Bld: 99 mg/dL (ref 70–99)
Potassium: 4 mmol/L (ref 3.5–5.1)
Sodium: 135 mmol/L (ref 135–145)

## 2022-08-13 LAB — URINALYSIS, ROUTINE W REFLEX MICROSCOPIC
Bilirubin Urine: NEGATIVE
Glucose, UA: NEGATIVE mg/dL
Ketones, ur: NEGATIVE mg/dL
Leukocytes,Ua: NEGATIVE
Nitrite: NEGATIVE
Protein, ur: NEGATIVE mg/dL
Specific Gravity, Urine: 1.028 (ref 1.005–1.030)
pH: 5 (ref 5.0–8.0)

## 2022-08-13 LAB — HCG, QUANTITATIVE, PREGNANCY: hCG, Beta Chain, Quant, S: <1 m[IU]/mL (ref <5)

## 2022-08-13 LAB — CBC
HCT: 38.4 % (ref 36.0–46.0)
Hemoglobin: 12.5 g/dL (ref 12.0–15.0)
MCH: 26.3 pg (ref 26.0–34.0)
MCHC: 32.6 g/dL (ref 30.0–36.0)
MCV: 80.8 fL (ref 80.0–100.0)
Platelets: 262 10*3/uL (ref 150–400)
RBC: 4.75 MIL/uL (ref 3.87–5.11)
RDW: 15 % (ref 11.5–15.5)
WBC: 5.7 10*3/uL (ref 4.0–10.5)
nRBC: 0 % (ref 0.0–0.2)

## 2022-08-13 MED ORDER — SODIUM CHLORIDE 0.9 % IV BOLUS
500.0000 mL | Freq: Once | INTRAVENOUS | Status: AC
  Administered 2022-08-13: 500 mL via INTRAVENOUS

## 2022-08-13 MED ORDER — OXYCODONE-ACETAMINOPHEN 5-325 MG PO TABS: 1.0000 | ORAL_TABLET | ORAL | 0 refills | Status: DC | PRN

## 2022-08-13 MED ORDER — CEFDINIR 300 MG PO CAPS: 300.0000 mg | ORAL_CAPSULE | Freq: Two times a day (BID) | ORAL | 0 refills | Status: AC

## 2022-08-13 MED ORDER — IOHEXOL 300 MG/ML  SOLN
100.0000 mL | Freq: Once | INTRAMUSCULAR | Status: AC | PRN
  Administered 2022-08-13: 100 mL via INTRAVENOUS

## 2022-08-13 MED ORDER — CEFDINIR 300 MG PO CAPS
300.0000 mg | ORAL_CAPSULE | Freq: Two times a day (BID) | ORAL | Status: DC
  Administered 2022-08-13: 300 mg via ORAL
  Filled 2022-08-13 (×2): qty 1

## 2022-08-13 MED ORDER — OXYCODONE-ACETAMINOPHEN 5-325 MG PO TABS
1.0000 | ORAL_TABLET | Freq: Once | ORAL | Status: AC
  Administered 2022-08-13: 1 via ORAL
  Filled 2022-08-13: qty 1

## 2022-08-13 NOTE — ED Provider Notes (Signed)
[facility] Provider Note    Event Date/Time   First MD Initiated Contact with Patient 08/13/22 1214     (approximate)   History   Back Pain   HPI  Ronnie Anstine is a 31 y.o. female sent to ER for evaluation of low back pain and significant urgency and dysuria over the past 24 to 48 hours.  Does have history of kidney infection has required admission to hospital for sepsis secondary to E. coli UTI.  Denies any history of kidney stone.  She is having some right-sided pain.  Denies any vaginal discharge or vaginal pain.  No diarrhea.     Physical Exam   Triage Vital Signs: ED Triage Vitals  Enc Vitals Group     BP 08/13/22 1207 113/65     Pulse Rate 08/13/22 1207 (!) 111     Resp 08/13/22 1207 18     Temp 08/13/22 1207 98.3 F (36.8 C)     Temp Source 08/13/22 1207 Oral     SpO2 08/13/22 1207 98 %     Weight 08/13/22 1208 135 lb (61.2 kg)     Height 08/13/22 1208 5\' 1"  (1.549 m)     Head Circumference --      Peak Flow --      Pain Score 08/13/22 1208 9     Pain Loc --      Pain Edu? --      Excl. in GC? --     Most recent vital signs: Vitals:   08/13/22 1207 08/13/22 1538  BP: 113/65 115/70  Pulse: (!) 111 94  Resp: 18 16  Temp: 98.3 F (36.8 C) 98.6 F (37 C)  SpO2: 98% 97%     Constitutional: Alert  Eyes: Conjunctivae are normal.  Head: Atraumatic. Nose: No congestion/rhinnorhea. Mouth/Throat: Mucous membranes are moist.   Neck: Painless ROM.  Cardiovascular:   Good peripheral circulation. Respiratory: Normal respiratory effort.  No retractions.  Gastrointestinal: Soft with mild ttp in rlq, no guarding or rebound  Musculoskeletal:  no deformity Neurologic:  MAE spontaneously. No gross focal neurologic deficits are appreciated.  Skin:  Skin is warm, dry and intact. No rash noted. Psychiatric: Mood and affect are normal. Speech and behavior are normal.    ED Results / Procedures / Treatments   Labs (all labs ordered are  listed, but only abnormal results are displayed) Labs Reviewed  URINALYSIS, ROUTINE W REFLEX MICROSCOPIC - Abnormal; Notable for the following components:      Result Value   Color, Urine YELLOW (*)    APPearance HAZY (*)    Hgb urine dipstick SMALL (*)    Bacteria, UA RARE (*)    All other components within normal limits  URINE CULTURE  CBC  BASIC METABOLIC PANEL  HCG, QUANTITATIVE, PREGNANCY  POC URINE PREG, ED     EKG     RADIOLOGY Please see ED Course for my review and interpretation.  I personally reviewed all radiographic images ordered to evaluate for the above acute complaints and reviewed radiology reports and findings.  These findings were personally discussed with the patient.  Please see medical record for radiology report.    PROCEDURES:  Critical Care performed: No  Procedures   MEDICATIONS ORDERED IN ED: Medications  cefdinir (OMNICEF) capsule 300 mg (300 mg Oral Given 08/13/22 1541)  oxyCODONE-acetaminophen (PERCOCET/ROXICET) 5-325 MG per tablet 1 tablet (1 tablet Oral Given 08/13/22 1313)  sodium chloride 0.9 % bolus 500 mL (500 mLs  Intravenous New Bag/Given 08/13/22 1335)  iohexol (OMNIPAQUE) 300 MG/ML solution 100 mL (100 mLs Intravenous Contrast Given 08/13/22 1430)     IMPRESSION / MDM / ASSESSMENT AND PLAN / ED COURSE  I reviewed the triage vital signs and the nursing notes.                              Differential diagnosis includes, but is not limited to, cystitis, pyelonephritis, stone, appendicitis, colitis, musculoskeletal strain, spasm, mass, cyst, torsion, TOA, PID  Patient presenting to the ER for evaluation of symptoms as described above.  Based on symptoms, risk factors and considered above differential, this presenting complaint could reflect a potentially life-threatening illness therefore the patient will be placed on continuous pulse oximetry and telemetry for monitoring.  Laboratory evaluation will be sent to evaluate for the  above complaints.      Clinical Course as of 08/13/22 1549  Thu Aug 13, 2022  1427 Patient does appear quite uncomfortable no significant leukocytosis no fever urine not clearly infected.  Will order CT imaging to further evaluate. [PR]  1512 CT imaging my review and interpretation does not show any evidence of appendicitis or obstruction.  No acute finding per radiology.  We discussed findings on her CT imaging.  She remains well-appearing.  Her symptoms seem consistent with a UTI given her history of E. coli polynephritis will cover for likely cystitis and pyonephritis.  She tolerating p.o.  Does appear stable and appropriate for outpatient follow-up. [PR]    Clinical Course User Index [PR] Willy Eddy, MD    FINAL CLINICAL IMPRESSION(S) / ED DIAGNOSES   Final diagnoses:  Dysuria     Rx / DC Orders   ED Discharge Orders          Ordered    oxyCODONE-acetaminophen (PERCOCET) 5-325 MG tablet  Every 4 hours PRN        08/13/22 1509    cefdinir (OMNICEF) 300 MG capsule  2 times daily        08/13/22 1509             Note:  This document was prepared using Dragon voice recognition software and may include unintentional dictation errors.    Willy Eddy, MD 08/13/22 [phone]

## 2022-08-13 NOTE — Telephone Encounter (Signed)
Noted pt going to ed

## 2022-08-13 NOTE — ED Triage Notes (Signed)
Pt c/o lower back pain with chills and urinary retention x2 days

## 2022-08-13 NOTE — Discharge Instructions (Signed)
IMPRESSION: Small fat containing periumbilical hernia.   31 x 18 mm oval-shaped fluid collection is seen in the soft tissues to the left of the vagina and anal region which was present on prior exams, including 2013, but does appear to be slightly enlarged. This most likely represents benign cystic abnormality.   No other abnormality seen in the abdomen or pelvis.

## 2022-08-13 NOTE — ED Notes (Signed)
Pt able to void, UA obtained and sent to lab.

## 2022-08-13 NOTE — ED Notes (Signed)
Bladder scan showed 0ml.  

## 2022-08-13 NOTE — Telephone Encounter (Signed)
Reason for Disposition  Patient sounds very sick or weak to the triager  Answer Assessment - Initial Assessment Questions 1. ONSET: "When did the pain begin?"      Severe lower back.   I feel like I'm in labor.   It's right at my tailbone.   I've had this pain in the past.   No kidney stone history.   I'm not urinating as much.    I've had my period for 20 days now.   I need to get it checked out.   No burning with urination.   I've never had this pain checked out.    I referred her to the ED due to severe pain.    She refused.     It's shooting down my right leg.    I'm driving right now.   The pain is coming and going.    2. LOCATION: "Where does it hurt?" (upper, mid or lower back)     Severe lower back pain.   Going on for a couple of months.    Today it's stronger than what is usual for me.    Today I had a lot of things to get done.    At work I was laying down on a table because I could not move. I usually take the pain and lay down.   But today it's unbearable pain.    I again referred her to the ED.    I'm having tingling all over my body.      [facility] is 4 exits away so I'll probably go there. 3. SEVERITY: "How bad is the pain?"  (e.g., Scale 1-10; mild, moderate, or severe)   - MILD (1-3): Doesn't interfere with normal activities.    - MODERATE (4-7): Interferes with normal activities or awakens from sleep.    - SEVERE (8-10): Excruciating pain, unable to do any normal activities.      Severe    She is in tears.     4. PATTERN: "Is the pain constant?" (e.g., yes, no; constant, intermittent)      Yes and severe in my lower back 5. RADIATION: "Does the pain shoot into your legs or somewhere else?"     Yes      I'm also tingling all over my body 6. CAUSE:  "What do you think is causing the back pain?"      I don't know    I've never had this checked out. 7. BACK OVERUSE:  "Any recent lifting of heavy objects, strenuous work or exercise?"     No 8. MEDICINES:  "What have you taken so far for the pain?" (e.g., nothing, acetaminophen, NSAIDS)     Not asked    9. NEUROLOGIC SYMPTOMS: "Do you have any weakness, numbness, or problems with bowel/bladder control?"     Denies any urinary symptoms except not urinating as much as usual. 10. OTHER SYMPTOMS: "Do you have any other symptoms?" (e.g., fever, abdomen pain, burning with urination, blood in urine)       No burning with urination or blood in urine. 11. PREGNANCY: "Is there any chance you are pregnant?" "When was your last menstrual period?"       Not asked  Protocols used: Back Pain-A-AH  Chief Complaint: C/o severe lower back pain.   (Driving while I was talking with her) Symptoms: Severe lower back pain with pain down one of her legs.   Crying while talking to me. Frequency: Constant but  has had this pain for a couple of months but has not had it checked out.   Today it's really bad for some reason. Pertinent Negatives: Patient denies injuries/accidents Disposition: [x] ED /[] Urgent Care (no appt availability in office) / [] Appointment(In office/virtual)/ []  [facility]/ [] Home Care/ [] Refused Recommended Disposition /[]  Mobile Bus/ []  Follow-up with PCP Additional Notes: Pt. Agreeable to going to [facility].    "I'm 4 exits away from the [facility] now"

## 2022-08-14 ENCOUNTER — Telehealth: Payer: Self-pay

## 2022-08-14 LAB — URINE CULTURE: Culture: 5000 — AB

## 2022-08-14 NOTE — Patient Outreach (Signed)
Transition Care Management Follow-up Telephone Call Date of discharge and from where: 08/13/22 [facility] How have you been since you were released from the hospital? Still in some pain, medication is helping Any questions or concerns? Yes  Items Reviewed: Did the pt receive and understand the discharge instructions provided? Yes  Medications obtained and verified? Yes  Other? No  Any new allergies since your discharge? No  Dietary orders reviewed? Yes Do you have support at home? Yes   Home Care and Equipment/Supplies: Were home health services ordered? not applicable If so, what is the name of the agency?   Has the agency set up a time to come to the patient's home? not applicable Were any new equipment or medical supplies ordered?  No What is the name of the medical supply agency?  Were you able to get the supplies/equipment? not applicable Do you have any questions related to the use of the equipment or supplies? No  Functional Questionnaire: (I = Independent and D = Dependent) ADLs: I  Bathing/Dressing- I  Meal Prep- I  Eating- I  Maintaining continence- I  Transferring/Ambulation- I  Managing Meds- I  Follow up appointments reviewed:  PCP Hospital f/u appt confirmed? No  Scheduled to see  on  @ . Specialist Hospital f/u appt confirmed? No  Scheduled to see  on  @ . Are transportation arrangements needed? No  If their condition worsens, is the pt aware to call PCP or go to the Emergency Dept.? Yes Was the patient provided with contact information for the PCP's office or ED? Yes Was to pt encouraged to call back with questions or concerns? Yes Gus Puma, BSW, [place] [facility]  [facility]  High Risk Managed Medicaid Team  [phone]

## 2022-08-28 DIAGNOSIS — J111 Influenza due to unidentified influenza virus with other respiratory manifestations: Secondary | ICD-10-CM | POA: Diagnosis not present

## 2022-08-28 DIAGNOSIS — Z20822 Contact with and (suspected) exposure to covid-19: Secondary | ICD-10-CM | POA: Diagnosis not present

## 2022-09-24 ENCOUNTER — Ambulatory Visit (INDEPENDENT_AMBULATORY_CARE_PROVIDER_SITE_OTHER): Payer: Medicaid Other | Admitting: Obstetrics and Gynecology

## 2022-09-24 ENCOUNTER — Encounter: Payer: Self-pay | Admitting: Obstetrics and Gynecology

## 2022-09-24 ENCOUNTER — Other Ambulatory Visit (HOSPITAL_COMMUNITY)
Admission: RE | Admit: 2022-09-24 | Discharge: 2022-09-24 | Disposition: A | Discharge status: Home / Self Care | Payer: Medicaid Other | Source: Ambulatory Visit | Attending: Obstetrics and Gynecology | Admitting: Obstetrics and Gynecology

## 2022-09-24 VITALS — BP 110/70 | Ht 61.0 in | Wt 142.0 lb

## 2022-09-24 DIAGNOSIS — Z975 Presence of (intrauterine) contraceptive device: Secondary | ICD-10-CM | POA: Diagnosis not present

## 2022-09-24 DIAGNOSIS — N760 Acute vaginitis: Secondary | ICD-10-CM

## 2022-09-24 DIAGNOSIS — B9689 Other specified bacterial agents as the cause of diseases classified elsewhere: Secondary | ICD-10-CM

## 2022-09-24 DIAGNOSIS — R399 Unspecified symptoms and signs involving the genitourinary system: Secondary | ICD-10-CM | POA: Diagnosis not present

## 2022-09-24 DIAGNOSIS — Z113 Encounter for screening for infections with a predominantly sexual mode of transmission: Secondary | ICD-10-CM

## 2022-09-24 DIAGNOSIS — N921 Excessive and frequent menstruation with irregular cycle: Secondary | ICD-10-CM | POA: Diagnosis not present

## 2022-09-24 HISTORY — DX: Other specified bacterial agents as the cause of diseases classified elsewhere: B96.89

## 2022-09-24 LAB — POCT WET PREP WITH KOH
Clue Cells Wet Prep HPF POC: POSITIVE
KOH Prep POC: POSITIVE — AB
Trichomonas, UA: NEGATIVE
Yeast Wet Prep HPF POC: NEGATIVE

## 2022-09-24 LAB — POCT URINALYSIS DIPSTICK
Bilirubin, UA: NEGATIVE
Blood, UA: NEGATIVE
Glucose, UA: NEGATIVE
Ketones, UA: NEGATIVE
Leukocytes, UA: NEGATIVE
Nitrite, UA: NEGATIVE
Protein, UA: NEGATIVE
Spec Grav, UA: 1.015 (ref 1.010–1.025)
pH, UA: 6.5 (ref 5.0–8.0)

## 2022-09-24 MED ORDER — NORETHINDRONE 0.35 MG PO TABS: 1.0000 | ORAL_TABLET | Freq: Every day | ORAL | 0 refills | Status: DC

## 2022-09-24 MED ORDER — METRONIDAZOLE 500 MG PO TABS: ORAL_TABLET | ORAL | 0 refills | Status: DC

## 2022-09-24 NOTE — Progress Notes (Signed)
Montel Culver, MD   Chief Complaint  Patient presents with   Urinary Tract Infection    Frequency and burning urinating, possible blood in urine x 5 days    HPI:      Ms. Natasha Sloan is a 32 y.o. [id number] whose LMP was No LMP recorded (lmp unknown). Patient has had an implant., presents today for increased vag d/c with odor, no irritation, recently. Does have a new sexual partner, no STD testing done. Also has urinary frequency with good flow, mild dysuria, questionable hematuria (gets BTB with nexplanon) for 2 days, no pelvic pain/fevers.  She has nexplanon with menses monthly, lasting 17 days or full month. Nexplanon placed 10/22.    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   Sepsis due to urinary tract infection ([place]) 05/30/2021   Severe sepsis with acute organ dysfunction ([place]) 29/51/8841   Complicated UTI (urinary tract infection) 05/29/2021   Hypokalemia 05/29/2021   Hypomagnesemia 05/29/2021   Elevated LFTs 05/29/2021   History of postpartum hemorrhage 02/27/2021   GBS bacteriuria 01/03/2021   History of chlamydia 09/14/2020   R/O Posttraumatic stress disorder 03/29/2018   Asthma 09/09/2011    Past Surgical History:  Procedure Laterality Date   EYE SURGERY Left 2007   "remove clot"   NASAL TURBINATE REDUCTION Bilateral 12/26/2021   Procedure: TURBINATE REDUCTION/SUBMUCOSAL RESECTION;  Surgeon: Beverly Gust, MD;  Location: [facility];  Service: ENT;  Laterality: Bilateral;   ROOT CANAL     x1   SEPTOPLASTY N/A 12/26/2021   Procedure: SEPTOPLASTY;  Surgeon: Beverly Gust, MD;  Location: [facility];  Service: ENT;  Laterality: N/A;   WISDOM TOOTH EXTRACTION Bilateral 02/2021    Family History  Problem Relation Age of Onset   Breast cancer Mother 61   Diabetes Mother    Diabetes Father     Social History    Socioeconomic History   Marital status: Single    Spouse name: Not on file   Number of children: 2   Years of education: 12+   Highest education level: Some college, no degree  Occupational History   Not on file  Tobacco Use   Smoking status: Former    Types: Cigarettes, E-cigarettes   Smokeless tobacco: Never  Vaping Use   Vaping Use: Never used  Substance and Sexual Activity   Alcohol use: Yes    Alcohol/week: 2.0 standard drinks of alcohol    Types: 2 Standard drinks or equivalent per week    Comment: last use 05/08/22 2x/mo   Drug use: Not Currently    Types: Cocaine, Marijuana    Comment: last use 2014   Sexual activity: Yes    Partners: Male    Birth control/protection: Implant, Condom  Other Topics Concern   Not on file  Social History Narrative   Not on file   Social Determinants of Health   Financial Resource Strain: Not on file  Food Insecurity: No Food Insecurity (08/14/2022)   Hunger Vital Sign    Worried About Running Out of Food in the Last Year: Never true    Ran Out of Food in the Last Year: Never true  Transportation Needs: No Transportation Needs (08/14/2022)   PRAPARE - Hydrologist (Medical): No    Lack of Transportation (Non-Medical): No  Physical Activity: Not on file  Stress: Not  on file  Social Connections: Not on file  Intimate Partner Violence: Not At Risk (08/28/2020)   Humiliation, Afraid, Rape, and Kick questionnaire    Fear of Current or Ex-Partner: No    Emotionally Abused: No    Physically Abused: No    Sexually Abused: No    Outpatient Medications Prior to Visit  Medication Sig Dispense Refill   albuterol (VENTOLIN HFA) 108 (90 Base) MCG/ACT inhaler Inhale 2 puffs into the lungs every 6 (six) hours as needed for wheezing or shortness of breath. 8 g 2   Etonogestrel (IMPLANON [place]) Inject into the skin.     mometasone-formoterol (DULERA) 100-5 MCG/ACT AERO Inhale 2 puffs into the lungs in the morning  and at bedtime. 1 each 2   HYDROcodone-Acetaminophen 5-300 MG TABS Take 1-2 tablets by mouth every 4 (four) hours as needed. (Patient not taking: Reported on 02/06/2022) 40 tablet 0   oxyCODONE-acetaminophen (PERCOCET) 5-325 MG tablet Take 1 tablet by mouth every 4 (four) hours as needed for severe pain. 10 tablet 0   Phenyleph-Triprolidine-DM-APAP (MUCINEX NIGHTSHIFT COLD/FLU PO) Take 1 tablet by mouth every 6 (six) hours. (Patient not taking: Reported on 12/17/2021)     promethazine-dextromethorphan (PROMETHAZINE-DM) 6.25-15 MG/5ML syrup Take 5 mLs by mouth 4 (four) times daily as needed for cough. (Patient not taking: Reported on 12/17/2021) 118 mL 0   sulfamethoxazole-trimethoprim (BACTRIM DS) 800-160 MG tablet Take 1 tablet by mouth 2 (two) times daily. (Patient not taking: Reported on 02/06/2022) 20 tablet 0   Vitamin D, Ergocalciferol, (DRISDOL) 1.25 MG (50000 UNIT) CAPS capsule Take 1 capsule (50,000 Units total) by mouth every 7 (seven) days. Take for 8 total doses(weeks) (Patient not taking: Reported on 12/17/2021) 8 capsule 0   No facility-administered medications prior to visit.      ROS:  Review of Systems  Constitutional:  Negative for fever.  Gastrointestinal:  Negative for blood in stool, constipation, diarrhea, nausea and vomiting.  Genitourinary:  Positive for dysuria, frequency, vaginal bleeding and vaginal discharge. Negative for dyspareunia, flank pain, hematuria, urgency and vaginal pain.  Musculoskeletal:  Negative for back pain.  Skin:  Negative for rash.   BREAST: No symptoms   OBJECTIVE:   Vitals:  BP 110/70   Ht 5\' 1"  (1.549 m)   Wt 142 lb (64.4 kg)   LMP  (LMP Unknown)   Breastfeeding No   BMI 26.83 kg/m   Physical Exam Vitals reviewed.  Constitutional:      Appearance: She is well-developed.  Pulmonary:     Effort: Pulmonary effort is normal.  Genitourinary:    General: Normal vulva.     Pubic Area: No rash.      Labia:        Right: No rash,  tenderness or lesion.        Left: No rash, tenderness or lesion.      Vagina: Vaginal discharge present. No erythema or tenderness.     Cervix: Normal.     Uterus: Normal. Not enlarged and not tender.      Adnexa: Right adnexa normal and left adnexa normal.       Right: No mass or tenderness.         Left: No mass or tenderness.    Musculoskeletal:        General: Normal range of motion.     Cervical back: Normal range of motion.  Skin:    General: Skin is warm and dry.  Neurological:     General: No  focal deficit present.     Mental Status: She is alert and oriented to person, place, and time.  Psychiatric:        Mood and Affect: Mood normal.        Behavior: Behavior normal.        Thought Content: Thought content normal.        Judgment: Judgment normal.     Results: Results for orders placed or performed in visit on 09/24/22 (from the past 24 hour(s))  POCT Wet Prep with KOH     Status: Abnormal   Collection Time: 09/24/22  5:39 PM  Result Value Ref Range   Trichomonas, UA Negative    Clue Cells Wet Prep HPF POC pos    Epithelial Wet Prep HPF POC     Yeast Wet Prep HPF POC neg    Bacteria Wet Prep HPF POC     RBC Wet Prep HPF POC     WBC Wet Prep HPF POC     KOH Prep POC Positive (A) Negative  POCT Urinalysis Dipstick     Status: Normal   Collection Time: 09/24/22  5:39 PM  Result Value Ref Range   Color, UA yellow    Clarity, UA clear    Glucose, UA Negative Negative   Bilirubin, UA neg    Ketones, UA neg    Spec Grav, UA 1.015 1.010 - 1.025   Blood, UA neg    pH, UA 6.5 5.0 - 8.0   Protein, UA Negative Negative   Urobilinogen, UA     Nitrite, UA neg    Leukocytes, UA Negative Negative   Appearance     Odor       Assessment/Plan: UTI symptoms - Plan: POCT Urinalysis Dipstick, Urine Culture; pos sx, neg UA. Check C&S. Will f/u if abn. If neg, could be due to BV.   BV - Plan: POCT Wet Prep with KOH, metroNIDAZOLE (FLAGYL) 500 MG tablet; pos sx and wet  prep. Rx flagyl, no EtOH. F/u prn.   Screening for STD (sexually transmitted disease) - Plan: Cervicovaginal ancillary only  Breakthrough bleeding on Nexplanon - Plan: norethindrone (MICRONOR) 0.35 MG tablet; Rx camila to stop BTB, rule out STDs. Reassurance. F/u prn.    Meds ordered this encounter  Medications   metroNIDAZOLE (FLAGYL) 500 MG tablet    Sig: Take 1 tab BID for 7 days; NO alcohol use for 10 days after prescription start    Dispense:  14 tablet    Refill:  0    Order Specific Question:   Supervising Provider    Answer:   Rubie Maid [[id number]]   norethindrone (MICRONOR) 0.35 MG tablet    Sig: Take 1 tablet (0.35 mg total) by mouth daily.    Dispense:  84 tablet    Refill:  0    Order Specific Question:   Supervising Provider    Answer:   Rubie Maid [[id number]]      Return if symptoms worsen or fail to improve.  Santresa Levett B. Arilyn Brierley, PA-C 09/24/2022 5:41 PM

## 2022-09-25 LAB — CERVICOVAGINAL ANCILLARY ONLY
Chlamydia: NEGATIVE
Comment: NEGATIVE
Comment: NEGATIVE
Comment: NORMAL
Neisseria Gonorrhea: NEGATIVE
Trichomonas: NEGATIVE

## 2022-09-26 LAB — URINE CULTURE

## 2022-10-12 ENCOUNTER — Other Ambulatory Visit: Payer: Self-pay | Admitting: Family Medicine

## 2022-10-12 DIAGNOSIS — J45901 Unspecified asthma with (acute) exacerbation: Secondary | ICD-10-CM

## 2022-10-13 NOTE — Telephone Encounter (Signed)
Called pt to make an appt.  - Pt was driving. Pt will call back to make an appt.

## 2022-10-13 NOTE — Telephone Encounter (Signed)
Courtesy refill. Pt will need an office visit for further refills. Requested Prescriptions  Pending Prescriptions Disp Refills   VENTOLIN HFA 108 (90 Base) MCG/ACT inhaler [Pharmacy Med Name: VENTOLIN HFA INH W/DOS CTR [id number]] 6.7 g 0    Sig: INHALE 2 PUFFS INTO THE LUNGS EVERY 6 HOURS AS NEEDED FOR WHEEZING OR SHORTNESS OF BREATH     Pulmonology:  [facility] Agonists 2 Failed - 10/12/2022  1:40 PM      Failed - Valid encounter within last 12 months    Recent Outpatient Visits           1 year ago Recurrent rhinosinusitis   [facility] [place], Earley Abide, MD   1 year ago Pharyngitis, unspecified etiology   [facility] [place], Earley Abide, MD   1 year ago Annual physical exam   [facility] [place], Earley Abide, MD   1 year ago Acute bacterial rhinosinusitis   [facility] [facility], Earley Abide, MD              Passed - Last BP in normal range    BP Readings from Last 1 Encounters:  09/24/22 110/70         Passed - Last Heart Rate in normal range    Pulse Readings from Last 1 Encounters:  08/13/22 94

## 2022-11-18 ENCOUNTER — Other Ambulatory Visit: Payer: Self-pay | Admitting: Obstetrics and Gynecology

## 2022-11-18 ENCOUNTER — Other Ambulatory Visit: Payer: Self-pay

## 2022-11-18 ENCOUNTER — Other Ambulatory Visit: Payer: Self-pay | Admitting: Family Medicine

## 2022-11-18 DIAGNOSIS — B9689 Other specified bacterial agents as the cause of diseases classified elsewhere: Secondary | ICD-10-CM

## 2022-11-18 DIAGNOSIS — J45901 Unspecified asthma with (acute) exacerbation: Secondary | ICD-10-CM

## 2022-11-18 MED ORDER — METRONIDAZOLE 500 MG PO TABS: ORAL_TABLET | ORAL | 0 refills | Status: DC

## 2022-11-25 ENCOUNTER — Encounter: Payer: Self-pay | Admitting: Family Medicine

## 2022-11-25 ENCOUNTER — Encounter: Payer: Medicaid Other | Admitting: Family Medicine

## 2022-11-26 ENCOUNTER — Ambulatory Visit: Payer: Medicaid Other

## 2023-01-19 ENCOUNTER — Encounter: Payer: Self-pay | Admitting: Family Medicine

## 2023-01-19 ENCOUNTER — Ambulatory Visit (INDEPENDENT_AMBULATORY_CARE_PROVIDER_SITE_OTHER): Payer: Medicaid Other | Admitting: Family Medicine

## 2023-01-19 VITALS — BP 122/70 | HR 97 | Ht 61.0 in | Wt 138.0 lb

## 2023-01-19 DIAGNOSIS — J45901 Unspecified asthma with (acute) exacerbation: Secondary | ICD-10-CM | POA: Diagnosis not present

## 2023-01-19 DIAGNOSIS — J309 Allergic rhinitis, unspecified: Secondary | ICD-10-CM

## 2023-01-19 MED ORDER — DULERA 100-5 MCG/ACT IN AERO: 2.0000 | INHALATION_SPRAY | Freq: Two times a day (BID) | RESPIRATORY_TRACT | 0 refills | Status: DC

## 2023-01-19 MED ORDER — PROMETHAZINE-DM 6.25-15 MG/5ML PO SYRP: 5.0000 mL | ORAL_SOLUTION | Freq: Four times a day (QID) | ORAL | 0 refills | Status: DC | PRN

## 2023-01-19 MED ORDER — ALBUTEROL SULFATE HFA 108 (90 BASE) MCG/ACT IN AERS: 2.0000 | INHALATION_SPRAY | Freq: Four times a day (QID) | RESPIRATORY_TRACT | 0 refills | Status: DC | PRN

## 2023-01-19 NOTE — Assessment & Plan Note (Signed)
Chronic, previously well-controlled while on maintenance inhaler, some recent increased need for PRN albuterol that has since subsided.  Cardiopulmonary exam benign.  - Continue current regimen

## 2023-01-19 NOTE — Assessment & Plan Note (Signed)
Chronic, prior surgery through ENT which helped for some time. Had recent few week non-productive cough. HEENT examination with clear fluid posterior TM bilaterally, mildly swollen, non-erythematous turbinates, oropharynx benign with nontender sinuses.  - Return to ENT for optimization - Interim antihistamine and intranasal steroid - Rx PRN antitussive

## 2023-01-19 NOTE — Patient Instructions (Signed)
-   Use Flonase and Zyrtec (generic OK) daily x 7 days, continue beyond 7 days while symptomatic - Touch base with ENT to schedule follow-up - Can use Rx cough medicine as-needed - Return for physical

## 2023-01-19 NOTE — Progress Notes (Signed)
     Primary Care / Sports Medicine Office Visit  Patient Information:  Patient ID: Natasha Sloan, female DOB: Aug 22, 1991 Age: 32 y.o. MRN: [id number]   Natasha Sloan is a pleasant 32 y.o. female presenting with the following:  Chief Complaint  Patient presents with   Asthma    Vitals:   01/19/23 1540  BP: 122/70  Pulse: 97  SpO2: 98%   Vitals:   01/19/23 1540  Weight: 138 lb (62.6 kg)  Height: 5\' 1"  (1.549 m)   Body mass index is 26.07 kg/m.  No results found.   Independent interpretation of notes and tests performed by another provider:   None  Procedures performed:   None  Pertinent History, Exam, Impression, and Recommendations:   Natasha Sloan was seen today for asthma.  Allergic rhinitis, unspecified seasonality, unspecified trigger Assessment & Plan: Chronic, prior surgery through ENT which helped for some time. Had recent few week non-productive cough. HEENT examination with clear fluid posterior TM bilaterally, mildly swollen, non-erythematous turbinates, oropharynx benign with nontender sinuses.  - Return to ENT for optimization - Interim antihistamine and intranasal steroid - Rx PRN antitussive   Moderate asthma with acute exacerbation, unspecified whether persistent Assessment & Plan: Chronic, previously well-controlled while on maintenance inhaler, some recent increased need for PRN albuterol that has since subsided.  Cardiopulmonary exam benign.  - Continue current regimen  Orders: -     Albuterol Sulfate HFA; Inhale 2 puffs into the lungs every 6 (six) hours as needed for wheezing or shortness of breath.  Dispense: 6.7 g; Refill: 0 -     Dulera; Inhale 2 puffs into the lungs in the morning and at bedtime.  Dispense: 1 each; Refill: 0  Other orders -     Promethazine-DM; Take 5 mLs by mouth 4 (four) times daily as needed for cough.  Dispense: 118 mL; Refill: 0     Orders & Medications Meds ordered this encounter  Medications   albuterol  (VENTOLIN HFA) 108 (90 Base) MCG/ACT inhaler    Sig: Inhale 2 puffs into the lungs every 6 (six) hours as needed for wheezing or shortness of breath.    Dispense:  6.7 g    Refill:  0    Courtesy refill. Pt will need an office visit for further refills.   mometasone-formoterol (DULERA) 100-5 MCG/ACT AERO    Sig: Inhale 2 puffs into the lungs in the morning and at bedtime.    Dispense:  1 each    Refill:  0   promethazine-dextromethorphan (PROMETHAZINE-DM) 6.25-15 MG/5ML syrup    Sig: Take 5 mLs by mouth 4 (four) times daily as needed for cough.    Dispense:  118 mL    Refill:  0   No orders of the defined types were placed in this encounter.    No follow-ups on file.     Jerrol Banana, MD, [facility]   Primary Care Sports Medicine [facility] at [facility]

## 2023-01-21 ENCOUNTER — Other Ambulatory Visit: Payer: Self-pay | Admitting: Obstetrics and Gynecology

## 2023-01-21 DIAGNOSIS — B9689 Other specified bacterial agents as the cause of diseases classified elsewhere: Secondary | ICD-10-CM

## 2023-01-24 MED ORDER — METRONIDAZOLE 500 MG PO TABS: ORAL_TABLET | ORAL | 0 refills | Status: DC

## 2023-01-29 ENCOUNTER — Ambulatory Visit: Payer: Medicaid Other | Admitting: Family Medicine

## 2023-02-01 ENCOUNTER — Ambulatory Visit: Payer: Medicaid Other | Admitting: Family Medicine

## 2023-02-03 ENCOUNTER — Ambulatory Visit (INDEPENDENT_AMBULATORY_CARE_PROVIDER_SITE_OTHER): Payer: Medicaid Other | Admitting: Physician Assistant

## 2023-02-03 ENCOUNTER — Encounter: Payer: Self-pay | Admitting: Physician Assistant

## 2023-02-03 ENCOUNTER — Ambulatory Visit: Payer: Medicaid Other | Admitting: Physician Assistant

## 2023-02-03 ENCOUNTER — Ambulatory Visit: Payer: Self-pay | Admitting: *Deleted

## 2023-02-03 VITALS — BP 100/62 | HR 79 | Temp 98.2°F | Ht 61.0 in | Wt 137.0 lb

## 2023-02-03 DIAGNOSIS — Z20818 Contact with and (suspected) exposure to other bacterial communicable diseases: Secondary | ICD-10-CM

## 2023-02-03 DIAGNOSIS — J029 Acute pharyngitis, unspecified: Secondary | ICD-10-CM | POA: Diagnosis not present

## 2023-02-03 LAB — POCT RAPID STREP A (OFFICE): Rapid Strep A Screen: NEGATIVE

## 2023-02-03 MED ORDER — PENICILLIN V POTASSIUM 500 MG PO TABS: 500.0000 mg | ORAL_TABLET | Freq: Two times a day (BID) | ORAL | 0 refills | Status: AC

## 2023-02-03 NOTE — Progress Notes (Signed)
Date:  02/03/2023   Name:  Natasha Sloan   DOB:  1990/11/10   MRN:  [id number]   Chief Complaint: Sore Throat and Ear Pain  Sore Throat  This is a new problem. Episode onset: X 4 days. The problem has been gradually worsening. The pain is worse on the left side. There has been no fever. The pain is at a severity of 8/10. Associated symptoms include coughing, ear pain and trouble swallowing. She has had exposure to strep. She has tried acetaminophen for the symptoms. The treatment provided mild relief.   Natasha Sloan is a very pleasant 32 year old female new to me today for evaluation of acute sore throat, typically sees my colleague Dr. Joseph Berkshire, MD.  No exposure to strep throat (sister) about 1 week ago.  Initial symptom 4 days ago was left-sided ear pain, followed by worsening sore throat over the last 48 hours with odynophagia even to liquids.  She also endorses a mild cough which just started a few hours ago   Medication list has been reviewed and updated.  Current Meds  Medication Sig   albuterol (VENTOLIN HFA) 108 (90 Base) MCG/ACT inhaler Inhale 2 puffs into the lungs every 6 (six) hours as needed for wheezing or shortness of breath.   Etonogestrel (IMPLANON [place]) Inject into the skin.   metroNIDAZOLE (FLAGYL) 500 MG tablet Take 1 tab BID for 7 days; NO alcohol use for 10 days after prescription start   mometasone-formoterol (DULERA) 100-5 MCG/ACT AERO Inhale 2 puffs into the lungs in the morning and at bedtime.   penicillin v potassium (VEETID) 500 MG tablet Take 1 tablet (500 mg total) by mouth in the morning and at bedtime for 10 days.     Review of Systems  HENT:  Positive for ear pain and trouble swallowing.   Respiratory:  Positive for cough.     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   Sepsis due to urinary tract  infection ([facility]) 05/30/2021   Severe sepsis with acute organ dysfunction ([facility]) 05/29/2021   Complicated UTI (urinary tract infection) 05/29/2021   Hypokalemia 05/29/2021   Hypomagnesemia 05/29/2021   Elevated LFTs 05/29/2021   History of postpartum hemorrhage 02/27/2021   GBS bacteriuria 01/03/2021   History of chlamydia 09/14/2020   R/O Posttraumatic stress disorder 03/29/2018   Asthma 09/09/2011    No Known Allergies  Immunization History  Administered Date(s) Administered   Influenza,inj,Quad PF,6+ Mos 05/30/2021   MMR 03/14/2021   Pneumococcal Polysaccharide-23 05/30/2021   Tdap 12/30/2020    Past Surgical History:  Procedure Laterality Date   EYE SURGERY Left 2007   "remove clot"   NASAL TURBINATE REDUCTION Bilateral 12/26/2021   Procedure: TURBINATE REDUCTION/SUBMUCOSAL RESECTION;  Surgeon: Linus Salmons, MD;  [facility];  Service: ENT;  Laterality: Bilateral;   ROOT CANAL     x1   SEPTOPLASTY N/A 12/26/2021   Procedure: SEPTOPLASTY;  Surgeon: Linus Salmons, MD;  Location: [facility];  Service: ENT;  Laterality: N/A;   WISDOM TOOTH EXTRACTION Bilateral 02/2021    Social History   Tobacco Use   Smoking status: Former    Types: Cigarettes, E-cigarettes   Smokeless tobacco: Never  Vaping Use   Vaping Use: Never used  Substance Use Topics   Alcohol use: Yes    Alcohol/week: 2.0 standard drinks of alcohol  Types: 2 Standard drinks or equivalent per week    Comment: last use 05/08/22 2x/mo   Drug use: Not Currently    Types: Cocaine, Marijuana    Comment: last use 2014    Family History  Problem Relation Age of Onset   Breast cancer Mother 1   Diabetes Mother    Diabetes Father         10/02/2021   10:54 AM 08/26/2021    4:30 PM 08/14/2021    3:17 PM 08/07/2021   11:54 AM  GAD 7 : Generalized Anxiety Score  Nervous, Anxious, on Edge 0 0 2 2  Control/stop worrying 0 0 1 0  Worry too much - different things 0 0 1 0   Trouble relaxing 0 0 1 2  Restless 0 0 0 0  Easily annoyed or irritable 0 0 0 0  Afraid - awful might happen 0 0 0 0  Total GAD 7 Score 0 0 5 4  Anxiety Difficulty Not difficult at all Not difficult at all Not difficult at all        10/02/2021   10:54 AM 08/26/2021    4:30 PM 08/14/2021    3:17 PM  Depression screen PHQ 2/9  Decreased Interest 0 0 0  Down, Depressed, Hopeless 0 0 0  PHQ - 2 Score 0 0 0  Altered sleeping 0 0 0  Tired, decreased energy 0 0 2  Change in appetite 0 0 0  Feeling bad or failure about yourself  0 0 0  Trouble concentrating 0 0 0  Moving slowly or fidgety/restless 0 0 0  Suicidal thoughts 0 0 0  PHQ-9 Score 0 0 2  Difficult doing work/chores Not difficult at all Not difficult at all Somewhat difficult    BP Readings from Last 3 Encounters:  02/03/23 100/62  01/19/23 122/70  09/24/22 110/70    Wt Readings from Last 3 Encounters:  02/03/23 137 lb (62.1 kg)  01/19/23 138 lb (62.6 kg)  09/24/22 142 lb (64.4 kg)    BP 100/62   Pulse 79   Temp 98.2 F (36.8 C) (Oral)   Ht 5\' 1"  (1.549 m)   Wt 137 lb (62.1 kg)   SpO2 99%   BMI 25.89 kg/m   Physical Exam Vitals and nursing note reviewed.  Constitutional:      General: She is not in acute distress.    Appearance: Normal appearance.  HENT:     Right Ear: A middle ear effusion is present. Tympanic membrane is not erythematous.     Left Ear: A middle ear effusion is present. Tympanic membrane is not erythematous.     Ears:     Comments: EAC clear bilaterally with good view of TM which is without effusion or erythema.     Nose: Congestion present.     Comments: Sinuses nontender    Mouth/Throat:     Mouth: Mucous membranes are dry.     Pharynx: Posterior oropharyngeal erythema (mild erythema posterior right) present. No oropharyngeal exudate.     Tonsils: No tonsillar exudate.  Eyes:     Pupils: Pupils are equal, round, and reactive to light.     Comments: Incidental finding of 5 mm  brown streak lower lateral bulbar conjunctiva of OD.  Patient since it has been there for a couple weeks, has already made eye appointment for this, possible slow resolving subconjunctival hemorrhage.  Cardiovascular:     Rate and Rhythm: Normal rate.  Pulmonary:  Effort: Pulmonary effort is normal.  Lymphadenopathy:     Cervical: Cervical adenopathy present.     Left cervical: Posterior cervical adenopathy present.     Recent Labs     Component Value Date/Time   NA 135 08/13/2022 1211   NA 140 11/25/2021 1352   NA 139 06/23/2012 0042   K 4.0 08/13/2022 1211   K 3.3 06/23/2012 0042   CL 107 08/13/2022 1211   CL 104 06/23/2012 0042   CO2 23 08/13/2022 1211   CO2 26 (H) 06/23/2012 0042   GLUCOSE 99 08/13/2022 1211   GLUCOSE 88 06/23/2012 0042   BUN 13 08/13/2022 1211   BUN 10 11/25/2021 1352   BUN 16 06/23/2012 0042   CREATININE 0.54 08/13/2022 1211   CREATININE 0.69 06/23/2012 0042   CALCIUM 8.9 08/13/2022 1211   CALCIUM 8.8 (L) 06/23/2012 0042   PROT 6.4 11/25/2021 1352   PROT 6.9 09/15/2011 1624   ALBUMIN 3.8 (L) 11/25/2021 1352   ALBUMIN 3.7 (L) 09/15/2011 1624   AST 20 11/25/2021 1352   AST 40 (H) 09/15/2011 1624   ALT 21 11/25/2021 1352   ALT 47 09/15/2011 1624   ALKPHOS 91 11/25/2021 1352   ALKPHOS 57 (L) 09/15/2011 1624   BILITOT 0.3 11/25/2021 1352   BILITOT 0.4 09/15/2011 1624   GFRNONAA >60 08/13/2022 1211   GFRNONAA >60 06/23/2012 0042   GFRAA >60 02/05/2020 2028   GFRAA >60 06/23/2012 0042    Lab Results  Component Value Date   WBC 5.7 08/13/2022   HGB 12.5 08/13/2022   HCT 38.4 08/13/2022   MCV 80.8 08/13/2022   PLT 262 08/13/2022   Lab Results  Component Value Date   HGBA1C 5.4 11/25/2021   Lab Results  Component Value Date   CHOL 140 11/25/2021   HDL 36 (L) 11/25/2021   LDLCALC 63 11/25/2021   TRIG 253 (H) 11/25/2021   CHOLHDL 3.9 11/25/2021   Lab Results  Component Value Date   TSH 1.690 11/25/2021     Assessment and  Plan:  1. Acute pharyngitis, unspecified etiology CENTOR score of 1 would suggest alternate etiology, but given her known exposure to strep and persistent odynophagia, will treat presumptively despite negative strep test today. - POCT rapid strep A - penicillin v potassium (VEETID) 500 MG tablet; Take 1 tablet (500 mg total) by mouth in the morning and at bedtime for 10 days.  Dispense: 20 tablet; Refill: 0  2. Exposure to strep throat CENTOR score of 1 would suggest alternate etiology, but given her known exposure to strep and persistent odynophagia, will treat presumptively despite negative strep test today. - penicillin v potassium (VEETID) 500 MG tablet; Take 1 tablet (500 mg total) by mouth in the morning and at bedtime for 10 days.  Dispense: 20 tablet; Refill: 0   Return if symptoms worsen or fail to improve.   Partially dictated using Animal nutritionist. Any errors are unintentional.  Alvester Morin, PA-C, DMSc, Nutritionist [facility] [phone]

## 2023-02-03 NOTE — Telephone Encounter (Signed)
Reason for Disposition . Earache also present  Answer Assessment - Initial Assessment Questions 1. ONSET: "When did the throat start hurting?" (Hours or days ago)      Painful to swallow 2. SEVERITY: "How bad is the sore throat?" (Scale 1-10; mild, moderate or severe)   - MILD (1-3):  Doesn't interfere with eating or normal activities.   - MODERATE (4-7): Interferes with eating some solids and normal activities.   - SEVERE (8-10):  Excruciating pain, interferes with most normal activities.   - SEVERE WITH DYSPHAGIA (10): Can't swallow liquids, drooling.     severe 3. 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  VIRAL SYMPTOMS: "Are there any symptoms of a cold, such as a runny nose, cough, hoarse voice or red eyes?"      Ear pain- left 5. FEVER: "Do you have a fever?" If Yes, ask: "What is your temperature, how was it measured, and when did it start?"     Today feels clammy 6. PUS ON THE TONSILS: "Is there pus on the tonsils in the back of your throat?"     White patched present 7. OTHER SYMPTOMS: "Do you have any other symptoms?" (e.g., difficulty breathing, headache, rash)     Hard to swallow  Protocols used: Sore Throat-A-AH

## 2023-02-03 NOTE — Telephone Encounter (Signed)
  Chief Complaint: sore throat, ear pain Symptoms: recent strep exposure- sore throat- hurts to swallow, left ear pain Frequency: ear pain started 1 week ago- sore throat more recent Pertinent Negatives: Patient denies fever Disposition: [] ED /[] Urgent Care (no appt availability in office) / [x] Appointment(In office/virtual)/ []  [facility]/ [] Home Care/ [] Refused Recommended Disposition /[] [facility]/ []  Follow-up with PCP Additional Notes: Patient states she was in recently with ear pain- but she was exposed to strep at work last week and now her throat is red with white patches and painful to swallow. Patient is requesting medication/appointment.

## 2023-02-03 NOTE — Patient Instructions (Signed)
-  It was a pleasure to see you today! Please review your visit summary for helpful information -I would encourage you to follow your care via MyChart where you can access lab results, notes, messages, and more -If you feel that we did a nice job today, please complete your after-visit survey and leave us a Google review! Your CMA today was Kieandra and your provider was Dan Taeler Winning, PA-C, DMSc   

## 2023-02-18 ENCOUNTER — Ambulatory Visit (INDEPENDENT_AMBULATORY_CARE_PROVIDER_SITE_OTHER): Payer: Medicaid Other | Admitting: Family Medicine

## 2023-02-18 ENCOUNTER — Encounter: Payer: Self-pay | Admitting: Family Medicine

## 2023-02-18 VITALS — BP 118/60 | HR 78 | Ht 61.0 in | Wt 137.0 lb

## 2023-02-18 DIAGNOSIS — J309 Allergic rhinitis, unspecified: Secondary | ICD-10-CM | POA: Diagnosis not present

## 2023-02-18 DIAGNOSIS — Z0000 Encounter for general adult medical examination without abnormal findings: Secondary | ICD-10-CM | POA: Diagnosis not present

## 2023-02-18 DIAGNOSIS — Z13220 Encounter for screening for lipoid disorders: Secondary | ICD-10-CM

## 2023-02-18 DIAGNOSIS — J45901 Unspecified asthma with (acute) exacerbation: Secondary | ICD-10-CM

## 2023-02-18 DIAGNOSIS — E559 Vitamin D deficiency, unspecified: Secondary | ICD-10-CM

## 2023-02-18 MED ORDER — ALBUTEROL SULFATE HFA 108 (90 BASE) MCG/ACT IN AERS: 2.0000 | INHALATION_SPRAY | Freq: Four times a day (QID) | RESPIRATORY_TRACT | 3 refills | Status: DC | PRN

## 2023-02-18 MED ORDER — DULERA 100-5 MCG/ACT IN AERO: 2.0000 | INHALATION_SPRAY | Freq: Two times a day (BID) | RESPIRATORY_TRACT | 3 refills | Status: DC

## 2023-02-18 NOTE — Assessment & Plan Note (Signed)
Well-controlled current symptoms

## 2023-02-18 NOTE — Progress Notes (Signed)
Annual Physical Exam Visit  Patient Information:  Patient ID: Natasha Sloan, female DOB: 02-Mar-1991 Age: 32 y.o. MRN: [id number]   Subjective:   CC: Annual Physical Exam  HPI:  Natasha Sloan is here for their annual physical.  I reviewed the past medical history, family history, social history, surgical history, and allergies today and changes were made as necessary.  Please see the problem list section below for additional details.  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   Sepsis due to urinary tract infection (HCC) 05/30/2021   Past Surgical History: Past Surgical History:  Procedure Laterality Date   EYE SURGERY Left 2007   "remove clot"   NASAL TURBINATE REDUCTION Bilateral 12/26/2021   Procedure: TURBINATE REDUCTION/SUBMUCOSAL RESECTION;  Surgeon: Linus Salmons, MD;  Location[facility];  Service: ENT;  Laterality: Bilateral;   ROOT CANAL     x1   SEPTOPLASTY N/A 12/26/2021   Procedure: SEPTOPLASTY;  Surgeon: Linus Salmons, MD;  Location: [facility];  Service: ENT;  Laterality: N/A;   WISDOM TOOTH EXTRACTION Bilateral 02/2021   Family History: Family History  Problem Relation Age of Onset   Breast cancer Mother 17   Diabetes Mother    Diabetes Father    Allergies: No Known Allergies Health Maintenance: Health Maintenance  Topic Date Due   COVID-19 Vaccine (1) Never done   PAP-Cervical Cytology Screening  03/18/2021   PAP SMEAR-Modifier  03/18/2021   INFLUENZA VACCINE  04/01/2023   DTaP/Tdap/Td (2 - Td or  Tdap) 12/31/2030   Hepatitis C Screening  Completed   HIV Screening  Completed   HPV VACCINES  Aged Out    HM Colonoscopy     This patient has no relevant Health Maintenance data.      Medications: Current Outpatient Medications on File Prior to Visit  Medication Sig Dispense Refill   Etonogestrel (IMPLANON [place]) Inject into the skin.     No current facility-administered medications on file prior to visit.    Review of Systems: No headache, visual changes, nausea, vomiting, diarrhea, constipation, dizziness, abdominal pain, skin rash, fevers, chills, night sweats, swollen lymph nodes, weight loss, chest pain, body aches, joint swelling, muscle aches, shortness of breath, mood changes, visual or auditory hallucinations reported.  Objective:   Vitals:   02/18/23 1401  BP: 118/60  Pulse: 78  SpO2: 99%   Vitals:   02/18/23 1401  Weight: 137 lb (62.1 kg)  Height: 5\' 1"  (1.549 m)   Body mass index is 25.89 kg/m.  General: Well Developed, well nourished, and in no acute distress.  Neuro: Alert and oriented x3, extra-ocular muscles intact, sensation grossly intact. Cranial nerves II through XII are grossly intact, motor, sensory, and coordinative functions are intact. HEENT: Normocephalic, atraumatic, pupils equal round reactive to light, neck supple, no masses, no lymphadenopathy, thyroid nonpalpable. Oropharynx, nasopharynx, external ear canals are unremarkable. Skin: Warm and dry, no rashes noted.  Cardiac: Regular rate and rhythm, no murmurs rubs or gallops. No peripheral edema. Pulses symmetric. Respiratory:  Clear to auscultation bilaterally. Not using accessory muscles, speaking in full sentences.  Abdominal: Soft, nontender, nondistended, positive bowel sounds, no masses, no organomegaly. Musculoskeletal: Shoulder, elbow, wrist, hip, knee, ankle stable, and with full range of motion.  Female chaperone initials: AH present throughout the physical examination.  Impression  and Recommendations:   The patient was counselled, risk factors were discussed, and anticipatory guidance given.  Problem List Items Addressed This Visit       Respiratory   Asthma    Chronic, well-controlled on current regimen, and frequent usage of rescue inhaler.  -Continue current regimen      Relevant Medications   mometasone-formoterol (DULERA) 100-5 MCG/ACT AERO   albuterol (VENTOLIN HFA) 108 (90 Base) MCG/ACT inhaler   Allergic rhinitis    Well-controlled current symptoms        Other   Healthcare maintenance - Primary   Relevant Orders   Comprehensive metabolic panel   CBC   TSH   Lipid panel   VITAMIN D 25 Hydroxy (Vit-D Deficiency, Fractures)   Urinalysis   Other Visit Diagnoses     Screening for lipoid disorders       Relevant Orders   Comprehensive metabolic panel   Lipid panel   Vitamin D deficiency       Relevant Orders   VITAMIN D 25 Hydroxy (Vit-D Deficiency, Fractures)   Annual physical exam       Relevant Orders   Comprehensive metabolic panel   CBC   TSH   Lipid panel   VITAMIN D 25 Hydroxy (Vit-D Deficiency, Fractures)   Urinalysis        Orders & Medications Medications:  Meds ordered this encounter  Medications   mometasone-formoterol (DULERA) 100-5 MCG/ACT AERO    Sig: Inhale 2 puffs into the lungs in the morning and at bedtime.    Dispense:  1 each    Refill:  3   albuterol (VENTOLIN HFA) 108 (90 Base) MCG/ACT inhaler    Sig: Inhale 2 puffs into the lungs every 6 (six) hours as needed for wheezing or shortness of breath.    Dispense:  6.7 g    Refill:  3    Courtesy refill. Pt will need an office visit for further refills.   Orders Placed This Encounter  Procedures   Comprehensive metabolic panel   CBC   TSH   Lipid panel   VITAMIN D 25 Hydroxy (Vit-D Deficiency, Fractures)   Urinalysis     No follow-ups on file.    Jerrol Banana, MD, [facility]   Primary Care Sports Medicine Primary Care and Sports Medicine at  [facility]

## 2023-02-18 NOTE — Assessment & Plan Note (Signed)
Chronic, well-controlled on current regimen, and frequent usage of rescue inhaler.  -Continue current regimen

## 2023-02-18 NOTE — Patient Instructions (Addendum)
-   Obtain fasting labs with orders provided (can have water or black coffee but otherwise no food or drink x 8 hours before labs) - Review information provided - Attend eye doctor annually, dentist every 6 months, work towards or maintain 30 minutes of moderate intensity physical activity at least 5 days per week, and consume a balanced diet - Return in 1 year for physical - Contact us for any questions between now and then  Additionally: - Reach out to your gynecologist about cervical cancer screening (Pap test)

## 2023-03-03 DIAGNOSIS — E559 Vitamin D deficiency, unspecified: Secondary | ICD-10-CM | POA: Diagnosis not present

## 2023-03-03 DIAGNOSIS — Z13220 Encounter for screening for lipoid disorders: Secondary | ICD-10-CM | POA: Diagnosis not present

## 2023-03-03 DIAGNOSIS — Z0000 Encounter for general adult medical examination without abnormal findings: Secondary | ICD-10-CM | POA: Diagnosis not present

## 2023-03-04 LAB — URINALYSIS
Bilirubin, UA: NEGATIVE
Glucose, UA: NEGATIVE
Ketones, UA: NEGATIVE
Leukocytes,UA: NEGATIVE
Nitrite, UA: NEGATIVE
Specific Gravity, UA: >=1.03 — AB (ref 1.005–1.030)
Urobilinogen, Ur: 1 mg/dL (ref 0.2–1.0)
pH, UA: 6 (ref 5.0–7.5)

## 2023-03-04 LAB — LIPID PANEL
Chol/HDL Ratio: 3.4 ratio (ref 0.0–4.4)
Cholesterol, Total: 140 mg/dL (ref 100–199)
HDL: 41 mg/dL (ref >39)
LDL Chol Calc (NIH): 76 mg/dL (ref 0–99)
Triglycerides: 129 mg/dL (ref 0–149)
VLDL Cholesterol Cal: 23 mg/dL (ref 5–40)

## 2023-03-04 LAB — COMPREHENSIVE METABOLIC PANEL
ALT: 15 IU/L (ref 0–32)
AST: 17 IU/L (ref 0–40)
Albumin: 4.5 g/dL (ref 4.0–5.0)
Alkaline Phosphatase: 66 IU/L (ref 44–121)
BUN/Creatinine Ratio: 17 (ref 9–23)
BUN: 11 mg/dL (ref 6–20)
Bilirubin Total: 0.5 mg/dL (ref 0.0–1.2)
CO2: 24 mmol/L (ref 20–29)
Calcium: 8.5 mg/dL — ABNORMAL LOW (ref 8.7–10.2)
Chloride: 104 mmol/L (ref 96–106)
Creatinine, Ser: 0.66 mg/dL (ref 0.57–1.00)
Globulin, Total: 2.6 g/dL (ref 1.5–4.5)
Glucose: 93 mg/dL (ref 70–99)
Potassium: 3.5 mmol/L (ref 3.5–5.2)
Sodium: 139 mmol/L (ref 134–144)
Total Protein: 7.1 g/dL (ref 6.0–8.5)
eGFR: 122 mL/min/{1.73_m2} (ref >59)

## 2023-03-04 LAB — CBC
Hematocrit: 37.1 % (ref 34.0–46.6)
Hemoglobin: 11.8 g/dL (ref 11.1–15.9)
MCH: 26 pg — ABNORMAL LOW (ref 26.6–33.0)
MCHC: 31.8 g/dL (ref 31.5–35.7)
MCV: 82 fL (ref 79–97)
Platelets: 265 10*3/uL (ref 150–450)
RBC: 4.54 x10E6/uL (ref 3.77–5.28)
RDW: 16.6 % — ABNORMAL HIGH (ref 11.7–15.4)
WBC: 3.8 10*3/uL (ref 3.4–10.8)

## 2023-03-04 LAB — TSH: TSH: 2.2 u[IU]/mL (ref 0.450–4.500)

## 2023-03-04 LAB — VITAMIN D 25 HYDROXY (VIT D DEFICIENCY, FRACTURES): Vit D, 25-Hydroxy: 21.6 ng/mL — ABNORMAL LOW (ref 30.0–100.0)

## 2023-03-11 ENCOUNTER — Other Ambulatory Visit: Payer: Self-pay | Admitting: Family Medicine

## 2023-03-11 MED ORDER — VITAMIN D (ERGOCALCIFEROL) 1.25 MG (50000 UNIT) PO CAPS: 50000.0000 [IU] | ORAL_CAPSULE | ORAL | 0 refills | Status: DC

## 2023-03-13 DIAGNOSIS — R030 Elevated blood-pressure reading, without diagnosis of hypertension: Secondary | ICD-10-CM | POA: Diagnosis not present

## 2023-03-13 DIAGNOSIS — J45909 Unspecified asthma, uncomplicated: Secondary | ICD-10-CM | POA: Diagnosis not present

## 2023-03-13 DIAGNOSIS — Z87440 Personal history of urinary (tract) infections: Secondary | ICD-10-CM | POA: Diagnosis not present

## 2023-03-25 DIAGNOSIS — H52223 Regular astigmatism, bilateral: Secondary | ICD-10-CM | POA: Diagnosis not present

## 2023-03-31 ENCOUNTER — Telehealth: Payer: Self-pay | Admitting: Obstetrics and Gynecology

## 2023-03-31 NOTE — Telephone Encounter (Signed)
I called patient to get scheduled for annual with [facility] on August 12@ 3:55pm but had to leave message. So I asked if she would give [place] a call back and I have a block on the spot with her name on it.

## 2023-04-02 ENCOUNTER — Other Ambulatory Visit: Payer: Self-pay | Admitting: Physician Assistant

## 2023-04-02 DIAGNOSIS — H5213 Myopia, bilateral: Secondary | ICD-10-CM | POA: Diagnosis not present

## 2023-04-02 DIAGNOSIS — J029 Acute pharyngitis, unspecified: Secondary | ICD-10-CM

## 2023-04-02 DIAGNOSIS — Z20818 Contact with and (suspected) exposure to other bacterial communicable diseases: Secondary | ICD-10-CM

## 2023-04-02 NOTE — Telephone Encounter (Signed)
Requested medication (s) are due for refill today -no  Requested medication (s) are on the active medication list -no  Future visit scheduled -no  Last refill: 02/03/23  Notes to clinic: off protocol- provider review   Requested Prescriptions  Pending Prescriptions Disp Refills   penicillin v potassium (VEETID) 500 MG tablet [Pharmacy Med Name: PENICILLIN VK 500MG  TABLETS] 20 tablet 0    Sig: TAKE 1 TABLET(500 MG) BY MOUTH IN THE MORNING AND AT BEDTIME FOR 10 DAYS     Off-Protocol Failed - 04/02/2023  8:57 AM      Failed - Medication not assigned to a protocol, review manually.      Passed - Valid encounter within last 12 months    Recent Outpatient Visits           1 month ago Healthcare maintenance   [place] [facility] at [facility], Ocie Bob, MD   1 month ago Acute pharyngitis, unspecified etiology   [place] [facility] at [facility], [place], [place]   2 months ago Allergic rhinitis, unspecified seasonality, unspecified trigger   [place] [facility] at [facility], Ocie Bob, MD   1 year ago Recurrent rhinosinusitis   [place] [facility] at [facility], Ocie Bob, MD   1 year ago Pharyngitis, unspecified etiology   [place] [facility] at [facility], Ocie Bob, MD                 Requested Prescriptions  Pending Prescriptions Disp Refills   penicillin v potassium (VEETID) 500 MG tablet [Pharmacy Med Name: PENICILLIN VK 500MG  TABLETS] 20 tablet 0    Sig: TAKE 1 TABLET(500 MG) BY MOUTH IN THE MORNING AND AT BEDTIME FOR 10 DAYS     Off-Protocol Failed - 04/02/2023  8:57 AM      Failed - Medication not assigned to a protocol, review manually.      Passed - Valid encounter within last 12 months    Recent Outpatient Visits           1 month ago Healthcare maintenance   [place] [facility] at [facility], Ocie Bob, MD   1 month ago Acute pharyngitis, unspecified etiology   [place] [facility] at [facility], [place], [place]   2 months ago Allergic rhinitis, unspecified seasonality, unspecified trigger   [place] [facility] at [facility], Ocie Bob, MD   1 year ago Recurrent rhinosinusitis   [place] [facility] at [facility], Ocie Bob, MD   1 year ago Pharyngitis, unspecified etiology   [facility] at [facility], Ocie Bob, MD

## 2023-04-06 ENCOUNTER — Ambulatory Visit: Payer: Medicaid Other

## 2023-04-08 ENCOUNTER — Ambulatory Visit: Payer: Medicaid Other | Admitting: Family Medicine

## 2023-04-11 NOTE — Progress Notes (Deleted)
PCP:  Jerrol Banana, MD   No chief complaint on file.    HPI:      Natasha Sloan is a 32 y.o. [phone] whose LMP was No LMP recorded. Patient has had an implant., presents today for her annual examination.  Her menses are {norm/abn:715}, lasting {number: 22536} days.  Dysmenorrhea {dysmen:716}. She {does:18564} have intermenstrual bleeding.  Sex activity: {sex active: 315163}. Nexplanon placed 06/06/21 Last Pap: {WGNF:621308657}  Results were: {norm/abn:16707::"no abnormalities"} /neg HPV DNA *** Hx of STDs: {STD hx:14358}  Last mammogram: {date:304500300}  Results were: {norm/abn:13465} There is no FH of breast cancer. There is no FH of ovarian cancer. The patient {does:18564} do self-breast exams.  Tobacco use: {tob:20664} Alcohol use: {Alcohol:11675} No drug use.  Exercise: {exercise:31265}  She {does:18564} get adequate calcium and Vitamin D in her diet.  Patient Active Problem List   Diagnosis Date Noted   Healthcare maintenance 02/18/2023   Allergic rhinitis 01/19/2023   R/O Posttraumatic stress disorder 03/29/2018   Asthma 09/09/2011    Past Surgical History:  Procedure Laterality Date   EYE SURGERY Left 2007   "remove clot"   NASAL TURBINATE REDUCTION Bilateral 12/26/2021   Procedure: TURBINATE REDUCTION/SUBMUCOSAL RESECTION;  Surgeon: Linus Salmons, MD;  Location: [facility];  Service: ENT;  Laterality: Bilateral;   ROOT CANAL     x1   SEPTOPLASTY N/A 12/26/2021   Procedure: SEPTOPLASTY;  Surgeon: Linus Salmons, MD;  Location: [facility];  Service: ENT;  Laterality: N/A;   WISDOM TOOTH EXTRACTION Bilateral 02/2021    Family History  Problem Relation Age of Onset   Breast cancer Mother 83   Diabetes Mother    Diabetes Father     Social History   Socioeconomic History   Marital status: Single    Spouse name: Not on file   Number of children: 2   Years of education: 12+   Highest education level: Associate degree:  occupational, Scientist, product/process development, or vocational program  Occupational History   Not on file  Tobacco Use   Smoking status: Former    Types: Cigarettes, E-cigarettes   Smokeless tobacco: Never  Vaping Use   Vaping status: Never Used  Substance and Sexual Activity   Alcohol use: Yes    Alcohol/week: 2.0 standard drinks of alcohol    Types: 2 Standard drinks or equivalent per week    Comment: last use 05/08/22 2x/mo   Drug use: Not Currently    Types: Cocaine, Marijuana    Comment: last use 2014   Sexual activity: Yes    Partners: Male    Birth control/protection: Implant, Condom  Other Topics Concern   Not on file  Social History Narrative   Not on file   Social Determinants of Health   Financial Resource Strain: Low Risk  (02/01/2023)   Overall Financial Resource Strain (CARDIA)    Difficulty of Paying Living Expenses: Not very hard  Food Insecurity: No Food Insecurity (02/01/2023)   Hunger Vital Sign    Worried About Running Out of Food in the Last Year: Never true    Ran Out of Food in the Last Year: Never true  Transportation Needs: No Transportation Needs (08/14/2022)   PRAPARE - Administrator, Civil Service (Medical): No    Lack of Transportation (Non-Medical): No  Physical Activity: Insufficiently Active (02/01/2023)   Exercise Vital Sign    Days of Exercise per Week: 4 days    Minutes of Exercise per Session: 30 min  Stress: No Stress Concern Present (02/01/2023)   [employer] of Occupational Health - Occupational Stress Questionnaire    Feeling of Stress : Only a little  Social Connections: Socially Isolated (02/01/2023)   Social Connection and Isolation Panel [NHANES]    Frequency of Communication with Friends and Family: Three times a week    Frequency of Social Gatherings with Friends and Family: Once a week    Attends Religious Services: Never    Database administrator or Organizations: No    Attends Engineer, structural: Not on file    Marital  Status: Never married  Intimate Partner Violence: Not At Risk (08/28/2020)   Humiliation, Afraid, Rape, and Kick questionnaire    Fear of Current or Ex-Partner: No    Emotionally Abused: No    Physically Abused: No    Sexually Abused: No     Current Outpatient Medications:    albuterol (VENTOLIN HFA) 108 (90 Base) MCG/ACT inhaler, Inhale 2 puffs into the lungs every 6 (six) hours as needed for wheezing or shortness of breath., Disp: 6.7 g, Rfl: 3   Etonogestrel (IMPLANON ), Inject into the skin., Disp: , Rfl:    mometasone-formoterol (DULERA) 100-5 MCG/ACT AERO, Inhale 2 puffs into the lungs in the morning and at bedtime., Disp: 1 each, Rfl: 3   Vitamin D, Ergocalciferol, (DRISDOL) 1.25 MG (50000 UNIT) CAPS capsule, Take 1 capsule (50,000 Units total) by mouth every 7 (seven) days. Take for 8 total doses(weeks), Disp: 8 capsule, Rfl: 0     ROS:  Review of Systems BREAST: No symptoms   Objective: There were no vitals taken for this visit.   OBGyn Exam  Results: No results found for this or any previous visit (from the past 24 hour(s)).  Assessment/Plan: No diagnosis found.  No orders of the defined types were placed in this encounter.            GYN counsel {counseling: 16159}     F/U  No follow-ups on file.   B. , PA-C 04/11/2023 9:35 PM

## 2023-04-12 ENCOUNTER — Ambulatory Visit: Payer: Medicaid Other | Admitting: Obstetrics and Gynecology

## 2023-04-12 DIAGNOSIS — Z01419 Encounter for gynecological examination (general) (routine) without abnormal findings: Secondary | ICD-10-CM

## 2023-04-12 DIAGNOSIS — Z124 Encounter for screening for malignant neoplasm of cervix: Secondary | ICD-10-CM

## 2023-04-12 DIAGNOSIS — Z3046 Encounter for surveillance of implantable subdermal contraceptive: Secondary | ICD-10-CM

## 2023-04-19 ENCOUNTER — Encounter: Payer: Self-pay | Admitting: Family Medicine

## 2023-04-19 ENCOUNTER — Ambulatory Visit: Payer: Medicaid Other | Admitting: Family Medicine

## 2023-04-19 ENCOUNTER — Encounter: Payer: Medicaid Other | Admitting: Family Medicine

## 2023-04-19 VITALS — BP 111/66 | Ht 61.0 in | Wt 140.8 lb

## 2023-04-19 DIAGNOSIS — Z309 Encounter for contraceptive management, unspecified: Secondary | ICD-10-CM

## 2023-04-19 DIAGNOSIS — Z01419 Encounter for gynecological examination (general) (routine) without abnormal findings: Secondary | ICD-10-CM | POA: Diagnosis not present

## 2023-04-19 DIAGNOSIS — Z3046 Encounter for surveillance of implantable subdermal contraceptive: Secondary | ICD-10-CM

## 2023-04-19 DIAGNOSIS — Z3009 Encounter for other general counseling and advice on contraception: Secondary | ICD-10-CM

## 2023-04-19 DIAGNOSIS — Z113 Encounter for screening for infections with a predominantly sexual mode of transmission: Secondary | ICD-10-CM

## 2023-04-19 LAB — WET PREP FOR TRICH, YEAST, CLUE
Trichomonas Exam: NEGATIVE
Yeast Exam: NEGATIVE

## 2023-04-19 LAB — HM HIV SCREENING LAB: HM HIV Screening: NEGATIVE

## 2023-04-19 NOTE — Progress Notes (Signed)
[facility] DEPARTMENT [facility] [address] Main Number: [phone]  Family Planning Visit- Repeat Yearly Visit  Subjective:  Natasha Sloan is a 32 y.o. [id number]  being seen today for an annual wellness visit and to discuss contraception options.   The patient is currently using Hormonal Implant for pregnancy prevention. Patient does not want a pregnancy in the next year.    report they are looking for a method that provides Other declines other BCM at this time   Patient has the following medical problems: has R/O Posttraumatic stress disorder; Asthma; Allergic rhinitis; and Healthcare maintenance on their problem list.  Chief Complaint  Patient presents with   Annual Exam    Nex/removal    Patient reports to clinic for nexplanon removal. Declines other BCM at this time. Reports heavy bleeding for 12-14 days per month and states that she has already tried ocps with the nexplanon which didn't help. Reports wanting to "give my body a break" before changing to the IUD  See flowsheet for other program required questions.   Body mass index is 26.6 kg/m. - Patient is eligible for diabetes screening based on BMI> 25 and age >35?  no HA1C ordered? not applicable  Patient reports 1 of partners in last year. Desires STI screening?  Yes   Has patient been screened once for HCV in the past?  No  No results found for: "HCVAB"  Does the patient have current of drug use, have a partner with drug use, and/or has been incarcerated since last result? No  If yes-- Screen for HCV through [facility]   Does the patient meet criteria for HBV testing? No  Criteria:  -Household, sexual or needle sharing contact with HBV -History of drug use -HIV positive -Those with known Hep C   Health Maintenance Due  Topic Date Due   PAP-Cervical Cytology Screening  03/18/2021   PAP SMEAR-Modifier  03/18/2021   COVID-19 Vaccine (1 - 2023-24 season) Never done    INFLUENZA VACCINE  04/01/2023    Review of Systems  Constitutional:  Negative for weight loss.  Eyes:  Negative for blurred vision.  Respiratory:  Negative for cough and shortness of breath.   Cardiovascular:  Negative for claudication.  Gastrointestinal:  Positive for nausea.  Genitourinary:  Negative for dysuria and frequency.  Skin:  Negative for rash.  Neurological:  Positive for dizziness. Negative for headaches.  Endo/Heme/Allergies:  Does not bruise/bleed easily.    The following portions of the patient's history were reviewed and updated as appropriate: allergies, current medications, past family history, past medical history, past social history, past surgical history and problem list. Problem list updated.  Objective:   Vitals:   04/19/23 1321  BP: 111/66  Weight: 140 lb 12.8 oz (63.9 kg)  Height: 5\' 1"  (1.549 m)    Physical Exam Vitals and nursing note reviewed.  Constitutional:      Appearance: Normal appearance.  HENT:     Head: Normocephalic and atraumatic.     Mouth/Throat:     Mouth: Mucous membranes are moist.     Pharynx: Oropharynx is clear. No oropharyngeal exudate or posterior oropharyngeal erythema.  Pulmonary:     Effort: Pulmonary effort is normal.  Chest:  Breasts:    Tanner Score is 5.     Right: Normal. No mass, nipple discharge or skin change.     Left: Normal. No mass, nipple discharge or skin change.  Abdominal:  General: Abdomen is flat.     Palpations: There is no mass.     Tenderness: There is no abdominal tenderness. There is no rebound.  Genitourinary:    General: Normal vulva.     Exam position: Lithotomy position.     Pubic Area: No rash or pubic lice.      Labia:        Right: No rash or lesion.        Left: No rash or lesion.      Vagina: Normal. No vaginal discharge, erythema, bleeding or lesions.     Cervix: Discharge and friability present. No cervical motion tenderness, lesion or erythema.     Uterus: Normal.       Adnexa: Right adnexa normal and left adnexa normal.     Rectum: Normal.     Comments: pH = 5  Mild amt of white discharge present Lymphadenopathy:     Head:     Right side of head: No preauricular or posterior auricular adenopathy.     Left side of head: No preauricular or posterior auricular adenopathy.     Cervical: No cervical adenopathy.     Upper Body:     Right upper body: No supraclavicular, axillary or epitrochlear adenopathy.     Left upper body: No supraclavicular, axillary or epitrochlear adenopathy.     Lower Body: No right inguinal adenopathy. No left inguinal adenopathy.  Skin:    General: Skin is warm and dry.     Findings: No rash.  Neurological:     Mental Status: She is alert and oriented to person, place, and time.       Assessment and Plan:  Natasha Sloan is a 32 y.o. female [phone] presenting to the [facility] Department for an yearly wellness and contraception visit  1. Family planning Contraception counseling: Reviewed options based on patient desire and reproductive life plan. Patient is interested in No method. This was provided to the patient today.   Risks, benefits, and typical effectiveness rates were reviewed.  Questions were answered.  Written information was also given to the patient to review.    The patient will follow up in  1 years for surveillance.  The patient was told to call with any further questions, or with any concerns about this method of contraception.  Emphasized use of condoms 100% of the time for STI prevention.  Educated on ECP and assessed need for ECP. Not indicated- Last sex 03/04/23  2. Well woman exam with routine gynecological exam -repeat pap today -CBE today (normal) next due 03/2026 -reports occasional dizziness and nausea- review of hx suggests patient has hx of anemia- counseled pt on taking a multivitamin with iron supplementation   - IGP, rfx Aptima HPV ASCU  3. Screening for venereal disease   -  Chlamydia/Gonorrhea [place] [facility] - HIV [place] [facility] - Syphilis Serology, [place] [facility] - WET PREP FOR TRICH, YEAST, CLUE  4. Encounter for Nexplanon removal Nexplanon Removal Patient identified, informed consent performed, consent signed.   Appropriate time out taken. Nexplanon site identified.  Area prepped in usual sterile fashon. 3 ml of 1% lidocaine with Epinephrine was used to anesthetize the area at the distal end of the implant and along implant site. A small stab incision was made right beside the implant on the distal portion.  The Nexplanon rod was grasped using hemostats/manual and removed without difficulty.  There was minimal blood loss. There were no complications.  Steri-strips were  applied over the small incision.  A pressure bandage was applied to reduce any bruising.  The patient tolerated the procedure well and was given post procedure instructions.     Nexplanon:   Counseled patient to take OTC analgesic starting as soon as lidocaine starts to wear off and take regularly for at least 48 hr to decrease discomfort.  Specifically to take with food or milk to decrease stomach upset and for IB 600 mg (3 tablets) every 6 hrs; IB 800 mg (4 tablets) every 8 hrs; or Aleve 2 tablets every 12 hrs.     Return in about 1 year (around 04/18/2024), or if symptoms worsen or fail to improve, for annual well-woman exam.  Future Appointments  Date Time Provider Department Center  05/07/2023  1:15 PM Copland, Ilona Sorrel, PA-C [facility] None    Lenice Llamas, [place]

## 2023-04-19 NOTE — Progress Notes (Unsigned)
Pt here for annual physical exam, pap smear and Nexplanon removal.  Wet mount results reviewed with patient. No treatment needed as per standing orders.  Nexplanon removed from left arm by Aliene Altes, FNP without complications.  Pt declines birth control method at this time.  Considering IUD in next few months.  Condoms declined.  Family planning packet provided.-Collins Scotland, RN

## 2023-04-20 NOTE — Progress Notes (Signed)
Erroneous encounter -please see other chart

## 2023-04-20 NOTE — Progress Notes (Signed)
Pt here for annual physical exam, pap smear and Nexplanon removal.  Wet mount results reviewed with patient.  No treatment needed as per standing orders.  Nexplanon removed from left arm by Aliene Altes, FNP without complications.  Pt declines birth control method at this time. Considering IUD in the next few months.  Condoms declined.  Family planning packet provided.- Collins Scotland, RN

## 2023-04-21 LAB — IGP, RFX APTIMA HPV ASCU: PAP Smear Comment: 0

## 2023-05-07 ENCOUNTER — Ambulatory Visit: Payer: Medicaid Other | Admitting: Obstetrics and Gynecology

## 2023-06-01 ENCOUNTER — Telehealth: Payer: Self-pay | Admitting: Licensed Clinical Social Worker

## 2023-06-01 NOTE — Telephone Encounter (Signed)
Patient lvm requesting an appt. LCSW returned call and discussed virtual appointments with patient. Patient to come in to sign consent and call to schedule appointment.

## 2023-07-28 ENCOUNTER — Ambulatory Visit: Payer: Self-pay

## 2023-07-28 NOTE — Telephone Encounter (Signed)
  Chief Complaint: diarrhea Symptoms: diarrhea 10x/day, vomiting EOD, 3x with blood, weakness and pale looking, abdominal pain comes and goes Frequency: 2 months Pertinent Negatives: NA Disposition: [] ED /[] Urgent Care (no appt availability in office) / [x] Appointment(In office/virtual)/ []  [facility]/ [] Home Care/ [] Refused Recommended Disposition /[]  Mobile Bus/ []  Follow-up with PCP Additional Notes: pt concerned about sx above. Has been affecting daily life and work. Offered OV today but pt declined, prefers to her PCP. Scheduled OV 08/03/23 with PCP. Advised of care advice and recommended if sx get worse before appt to go to [facility] or ED. Pt verbalized understanding.   Summary: diarrhea/vomiting blood   Patient called stated she has been experiencing ongoing diarrhea for with nausea, vomiting blood at times and a yellow pale look. Patient had to hurry off the phone, said she will call back and she never said if she was having any pain. Please f/u with patient         Reason for Disposition  [1] SEVERE diarrhea (e.g., 7 or more times / day more than normal) AND [2] present > 24 hours (1 day)  Answer Assessment - Initial Assessment Questions 1. DIARRHEA SEVERITY: "How bad is the diarrhea?" "How many more stools have you had in the past 24 hours than normal?"    - NO DIARRHEA (SCALE 0)   - MILD (SCALE 1-3): Few loose or mushy BMs; increase of 1-3 stools over normal daily number of stools; mild increase in ostomy output.   -  MODERATE (SCALE 4-7): Increase of 4-6 stools daily over normal; moderate increase in ostomy output.   -  SEVERE (SCALE 8-10; OR "WORST POSSIBLE"): Increase of 7 or more stools daily over normal; moderate increase in ostomy output; incontinence.     10x/ day 2. ONSET: "When did the diarrhea begin?"      2 months  3. BM CONSISTENCY: "How loose or watery is the diarrhea?"      yes 4. VOMITING: "Are you also vomiting?" If Yes, ask: "How many  times in the past 24 hours?"      Yes with blood 3x, every other day   5. ABDOMEN PAIN: "Are you having any abdomen pain?" If Yes, ask: "What does it feel like?" (e.g., crampy, dull, intermittent, constant)      Yes  6. ABDOMEN PAIN SEVERITY: If present, ask: "How bad is the pain?"  (e.g., Scale 1-10; mild, moderate, or severe)   - MILD (1-3): doesn't interfere with normal activities, abdomen soft and not tender to touch    - MODERATE (4-7): interferes with normal activities or awakens from sleep, abdomen tender to touch    - SEVERE (8-10): excruciating pain, doubled over, unable to do any normal activities       Mild to moderate  9. EXPOSURE: "Have you traveled to a foreign country recently?" "Have you been exposed to anyone with diarrhea?" "Could you have eaten any food that was spoiled?"     no 11. OTHER SYMPTOMS: "Do you have any other symptoms?" (e.g., fever, blood in stool)       Pale looking  Protocols used: Diarrhea-A-AH

## 2023-08-03 ENCOUNTER — Ambulatory Visit: Payer: Medicaid Other | Admitting: Family Medicine

## 2023-08-03 ENCOUNTER — Other Ambulatory Visit
Admission: RE | Admit: 2023-08-03 | Discharge: 2023-08-03 | Disposition: A | Discharge status: Home / Self Care | Payer: Medicaid Other | Attending: Family Medicine | Admitting: Family Medicine

## 2023-08-03 VITALS — BP 98/60 | HR 82 | Ht 61.0 in | Wt 146.0 lb

## 2023-08-03 DIAGNOSIS — R112 Nausea with vomiting, unspecified: Secondary | ICD-10-CM | POA: Diagnosis not present

## 2023-08-03 DIAGNOSIS — Z131 Encounter for screening for diabetes mellitus: Secondary | ICD-10-CM

## 2023-08-03 DIAGNOSIS — R197 Diarrhea, unspecified: Secondary | ICD-10-CM

## 2023-08-03 LAB — POCT URINALYSIS DIPSTICK
Bilirubin, UA: NEGATIVE
Blood, UA: NEGATIVE
Glucose, UA: NEGATIVE
Ketones, UA: NEGATIVE
Leukocytes, UA: NEGATIVE
Nitrite, UA: NEGATIVE
Protein, UA: NEGATIVE
Spec Grav, UA: 1.02 (ref 1.010–1.025)
Urobilinogen, UA: 0.2 U/dL
pH, UA: 7 (ref 5.0–8.0)

## 2023-08-03 LAB — CBC WITH DIFFERENTIAL/PLATELET
Abs Immature Granulocytes: 0.04 10*3/uL (ref 0.00–0.07)
Basophils Absolute: 0 10*3/uL (ref 0.0–0.1)
Basophils Relative: 1 %
Eosinophils Absolute: 0.2 10*3/uL (ref 0.0–0.5)
Eosinophils Relative: 3 %
HCT: 32.5 % — ABNORMAL LOW (ref 36.0–46.0)
Hemoglobin: 10.7 g/dL — ABNORMAL LOW (ref 12.0–15.0)
Immature Granulocytes: 1 %
Lymphocytes Relative: 29 %
Lymphs Abs: 1.8 10*3/uL (ref 0.7–4.0)
MCH: 25.7 pg — ABNORMAL LOW (ref 26.0–34.0)
MCHC: 32.9 g/dL (ref 30.0–36.0)
MCV: 77.9 fL — ABNORMAL LOW (ref 80.0–100.0)
Monocytes Absolute: 0.5 10*3/uL (ref 0.1–1.0)
Monocytes Relative: 8 %
Neutro Abs: 3.7 10*3/uL (ref 1.7–7.7)
Neutrophils Relative %: 58 %
Platelets: 291 10*3/uL (ref 150–400)
RBC: 4.17 MIL/uL (ref 3.87–5.11)
RDW: 15 % (ref 11.5–15.5)
WBC: 6.3 10*3/uL (ref 4.0–10.5)
nRBC: 0 % (ref 0.0–0.2)

## 2023-08-03 LAB — COMPREHENSIVE METABOLIC PANEL
ALT: 22 U/L (ref 0–44)
AST: 20 U/L (ref 15–41)
Albumin: 3.6 g/dL (ref 3.5–5.0)
Alkaline Phosphatase: 73 U/L (ref 38–126)
Anion gap: 10 (ref 5–15)
BUN: 9 mg/dL (ref 6–20)
CO2: 24 mmol/L (ref 22–32)
Calcium: 8 mg/dL — ABNORMAL LOW (ref 8.9–10.3)
Chloride: 101 mmol/L (ref 98–111)
Creatinine, Ser: 0.56 mg/dL (ref 0.44–1.00)
GFR, Estimated: >60 mL/min (ref >60)
Glucose, Bld: 84 mg/dL (ref 70–99)
Potassium: 3.4 mmol/L — ABNORMAL LOW (ref 3.5–5.1)
Sodium: 135 mmol/L (ref 135–145)
Total Bilirubin: 0.6 mg/dL (ref <1.2)
Total Protein: 7 g/dL (ref 6.5–8.1)

## 2023-08-03 LAB — TSH: TSH: 1.196 u[IU]/mL (ref 0.350–4.500)

## 2023-08-03 LAB — HEMOGLOBIN A1C
Hgb A1c MFr Bld: 5.5 % (ref 4.8–5.6)
Mean Plasma Glucose: 111.15 mg/dL

## 2023-08-03 LAB — POCT URINE PREGNANCY: Preg Test, Ur: NEGATIVE

## 2023-08-03 MED ORDER — ONDANSETRON 8 MG PO TBDP: 8.0000 mg | ORAL_TABLET | Freq: Three times a day (TID) | ORAL | 0 refills | Status: AC | PRN

## 2023-08-03 MED ORDER — PANTOPRAZOLE SODIUM 40 MG PO TBEC: 40.0000 mg | DELAYED_RELEASE_TABLET | Freq: Every day | ORAL | 0 refills | Status: DC

## 2023-08-03 MED ORDER — DICYCLOMINE HCL 10 MG PO CAPS: 10.0000 mg | ORAL_CAPSULE | Freq: Three times a day (TID) | ORAL | 1 refills | Status: DC

## 2023-08-06 ENCOUNTER — Encounter: Payer: Self-pay | Admitting: Family Medicine

## 2023-08-06 DIAGNOSIS — R197 Diarrhea, unspecified: Secondary | ICD-10-CM | POA: Insufficient documentation

## 2023-08-06 NOTE — Progress Notes (Signed)
Primary Care / Sports Medicine Office Visit  Patient Information:  Patient ID: Natasha Sloan, female DOB: 04-14-1991 Age: 32 y.o. MRN: [id number]   Natasha Sloan is a pleasant 32 y.o. female presenting with the following:  Chief Complaint  Patient presents with   Diarrhea    On going for a month and a half , stomach has been upset . No treatment, but patient has tried to change diet but it did not help.   Emesis    1.5 months stomach has been upset. No treatment. Patient has tried to change diet but has not helped     Vitals:   08/03/23 1604  BP: 98/60  Pulse: 82  SpO2: 99%   Vitals:   08/03/23 1604  Weight: 146 lb (66.2 kg)  Height: 5\' 1"  (1.549 m)   Body mass index is 27.59 kg/m.  No results found.   Independent interpretation of notes and tests performed by another provider:   None  Procedures performed:   None  Pertinent History, Exam, Impression, and Recommendations:   Problem List Items Addressed This Visit       Digestive   Nausea vomiting and diarrhea - Primary    History of Present Illness Natasha Sloan, recently married, presents with persistent nausea, vomiting, and diarrhea for the past one and a half months. Initially, she attributed these symptoms to nerves/stress related to her recent wedding, but symptoms have persisted for three weeks post-wedding. She experiences discomfort in her stomach, described as a 'turning' sensation, after eating or drinking, which often leads to a bowel movement. Her stools have been consistently loose. She has noticed blood and mucus in her vomit. She has also noticed possible blood in her stools, but she is unsure as she started her menstrual cycle around the same time.  She has experienced pain in her sides, which she describes as feeling 'swollen.'  We discussed the possibility of pregnancy as she is not currently on birth control. She has not noticed any changes in her appetite.  Physical Exam ABDOMEN: No abnormalities  to inspection, tenderness right upper quadrant with positive Murphy sign, nontender CVA, and epigastric region.  Normoactive bowel sounds.  POC urinalysis and urine hCG negative  Gastrointestinal Symptoms Persistent nausea, vomiting, and diarrhea for over a month. Patient reports possible hematemesis and hematochezia, but unsure if the latter was related to menstrual cycle. Physical examination revealed discomfort in the right upper quadrant, possibly indicative of gallbladder pathology.  Can consider IBS pending workup. -Order abdominal ultrasound to evaluate gallbladder. -Order liver function tests to assess for possible hepatobiliary disease.      Relevant Orders   Comprehensive metabolic panel   TSH   CBC with Differential/Platelet   POCT urinalysis dipstick (Completed)   POCT urine pregnancy (Completed)   Other Visit Diagnoses     Screening for diabetes mellitus       Relevant Orders   Hemoglobin A1c        Orders & Medications Medications:  Meds ordered this encounter  Medications   pantoprazole (PROTONIX) 40 MG tablet    Sig: Take 1 tablet (40 mg total) by mouth daily for 14 days. Take on empty stomach    Dispense:  14 tablet    Refill:  0   ondansetron (ZOFRAN-ODT) 8 MG disintegrating tablet    Sig: Take 1 tablet (8 mg total) by mouth every 8 (eight) hours as needed for up to 14 days for nausea.    Dispense:  20 tablet  Refill:  0   dicyclomine (BENTYL) 10 MG capsule    Sig: Take 1 capsule (10 mg total) by mouth 4 (four) times daily -  before meals and at bedtime for 15 days. As needed prior to meals to avoid abdominal symptoms    Dispense:  30 capsule    Refill:  1   Orders Placed This Encounter  Procedures   Comprehensive metabolic panel   TSH   CBC with Differential/Platelet   Hemoglobin A1c   POCT urinalysis dipstick   POCT urine pregnancy     No follow-ups on file.     Natasha Banana, MD, [facility]   Primary Care Sports Medicine Primary Care  and Sports Medicine at [facility]

## 2023-08-06 NOTE — Assessment & Plan Note (Signed)
History of Present Illness Natasha Sloan, recently married, presents with persistent nausea, vomiting, and diarrhea for the past one and a half months. Initially, she attributed these symptoms to nerves/stress related to her recent wedding, but symptoms have persisted for three weeks post-wedding. She experiences discomfort in her stomach, described as a 'turning' sensation, after eating or drinking, which often leads to a bowel movement. Her stools have been consistently loose. She has noticed blood and mucus in her vomit. She has also noticed possible blood in her stools, but she is unsure as she started her menstrual cycle around the same time.  She has experienced pain in her sides, which she describes as feeling 'swollen.'  We discussed the possibility of pregnancy as she is not currently on birth control. She has not noticed any changes in her appetite.  Physical Exam ABDOMEN: No abnormalities to inspection, tenderness right upper quadrant with positive Murphy sign, nontender CVA, and epigastric region.  Normoactive bowel sounds.  POC urinalysis and urine hCG negative  Gastrointestinal Symptoms Persistent nausea, vomiting, and diarrhea for over a month. Patient reports possible hematemesis and hematochezia, but unsure if the latter was related to menstrual cycle. Physical examination revealed discomfort in the right upper quadrant, possibly indicative of gallbladder pathology.  Can consider IBS pending workup. -Order abdominal ultrasound to evaluate gallbladder. -Order liver function tests to assess for possible hepatobiliary disease.

## 2023-08-10 NOTE — Progress Notes (Signed)
Please schedule pt an appointment.  KP

## 2023-08-12 NOTE — Progress Notes (Signed)
Please schedule pt an appointment.  KP

## 2023-08-20 DIAGNOSIS — H52223 Regular astigmatism, bilateral: Secondary | ICD-10-CM | POA: Diagnosis not present

## 2023-09-24 ENCOUNTER — Telehealth: Payer: Self-pay | Admitting: Family Medicine

## 2023-09-24 NOTE — Telephone Encounter (Signed)
Copied from CRM [id number]. Topic: General - Other >> Sep 24, 2023 11:47 AM Turkey B wrote: Reason for CRM: pt called in says loss previous lab orders, so needs new fu ordfers

## 2023-09-24 NOTE — Telephone Encounter (Signed)
Patient will pick up labs on Monday.

## 2023-09-28 ENCOUNTER — Ambulatory Visit: Payer: Medicaid Other | Admitting: Family Medicine

## 2023-09-29 ENCOUNTER — Other Ambulatory Visit
Admission: RE | Admit: 2023-09-29 | Discharge: 2023-09-29 | Disposition: A | Discharge status: Home / Self Care | Payer: Medicaid Other | Attending: Family Medicine | Admitting: Family Medicine

## 2023-09-29 DIAGNOSIS — R197 Diarrhea, unspecified: Secondary | ICD-10-CM | POA: Insufficient documentation

## 2023-09-29 DIAGNOSIS — Z131 Encounter for screening for diabetes mellitus: Secondary | ICD-10-CM | POA: Insufficient documentation

## 2023-09-29 DIAGNOSIS — R112 Nausea with vomiting, unspecified: Secondary | ICD-10-CM | POA: Diagnosis not present

## 2023-09-29 LAB — COMPREHENSIVE METABOLIC PANEL
ALT: 34 U/L (ref 0–44)
AST: 23 U/L (ref 15–41)
Albumin: 4.2 g/dL (ref 3.5–5.0)
Alkaline Phosphatase: 70 U/L (ref 38–126)
Anion gap: 7 (ref 5–15)
BUN: 11 mg/dL (ref 6–20)
CO2: 24 mmol/L (ref 22–32)
Calcium: 8.3 mg/dL — ABNORMAL LOW (ref 8.9–10.3)
Chloride: 100 mmol/L (ref 98–111)
Creatinine, Ser: 0.74 mg/dL (ref 0.44–1.00)
GFR, Estimated: >60 mL/min (ref >60)
Glucose, Bld: 94 mg/dL (ref 70–99)
Potassium: 3.6 mmol/L (ref 3.5–5.1)
Sodium: 131 mmol/L — ABNORMAL LOW (ref 135–145)
Total Bilirubin: 0.3 mg/dL (ref 0.0–1.2)
Total Protein: 7.7 g/dL (ref 6.5–8.1)

## 2023-09-29 LAB — CBC WITH DIFFERENTIAL/PLATELET
Abs Immature Granulocytes: 0.04 10*3/uL (ref 0.00–0.07)
Basophils Absolute: 0.1 10*3/uL (ref 0.0–0.1)
Basophils Relative: 1 %
Eosinophils Absolute: 0.2 10*3/uL (ref 0.0–0.5)
Eosinophils Relative: 4 %
HCT: 33.7 % — ABNORMAL LOW (ref 36.0–46.0)
Hemoglobin: 11.3 g/dL — ABNORMAL LOW (ref 12.0–15.0)
Immature Granulocytes: 1 %
Lymphocytes Relative: 34 %
Lymphs Abs: 1.9 10*3/uL (ref 0.7–4.0)
MCH: 26 pg (ref 26.0–34.0)
MCHC: 33.5 g/dL (ref 30.0–36.0)
MCV: 77.5 fL — ABNORMAL LOW (ref 80.0–100.0)
Monocytes Absolute: 0.6 10*3/uL (ref 0.1–1.0)
Monocytes Relative: 10 %
Neutro Abs: 2.8 10*3/uL (ref 1.7–7.7)
Neutrophils Relative %: 50 %
Platelets: 252 10*3/uL (ref 150–400)
RBC: 4.35 MIL/uL (ref 3.87–5.11)
RDW: 15.9 % — ABNORMAL HIGH (ref 11.5–15.5)
WBC: 5.6 10*3/uL (ref 4.0–10.5)
nRBC: 0 % (ref 0.0–0.2)

## 2023-09-29 LAB — HEMOGLOBIN A1C
Hgb A1c MFr Bld: 5.5 % (ref 4.8–5.6)
Mean Plasma Glucose: 111.15 mg/dL

## 2023-09-29 LAB — TSH: TSH: 1.629 u[IU]/mL (ref 0.350–4.500)

## 2023-09-30 ENCOUNTER — Ambulatory Visit: Payer: Medicaid Other | Admitting: Family Medicine

## 2023-10-01 ENCOUNTER — Ambulatory Visit: Payer: Medicaid Other | Admitting: Family Medicine

## 2023-10-04 ENCOUNTER — Ambulatory Visit: Payer: Medicaid Other | Admitting: Family Medicine

## 2023-10-06 DIAGNOSIS — J340 Abscess, furuncle and carbuncle of nose: Secondary | ICD-10-CM | POA: Diagnosis not present

## 2023-10-06 DIAGNOSIS — J3489 Other specified disorders of nose and nasal sinuses: Secondary | ICD-10-CM | POA: Diagnosis not present

## 2023-10-08 ENCOUNTER — Encounter: Payer: Self-pay | Admitting: Family Medicine

## 2023-10-11 ENCOUNTER — Ambulatory Visit: Payer: Medicaid Other | Admitting: Nurse Practitioner

## 2023-10-11 ENCOUNTER — Encounter: Payer: Self-pay | Admitting: Nurse Practitioner

## 2023-10-11 DIAGNOSIS — Z113 Encounter for screening for infections with a predominantly sexual mode of transmission: Secondary | ICD-10-CM | POA: Diagnosis not present

## 2023-10-11 LAB — WET PREP FOR TRICH, YEAST, CLUE
Trichomonas Exam: NEGATIVE
Yeast Exam: NEGATIVE

## 2023-10-11 NOTE — Progress Notes (Signed)
Pt is here for STD screening , Wet prep results reviewed no treatment required per provider. Condoms declined, patient given the opportunity to ask questions for any clarifications. Sonda Primes, RN

## 2023-10-12 ENCOUNTER — Ambulatory Visit: Payer: Medicaid Other | Admitting: Family Medicine

## 2023-10-12 VITALS — BP 120/70 | HR 93 | Ht 61.0 in | Wt 143.7 lb

## 2023-10-12 DIAGNOSIS — R197 Diarrhea, unspecified: Secondary | ICD-10-CM | POA: Diagnosis not present

## 2023-10-12 DIAGNOSIS — D649 Anemia, unspecified: Secondary | ICD-10-CM

## 2023-10-12 DIAGNOSIS — R112 Nausea with vomiting, unspecified: Secondary | ICD-10-CM

## 2023-10-12 DIAGNOSIS — J4540 Moderate persistent asthma, uncomplicated: Secondary | ICD-10-CM

## 2023-10-12 MED ORDER — FLUTICASONE-SALMETEROL 100-50 MCG/ACT IN AEPB: 1.0000 | INHALATION_SPRAY | Freq: Two times a day (BID) | RESPIRATORY_TRACT | 3 refills | Status: AC

## 2023-10-12 MED ORDER — ONDANSETRON 4 MG PO TBDP: 4.0000 mg | ORAL_TABLET | Freq: Three times a day (TID) | ORAL | 0 refills | Status: AC | PRN

## 2023-10-12 MED ORDER — ALBUTEROL SULFATE HFA 108 (90 BASE) MCG/ACT IN AERS: 2.0000 | INHALATION_SPRAY | Freq: Four times a day (QID) | RESPIRATORY_TRACT | 1 refills | Status: DC | PRN

## 2023-10-12 MED ORDER — AIRSUPRA 90-80 MCG/ACT IN AERO: 2.0000 | INHALATION_SPRAY | RESPIRATORY_TRACT | 1 refills | Status: DC

## 2023-10-12 MED ORDER — PANTOPRAZOLE SODIUM 40 MG PO TBEC: 40.0000 mg | DELAYED_RELEASE_TABLET | Freq: Every day | ORAL | 3 refills | Status: AC

## 2023-10-12 NOTE — Assessment & Plan Note (Signed)
She has a history of anemia, with recent lab results indicating low hemoglobin and hematocrit levels. She feels tired and exhausted, especially during physical activities. Her menstrual cycles have normalized over the past six months, now occurring monthly and lasting about five days, a change from previous abnormal cycles that lasted up to twenty days. Denies any known GI blood loss.  Anemia Low hemoglobin and hematocrit, given MCV, most likely due to iron deficiency. -Start iron supplement daily. -Repeat blood count in a few months to assess response to treatment.

## 2023-10-12 NOTE — Progress Notes (Signed)
Primary Care / Sports Medicine Office Visit  Patient Information:  Patient ID: Natasha Sloan, female DOB: 1990/11/13 Age: 33 y.o. MRN: [id number]   Natasha Sloan is a pleasant 33 y.o. female presenting with the following:  Chief Complaint  Patient presents with   lab review    Patient presents today to go over lab results.     Vitals:   10/12/23 1115  BP: 120/70  Pulse: 93  SpO2: 96%   Vitals:   10/12/23 1115  Weight: 143 lb 11.2 oz (65.2 kg)  Height: 5\' 1"  (1.549 m)   Body mass index is 27.15 kg/m.  No results found.   Independent interpretation of notes and tests performed by another provider:   None  Procedures performed:   None  Pertinent History, Exam, Impression, and Recommendations:   Problem List Items Addressed This Visit     Anemia - Primary   She has a history of anemia, with recent lab results indicating low hemoglobin and hematocrit levels. She feels tired and exhausted, especially during physical activities. Her menstrual cycles have normalized over the past six months, now occurring monthly and lasting about five days, a change from previous abnormal cycles that lasted up to twenty days. Denies any known GI blood loss.  Anemia Low hemoglobin and hematocrit, given MCV, most likely due to iron deficiency. -Start iron supplement daily. -Repeat blood count in a few months to assess response to treatment.      Relevant Orders   CBC with Differential/Platelet   Iron, TIBC and Ferritin Panel   Reticulocytes   Asthma   PRN use of an albuterol inhaler, which is effective. Oxygen saturation is slightly decreased despite recent course of prednisone.  Asthma Rhonchi noted on examination, possible cough variant asthma. -Continue Albuterol inhaler as needed. -See if we can obtain coverage for AirSupra instead, patient knows to only use one as rescue.  If not covered, continue with Albuterol. -Start Advair twice daily.      Relevant Medications    Albuterol-Budesonide (AIRSUPRA) 90-80 MCG/ACT AERO   albuterol (VENTOLIN HFA) 108 (90 Base) MCG/ACT inhaler   fluticasone-salmeterol (ADVAIR) 100-50 MCG/ACT AEPB   Nausea vomiting and diarrhea   She has been experiencing gastrointestinal symptoms, including nausea and diarrhea. The diarrhea has improved since the last visit, now occurring about three times last week and not associated with specific foods. Nausea is primarily experienced at work, subsiding somewhat after eating lunch but followed by diarrhea. Occasional morning nausea occurs, particularly when brushing her teeth, but there is no vomiting. She uses Tums occasionally, which provides some relief.  Physical Exam VITALS: SaO2- 96% CHEST: Coarse rhonchi throughout all fields.  ABDOMEN: Negative Murphy sign. Normoactive active bowel sounds, soft, minimally tender right upper quadrant. No masses.  Gastrointestinal symptoms Nausea and diarrhea, possibly related to work stress and food intake. Possible duodenal ulcer or IBS-D. -Start Pantoprazole once daily in the morning. -Consider tracking food intake and symptoms to identify potential triggers. -Continue use of Tums as needed. -Consider starting a multivitamin with calcium.      Relevant Medications   pantoprazole (PROTONIX) 40 MG tablet   ondansetron (ZOFRAN-ODT) 4 MG disintegrating tablet   Other Relevant Orders   Lipase   Amylase   Comprehensive metabolic panel     Orders & Medications Medications:  Meds ordered this encounter  Medications   Albuterol-Budesonide (AIRSUPRA) 90-80 MCG/ACT AERO    Sig: Inhale 2 Inhalations into the lungs as directed. Two inhalations every 20 minutes  as needed for up to 3 doses (6 inhalations total); then may administer 2 inhalations every 1 to 4 hours as needed. Do not exceed 12 inhalations in a 24-hour period.    Dispense:  10.7 g    Refill:  1   albuterol (VENTOLIN HFA) 108 (90 Base) MCG/ACT inhaler    Sig: Inhale 2 puffs into the  lungs every 6 (six) hours as needed for wheezing or shortness of breath.    Dispense:  6.7 g    Refill:  1    Courtesy refill. Pt will need an office visit for further refills.   pantoprazole (PROTONIX) 40 MG tablet    Sig: Take 1 tablet (40 mg total) by mouth daily.    Dispense:  30 tablet    Refill:  3   fluticasone-salmeterol (ADVAIR) 100-50 MCG/ACT AEPB    Sig: Inhale 1 puff into the lungs 2 (two) times daily.    Dispense:  1 each    Refill:  3   ondansetron (ZOFRAN-ODT) 4 MG disintegrating tablet    Sig: Take 1 tablet (4 mg total) by mouth every 8 (eight) hours as needed for up to 10 days for nausea or vomiting.    Dispense:  20 tablet    Refill:  0   Orders Placed This Encounter  Procedures   CBC with Differential/Platelet   Iron, TIBC and Ferritin Panel   Lipase   Amylase   Reticulocytes   Comprehensive metabolic panel     Return in about 3 months (around 01/09/2024) for follow-up.     Jerrol Banana, MD, [facility]   Primary Care Sports Medicine Primary Care and Sports Medicine at [facility]

## 2023-10-12 NOTE — Patient Instructions (Signed)
Patient Plan  Anemia: - Start iron supplement daily. - Repeat blood count in a few months prior to your follow-up.  Asthma: - Continue Albuterol inhaler as needed. - Attempt to obtain coverage for AirSupra; if not covered, continue with Albuterol. Use only one or the other. - Start Advair inhaler twice daily.  Gastrointestinal Symptoms: - Start Pantoprazole once daily in the morning on an empty stomach. - Consider tracking food intake and symptoms. - Continue use of Tums as needed. - Consider starting a multivitamin with calcium.  - Return in 3 months, obtain labs a few days prior so that we can review them at the time of your visit.

## 2023-10-12 NOTE — Assessment & Plan Note (Signed)
She has been experiencing gastrointestinal symptoms, including nausea and diarrhea. The diarrhea has improved since the last visit, now occurring about three times last week and not associated with specific foods. Nausea is primarily experienced at work, subsiding somewhat after eating lunch but followed by diarrhea. Occasional morning nausea occurs, particularly when brushing her teeth, but there is no vomiting. She uses Tums occasionally, which provides some relief.  Physical Exam VITALS: SaO2- 96% CHEST: Coarse rhonchi throughout all fields.  ABDOMEN: Negative Murphy sign. Normoactive active bowel sounds, soft, minimally tender right upper quadrant. No masses.  Gastrointestinal symptoms Nausea and diarrhea, possibly related to work stress and food intake. Possible duodenal ulcer or IBS-D. -Start Pantoprazole once daily in the morning. -Consider tracking food intake and symptoms to identify potential triggers. -Continue use of Tums as needed. -Consider starting a multivitamin with calcium.

## 2023-10-12 NOTE — Assessment & Plan Note (Signed)
PRN use of an albuterol inhaler, which is effective. Oxygen saturation is slightly decreased despite recent course of prednisone.  Asthma Rhonchi noted on examination, possible cough variant asthma. -Continue Albuterol inhaler as needed. -See if we can obtain coverage for AirSupra instead, patient knows to only use one as rescue.  If not covered, continue with Albuterol. -Start Advair twice daily.

## 2023-10-14 ENCOUNTER — Encounter: Payer: Self-pay | Admitting: Nurse Practitioner

## 2023-10-14 NOTE — Progress Notes (Signed)
[facility] Department STI clinic [address] [place] [address] Main phone: [phone]  STI screening visit  Subjective:  Natasha Sloan is a 33 y.o. female being seen today for an STI screening visit. The patient reports they do not have symptoms.  Patient reports that they do not desire a pregnancy in the next year.   They reported they are not interested in discussing contraception today.    Patient's last menstrual period was 09/19/2023.  Patient has the following medical conditions:  Patient Active Problem List   Diagnosis Date Noted   Anemia 10/12/2023   Nausea vomiting and diarrhea 08/06/2023   Healthcare maintenance 02/18/2023   Allergic rhinitis 01/19/2023   R/O Posttraumatic stress disorder 03/29/2018   Asthma 09/09/2011    Chief Complaint  Patient presents with   SEXUALLY TRANSMITTED DISEASE    Pt is here for STD screening and has no symptoms   Patient reports to the clinic for asymptomatic STI screening because she has a new partner. Patient indicates 2 female partners in the last 2 months, practices vaginal and oral sex, does not use condoms, and has a history of chlamydia. She reports last sex was 3 days prior without a condom. LMP 09/19/23 and reports they are monthly. Last PAP 2024: NILM and 2019: Negative.   Does the patient using douching products? Not assessed.   Last HIV test per patient/review of record was  Lab Results  Component Value Date   HMHIVSCREEN Negative - Validated 04/19/2023    Lab Results  Component Value Date   HIV Non Reactive 09/27/2020     Last HEPC test per patient/review of record was No results found for: "HMHEPCSCREEN" No components found for: "HEPC"   Last HEPB test per patient/review of record was No components found for: "HMHEPBSCREEN"   Patient reports last pap was:  No results found for: "DIAGPAP", "HPVHIGH", "ADEQPAP" Lab Results  Component Value Date   SPECADGYN Comment 04/19/2023    Result Date Procedure Results Follow-ups  04/19/2023 IGP, rfx Aptima HPV ASCU DIAGNOSIS:: Comment Specimen adequacy:: Comment Clinician Provided ICD10: Comment Performed by:: Comment PAP Smear Comment: . Note:: Comment Test Methodology: Comment PAP Reflex: Comment   03/18/2018 HM PAP SMEAR HM Pap smear: Negative     Screening for MPX risk: Does the patient have an unexplained rash? No Is the patient MSM? No Does the patient endorse multiple sex partners or anonymous sex partners? Yes Did the patient have close or sexual contact with a person diagnosed with MPX? No Has the patient traveled outside the [place] where MPX is endemic? No Is there a high clinical suspicion for MPX-- evidenced by one of the following No  -Unlikely to be chickenpox  -Lymphadenopathy  -Rash that present in same phase of evolution on any given body part See flowsheet for further details and programmatic requirements.   Immunization history:  Immunization History  Administered Date(s) Administered   Influenza,inj,Quad PF,6+ Mos 05/30/2021   MMR 03/14/2021   Pneumococcal Polysaccharide-23 05/30/2021   Tdap 12/30/2020     The following portions of the patient's history were reviewed and updated as appropriate: allergies, current medications, past medical history, past social history, past surgical history and problem list.  Objective:  There were no vitals filed for this visit.  Physical Exam Nursing note reviewed.  Constitutional:      Appearance: Normal appearance.  HENT:     Head: Normocephalic.     Salivary Glands: Right salivary gland is not diffusely  enlarged or tender. Left salivary gland is not diffusely enlarged or tender.     Mouth/Throat:     Lips: Pink. No lesions.     Mouth: Mucous membranes are moist.     Tongue: No lesions. Tongue does not deviate from midline.     Pharynx: Oropharynx is clear. Uvula midline. No oropharyngeal exudate or posterior oropharyngeal erythema.     Tonsils:  No tonsillar exudate.  Eyes:     General:        Right eye: No discharge.        Left eye: No discharge.  Pulmonary:     Effort: Pulmonary effort is normal.  Genitourinary:    Comments: Patient asymptomatic. Declines genital exam. Self-swabbing.  Lymphadenopathy:     Head:     Right side of head: No submental, submandibular, tonsillar, preauricular or posterior auricular adenopathy.     Left side of head: No submental, submandibular, tonsillar, preauricular or posterior auricular adenopathy.     Cervical: No cervical adenopathy.     Right cervical: No superficial or posterior cervical adenopathy.    Left cervical: No superficial or posterior cervical adenopathy.     Upper Body:     Right upper body: No supraclavicular or axillary adenopathy.     Left upper body: No supraclavicular or axillary adenopathy.  Skin:    General: Skin is warm and dry.     Comments: Skin tone appropriate for ethnicity. Assessed exposed areas only and back.   Neurological:     Mental Status: She is alert and oriented to person, place, and time.  Psychiatric:        Attention and Perception: Attention and perception normal.        Mood and Affect: Mood and affect normal.        Speech: Speech normal.        Behavior: Behavior normal. Behavior is cooperative.        Thought Content: Thought content normal.     Assessment and Plan:  [name] is a 33 y.o. female presenting to the [facility] Department for STI screening  1. Screening examination for venereal disease (Primary) Patient originally requested and agreed to STI blood work but left prior to labs being drawn.  - Chlamydia/Gonorrhea [facility] - WET PREP FOR TRICH, YEAST, CLUE - Gonococcus culture  Patient accepted all screenings including oral, vaginal CT/GC, and wet prep. Patient meets criteria for HepB screening? Yes. Ordered? No-Patient originally agreed but left before blood draw citing time constraint to lab tech.   Patient meets criteria for HepC screening? Yes. Ordered? No-Patient originally agreed but left before blood draw citing time constraint to lab tech.  Treat wet prep per standing order Discussed time line for [facility] results and that patient will be called with positive results and encouraged patient to call if she had not heard in 2 weeks.  Counseled to return or seek care for continued or worsening symptoms Recommended repeat testing in 3 months with positive results. Recommended condom use with all sex for STI prevention.   Patient is currently using  nothing  to prevent pregnancy.    Return if symptoms worsen or fail to improve.  Future Appointments  Date Time Provider Department Center  01/10/2024  1:40 PM Jerrol Banana, MD [facility]   Total time with patient 20 minutes.   Edmonia James, NP

## 2023-10-16 LAB — GONOCOCCUS CULTURE

## 2023-10-19 ENCOUNTER — Other Ambulatory Visit: Payer: Self-pay

## 2023-10-19 DIAGNOSIS — J4540 Moderate persistent asthma, uncomplicated: Secondary | ICD-10-CM

## 2023-10-19 MED ORDER — ALBUTEROL SULFATE HFA 108 (90 BASE) MCG/ACT IN AERS: 2.0000 | INHALATION_SPRAY | Freq: Four times a day (QID) | RESPIRATORY_TRACT | 1 refills | Status: DC | PRN

## 2023-10-19 MED ORDER — AIRSUPRA 90-80 MCG/ACT IN AERO: 2.0000 | INHALATION_SPRAY | RESPIRATORY_TRACT | 1 refills | Status: DC

## 2023-10-25 ENCOUNTER — Telehealth: Payer: Self-pay

## 2023-10-26 NOTE — Telephone Encounter (Signed)
 Patient has medicaid a PA will not be processed. JM

## 2023-11-01 DIAGNOSIS — J340 Abscess, furuncle and carbuncle of nose: Secondary | ICD-10-CM | POA: Diagnosis not present

## 2023-11-01 DIAGNOSIS — J45991 Cough variant asthma: Secondary | ICD-10-CM | POA: Diagnosis not present

## 2023-11-01 DIAGNOSIS — J309 Allergic rhinitis, unspecified: Secondary | ICD-10-CM | POA: Diagnosis not present

## 2023-11-01 DIAGNOSIS — R0981 Nasal congestion: Secondary | ICD-10-CM | POA: Diagnosis not present

## 2023-12-22 DIAGNOSIS — J301 Allergic rhinitis due to pollen: Secondary | ICD-10-CM | POA: Diagnosis not present

## 2023-12-30 DIAGNOSIS — J301 Allergic rhinitis due to pollen: Secondary | ICD-10-CM | POA: Diagnosis not present

## 2024-01-10 ENCOUNTER — Ambulatory Visit: Payer: Medicaid Other | Admitting: Family Medicine

## 2024-01-12 DIAGNOSIS — R0981 Nasal congestion: Secondary | ICD-10-CM | POA: Diagnosis not present

## 2024-01-12 DIAGNOSIS — J309 Allergic rhinitis, unspecified: Secondary | ICD-10-CM | POA: Diagnosis not present

## 2024-01-17 ENCOUNTER — Encounter: Payer: Self-pay | Admitting: *Deleted

## 2024-01-17 ENCOUNTER — Ambulatory Visit: Admitting: Family Medicine

## 2024-04-05 DIAGNOSIS — J309 Allergic rhinitis, unspecified: Secondary | ICD-10-CM | POA: Diagnosis not present

## 2024-06-01 ENCOUNTER — Emergency Department

## 2024-06-01 ENCOUNTER — Emergency Department
Admission: EM | Admit: 2024-06-01 | Discharge: 2024-06-01 | Disposition: A | Discharge status: Home / Self Care | Attending: Emergency Medicine | Admitting: Emergency Medicine

## 2024-06-01 ENCOUNTER — Encounter: Payer: Self-pay | Admitting: Student

## 2024-06-01 ENCOUNTER — Other Ambulatory Visit: Payer: Self-pay

## 2024-06-01 DIAGNOSIS — R109 Unspecified abdominal pain: Secondary | ICD-10-CM

## 2024-06-01 DIAGNOSIS — Z3A00 Weeks of gestation of pregnancy not specified: Secondary | ICD-10-CM | POA: Insufficient documentation

## 2024-06-01 DIAGNOSIS — R1032 Left lower quadrant pain: Secondary | ICD-10-CM | POA: Insufficient documentation

## 2024-06-01 DIAGNOSIS — Z3A01 Less than 8 weeks gestation of pregnancy: Secondary | ICD-10-CM | POA: Diagnosis not present

## 2024-06-01 DIAGNOSIS — O26891 Other specified pregnancy related conditions, first trimester: Secondary | ICD-10-CM | POA: Insufficient documentation

## 2024-06-01 LAB — URINALYSIS, ROUTINE W REFLEX MICROSCOPIC
Bacteria, UA: NONE SEEN
Bilirubin Urine: NEGATIVE
Glucose, UA: NEGATIVE mg/dL
Hgb urine dipstick: NEGATIVE
Ketones, ur: NEGATIVE mg/dL
Nitrite: NEGATIVE
Protein, ur: NEGATIVE mg/dL
Specific Gravity, Urine: 1.026 (ref 1.005–1.030)
pH: 6 (ref 5.0–8.0)

## 2024-06-01 LAB — CBC WITH DIFFERENTIAL/PLATELET
Abs Immature Granulocytes: 0.02 K/uL (ref 0.00–0.07)
Basophils Absolute: 0 K/uL (ref 0.0–0.1)
Basophils Relative: 0 %
Eosinophils Absolute: 0.3 K/uL (ref 0.0–0.5)
Eosinophils Relative: 4 %
HCT: 36.2 % (ref 36.0–46.0)
Hemoglobin: 12.2 g/dL (ref 12.0–15.0)
Immature Granulocytes: 0 %
Lymphocytes Relative: 27 %
Lymphs Abs: 1.6 K/uL (ref 0.7–4.0)
MCH: 28.3 pg (ref 26.0–34.0)
MCHC: 33.7 g/dL (ref 30.0–36.0)
MCV: 84 fL (ref 80.0–100.0)
Monocytes Absolute: 0.8 K/uL (ref 0.1–1.0)
Monocytes Relative: 13 %
Neutro Abs: 3.3 K/uL (ref 1.7–7.7)
Neutrophils Relative %: 56 %
Platelets: 272 K/uL (ref 150–400)
RBC: 4.31 MIL/uL (ref 3.87–5.11)
RDW: 14.2 % (ref 11.5–15.5)
WBC: 6 K/uL (ref 4.0–10.5)
nRBC: 0 % (ref 0.0–0.2)

## 2024-06-01 LAB — HCG, QUANTITATIVE, PREGNANCY: hCG, Beta Chain, Quant, S: 307 m[IU]/mL — ABNORMAL HIGH (ref <5)

## 2024-06-01 LAB — POC URINE PREG, ED: Preg Test, Ur: POSITIVE — AB

## 2024-06-01 NOTE — ED Provider Notes (Signed)
 [facility] Provider Note    Event Date/Time   First MD Initiated Contact with Patient 06/01/24 2057     (approximate)   History   Abdominal Pain (Pregnant/)   HPI  Natasha Sloan is a 33 y.o. female [id number] who presents for evaluation of LLQ abdominal pain. Patient was sent over from UC to rule out ectopic pregnancy. Patient states she has had some discomfort for the past 3 days. She had some light vaginal bleeding this morning. No abnormal vaginal discharge. She found out she was pregnant today.  Patient endorses very irregular.  And she is not sure when her last menstrual period was.      Physical Exam   Triage Vital Signs: ED Triage Vitals  Encounter Vitals Group     BP 06/01/24 1943 127/77     Girls Systolic BP Percentile --      Girls Diastolic BP Percentile --      Boys Systolic BP Percentile --      Boys Diastolic BP Percentile --      Pulse Rate 06/01/24 1943 93     Resp 06/01/24 1943 18     Temp 06/01/24 1943 97.7 F (36.5 C)     Temp Source 06/01/24 1943 Oral     SpO2 06/01/24 1943 97 %     Weight --      Height --      Head Circumference --      Peak Flow --      Pain Score 06/01/24 1942 8     Pain Loc --      Pain Education --      Exclude from Growth Chart --     Most recent vital signs: Vitals:   06/01/24 1943  BP: 127/77  Pulse: 93  Resp: 18  Temp: 97.7 F (36.5 C)  SpO2: 97%   General: Awake, no distress.  CV:  Good peripheral perfusion. RRR. Resp:  Normal effort. CTAB. Abd:  No distention. Soft, mild TTP in LLQ.  Other:     ED Results / Procedures / Treatments   Labs (all labs ordered are listed, but only abnormal results are displayed) Labs Reviewed  URINALYSIS, ROUTINE W REFLEX MICROSCOPIC - Abnormal; Notable for the following components:      Result Value   Color, Urine YELLOW (*)    APPearance HAZY (*)    Leukocytes,Ua TRACE (*)    All other components within normal limits  HCG, QUANTITATIVE,  PREGNANCY - Abnormal; Notable for the following components:   hCG, Beta Chain, Quant, S 307 (*)    All other components within normal limits  POC URINE PREG, ED - Abnormal; Notable for the following components:   Preg Test, Ur Positive (*)    All other components within normal limits  CBC WITH DIFFERENTIAL/PLATELET    RADIOLOGY  The ultrasound obtained, interpreted the images as well as reviewed the radiologist report which showed pregnancy of unknown location at the current beta-hCG level.  They did not see intrauterine gestational sac, yolk sac or embryo.  Recommended serial beta hCG measurements and follow-up ultrasound to document IUP and exclude ectopic.   PROCEDURES:  Critical Care performed: No  Procedures   MEDICATIONS ORDERED IN ED: Medications - No data to display   IMPRESSION / MDM / ASSESSMENT AND PLAN / ED COURSE  I reviewed the triage vital signs and the nursing notes.  33 year old female presents for evaluation of left lower quadrant pain.  Vital signs are stable patient NAD on exam.  Differential diagnosis includes, but is not limited to, ectopic pregnancy, ovarian cyst, ovarian torsion, miscarriage, constipation, muscle strain.  Patient's presentation is most consistent with acute complicated illness / injury requiring diagnostic workup.  CBC is unremarkable.  Urinalysis shows trace leukocytes but is otherwise normal.  Pregnancy test is positive and beta-hCG is 307.  On exam patient is very mildly tender to palpation in the left lower quadrant but states she does not have any pain and feels fine to go home.  Ultrasound showed pregnancy of unknown location at the current beta-hCG level.  Did not see intrauterine gestational sac, yolk sac or embryo.  This is expected given the current beta-hCG level.    Very low suspicion for ovarian torsion as patient's pain is minimal and she did not have any ovarian cysts on her left ovary.  My  suspicion is that based on the beta-hCG patient is very early in her pregnancy.  I believe the small amount of bleeding she had was implantation bleeding but may also be signs of a miscarriage.  I emphasized the importance of the patient of getting a repeat beta drawn in 48 hours, as this will give us  a better understanding of what is going on.  I explained that if the beta is doubling her pregnancy is progressing as expected but if it is not increasing she may be having a miscarriage.  I explained that she will need to have another ultrasound done to confirm an intrauterine pregnancy.  She was provided with follow-up information for OB.  Patient voiced understanding, all questions were answered and she was stable at discharge.     FINAL CLINICAL IMPRESSION(S) / ED DIAGNOSES   Final diagnoses:  Abdominal pain during pregnancy in first trimester     Rx / DC Orders   ED Discharge Orders     None        Note:  This document was prepared using Dragon voice recognition software and may include unintentional dictation errors.   Cleaster Tinnie LABOR, PA-C 06/01/24 2152    Jossie Artist POUR, MD 06/01/24 2240

## 2024-06-01 NOTE — ED Notes (Signed)
 Green and lav top and urine collected and sent to lab

## 2024-06-01 NOTE — ED Triage Notes (Addendum)
 Pt arrives Pov, ambulatory to triage, no acute distress noted c/o left lower abd pain. Pt was sent by UC to r/o ectopic, unsure gestation age, pt found out she was pregnant today. Pt reports light vaginal bleeding that started this morning. G-4 P-2. LMP sometime in September.

## 2024-06-01 NOTE — ED Notes (Signed)
 Patient transported to Ultrasound

## 2024-06-01 NOTE — ED Notes (Signed)
Patient transported from US

## 2024-06-01 NOTE — Discharge Instructions (Addendum)
 Based on your beta-hCG today you are likely only a few weeks pregnant.  Because the beta-hCG was around 300 we did not see evidence of pregnancy on the ultrasound.  We were unable to confirm whether the pregnancy was within the uterus versus an ectopic because it is too early to tell.  It is very important that you have your beta-hCG drawn again in 48 hours.  If it is increasing then your pregnancy is progressing as expected.  If it is not then you may be having a miscarriage.  You will also need to have another ultrasound to confirm an intrauterine pregnancy.  All of this can be coordinated by the OB/GYN.  I have attached information for 1 you can follow-up with.  Please call to schedule.  Return to the emergency department with any worsening symptoms.

## 2024-07-11 DIAGNOSIS — R059 Cough, unspecified: Secondary | ICD-10-CM | POA: Diagnosis not present

## 2024-07-11 DIAGNOSIS — M25511 Pain in right shoulder: Secondary | ICD-10-CM | POA: Diagnosis not present

## 2024-07-11 DIAGNOSIS — S46911A Strain of unspecified muscle, fascia and tendon at shoulder and upper arm level, right arm, initial encounter: Secondary | ICD-10-CM | POA: Diagnosis not present

## 2024-07-11 DIAGNOSIS — J329 Chronic sinusitis, unspecified: Secondary | ICD-10-CM | POA: Diagnosis not present

## 2024-09-15 ENCOUNTER — Ambulatory Visit

## 2024-09-25 ENCOUNTER — Ambulatory Visit

## 2024-10-06 ENCOUNTER — Ambulatory Visit: Admitting: Family Medicine

## 2024-10-06 ENCOUNTER — Encounter: Payer: Self-pay | Admitting: Family Medicine

## 2024-10-06 ENCOUNTER — Other Ambulatory Visit: Payer: Self-pay | Admitting: Family Medicine

## 2024-10-06 VITALS — BP 90/58 | HR 80 | Ht 61.0 in | Wt 138.0 lb

## 2024-10-06 DIAGNOSIS — R051 Acute cough: Secondary | ICD-10-CM

## 2024-10-06 LAB — POCT INFLUENZA A/B
Influenza A, POC: NEGATIVE
Influenza B, POC: NEGATIVE

## 2024-10-06 LAB — POC COVID19 BINAXNOW: SARS Coronavirus 2 Ag: NEGATIVE

## 2024-10-06 MED ORDER — MONTELUKAST SODIUM 10 MG PO TABS: 10.0000 mg | ORAL_TABLET | Freq: Every day | ORAL | 0 refills | Status: AC

## 2024-10-06 MED ORDER — HYDROXYZINE PAMOATE 25 MG PO CAPS: 25.0000 mg | ORAL_CAPSULE | Freq: Three times a day (TID) | ORAL | 1 refills | Status: AC | PRN

## 2024-10-06 MED ORDER — TRIAMCINOLONE ACETONIDE 0.1 % EX CREA: 1.0000 | TOPICAL_CREAM | Freq: Two times a day (BID) | CUTANEOUS | 0 refills | Status: AC

## 2024-10-06 MED ORDER — METHYLPREDNISOLONE 4 MG PO TBPK: ORAL_TABLET | ORAL | 0 refills | Status: AC

## 2024-10-09 ENCOUNTER — Ambulatory Visit

## 2024-10-10 ENCOUNTER — Ambulatory Visit: Admitting: Family Medicine

## 2024-10-17 ENCOUNTER — Ambulatory Visit

## 2024-10-26 ENCOUNTER — Encounter: Admitting: Family Medicine
